# Patient Record
Sex: Male | Born: 1960 | Race: Black or African American | Hispanic: No | Marital: Single | Smoking: Current every day smoker
Health system: Southern US, Community
[De-identification: ages and names within clinical notes are randomized; demographics above are authoritative.]

## PROBLEM LIST (undated history)

## (undated) ENCOUNTER — Ambulatory Visit: Payer: MEDICAID | Attending: Pharmacist | Primary: Pharmacist

## (undated) ENCOUNTER — Telehealth

## (undated) ENCOUNTER — Encounter

## (undated) ENCOUNTER — Encounter
Attending: Student in an Organized Health Care Education/Training Program | Primary: Student in an Organized Health Care Education/Training Program

## (undated) ENCOUNTER — Telehealth: Attending: Clinical | Primary: Clinical

## (undated) ENCOUNTER — Ambulatory Visit: Payer: MEDICAID | Attending: Psychiatry | Primary: Psychiatry

## (undated) ENCOUNTER — Ambulatory Visit

## (undated) ENCOUNTER — Ambulatory Visit: Payer: MEDICAID | Attending: Infectious Disease | Primary: Infectious Disease

## (undated) ENCOUNTER — Ambulatory Visit: Payer: MEDICAID | Attending: Specialist | Primary: Specialist

## (undated) ENCOUNTER — Telehealth: Attending: Family Medicine | Primary: Family Medicine

## (undated) ENCOUNTER — Ambulatory Visit: Payer: MEDICAID | Attending: Family Medicine | Primary: Family Medicine

## (undated) ENCOUNTER — Other Ambulatory Visit

## (undated) ENCOUNTER — Encounter: Attending: Medical | Primary: Medical

## (undated) ENCOUNTER — Ambulatory Visit: Payer: MEDICAID | Attending: Family | Primary: Family

## (undated) ENCOUNTER — Ambulatory Visit: Payer: Medicare (Managed Care)

## (undated) ENCOUNTER — Ambulatory Visit: Payer: MEDICARE

## (undated) ENCOUNTER — Telehealth: Attending: Registered" | Primary: Registered"

## (undated) ENCOUNTER — Ambulatory Visit: Payer: MEDICAID | Attending: Medical | Primary: Medical

## (undated) ENCOUNTER — Encounter: Attending: Audiologist | Primary: Audiologist

## (undated) ENCOUNTER — Telehealth
Attending: Student in an Organized Health Care Education/Training Program | Primary: Student in an Organized Health Care Education/Training Program

## (undated) ENCOUNTER — Ambulatory Visit: Attending: Otolaryngology | Primary: Otolaryngology

## (undated) ENCOUNTER — Ambulatory Visit: Attending: Medical | Primary: Medical

## (undated) ENCOUNTER — Ambulatory Visit
Payer: MEDICARE | Attending: Student in an Organized Health Care Education/Training Program | Primary: Student in an Organized Health Care Education/Training Program

## (undated) ENCOUNTER — Encounter: Attending: Family Medicine | Primary: Family Medicine

## (undated) ENCOUNTER — Telehealth: Attending: Specialist | Primary: Specialist

## (undated) ENCOUNTER — Ambulatory Visit: Payer: MEDICAID | Attending: General Practice | Primary: General Practice

## (undated) ENCOUNTER — Ambulatory Visit: Payer: MEDICARE | Attending: Audiologist | Primary: Audiologist

## (undated) ENCOUNTER — Encounter: Attending: Otolaryngology | Primary: Otolaryngology

## (undated) ENCOUNTER — Encounter: Attending: Clinical | Primary: Clinical

## (undated) ENCOUNTER — Ambulatory Visit: Attending: Family | Primary: Family

## (undated) ENCOUNTER — Encounter: Attending: Specialist | Primary: Specialist

## (undated) ENCOUNTER — Ambulatory Visit: Attending: Pharmacist | Primary: Pharmacist

## (undated) ENCOUNTER — Ambulatory Visit: Payer: MEDICAID

## (undated) ENCOUNTER — Ambulatory Visit
Payer: MEDICAID | Attending: Student in an Organized Health Care Education/Training Program | Primary: Student in an Organized Health Care Education/Training Program

## (undated) ENCOUNTER — Ambulatory Visit: Payer: Medicare (Managed Care) | Attending: Anesthesiology | Primary: Anesthesiology

## (undated) ENCOUNTER — Ambulatory Visit: Attending: Audiologist | Primary: Audiologist

## (undated) ENCOUNTER — Ambulatory Visit: Payer: Medicare (Managed Care) | Attending: Family Medicine | Primary: Family Medicine

## (undated) ENCOUNTER — Ambulatory Visit
Payer: Medicare (Managed Care) | Attending: Student in an Organized Health Care Education/Training Program | Primary: Student in an Organized Health Care Education/Training Program

## (undated) ENCOUNTER — Ambulatory Visit: Attending: Registered" | Primary: Registered"

## (undated) ENCOUNTER — Telehealth: Attending: Medical | Primary: Medical

## (undated) ENCOUNTER — Ambulatory Visit: Payer: MEDICAID | Attending: Emergency Medicine | Primary: Emergency Medicine

## (undated) ENCOUNTER — Ambulatory Visit: Attending: Infectious Disease | Primary: Infectious Disease

## (undated) ENCOUNTER — Ambulatory Visit: Attending: Emergency Medicine | Primary: Emergency Medicine

## (undated) ENCOUNTER — Ambulatory Visit
Attending: Student in an Organized Health Care Education/Training Program | Primary: Student in an Organized Health Care Education/Training Program

## (undated) ENCOUNTER — Ambulatory Visit: Payer: MEDICARE | Attending: Registered" | Primary: Registered"

## (undated) ENCOUNTER — Encounter: Attending: Infectious Disease | Primary: Infectious Disease

## (undated) ENCOUNTER — Ambulatory Visit: Attending: Mental Health | Primary: Mental Health

## (undated) ENCOUNTER — Telehealth: Attending: Pharmacist | Primary: Pharmacist

## (undated) ENCOUNTER — Ambulatory Visit: Attending: Family Medicine | Primary: Family Medicine

## (undated) ENCOUNTER — Telehealth: Attending: Anesthesiology | Primary: Anesthesiology

## (undated) ENCOUNTER — Ambulatory Visit: Payer: MEDICAID | Attending: Clinical | Primary: Clinical

## (undated) ENCOUNTER — Encounter: Attending: Adult Health | Primary: Adult Health

## (undated) DIAGNOSIS — F329 Major depressive disorder, single episode, unspecified: Secondary | ICD-10-CM

## (undated) DIAGNOSIS — R569 Unspecified convulsions: Secondary | ICD-10-CM

## (undated) DIAGNOSIS — M1990 Unspecified osteoarthritis, unspecified site: Secondary | ICD-10-CM

## (undated) DIAGNOSIS — K31A0 Gastric intestinal metaplasia, unspecified: Secondary | ICD-10-CM

## (undated) DIAGNOSIS — K3189 Other diseases of stomach and duodenum: Secondary | ICD-10-CM

## (undated) DIAGNOSIS — D649 Anemia, unspecified: Secondary | ICD-10-CM

## (undated) DIAGNOSIS — D849 Immunodeficiency, unspecified: Secondary | ICD-10-CM

## (undated) DIAGNOSIS — K259 Gastric ulcer, unspecified as acute or chronic, without hemorrhage or perforation: Secondary | ICD-10-CM

## (undated) DIAGNOSIS — G4730 Sleep apnea, unspecified: Secondary | ICD-10-CM

## (undated) DIAGNOSIS — E119 Type 2 diabetes mellitus without complications: Secondary | ICD-10-CM

## (undated) DIAGNOSIS — E785 Hyperlipidemia, unspecified: Secondary | ICD-10-CM

## (undated) DIAGNOSIS — I10 Essential (primary) hypertension: Secondary | ICD-10-CM

## (undated) DIAGNOSIS — B20 Human immunodeficiency virus [HIV] disease: Secondary | ICD-10-CM

## (undated) DIAGNOSIS — Z21 Asymptomatic human immunodeficiency virus [HIV] infection status: Secondary | ICD-10-CM

## (undated) HISTORY — DX: Unspecified convulsions: R56.9

## (undated) HISTORY — PX: OTHER SURGICAL HISTORY: SHX169

## (undated) HISTORY — PX: ABDOMINAL SURGERY: SHX537

---
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
## 2005-06-21 ENCOUNTER — Ambulatory Visit: Payer: Self-pay | Admitting: Family Medicine

## 2008-12-24 ENCOUNTER — Emergency Department: Payer: Self-pay | Admitting: Emergency Medicine

## 2010-07-28 ENCOUNTER — Emergency Department: Payer: Self-pay | Admitting: Unknown Physician Specialty

## 2010-08-03 ENCOUNTER — Emergency Department: Payer: Self-pay | Admitting: Emergency Medicine

## 2010-09-26 DIAGNOSIS — M1710 Unilateral primary osteoarthritis, unspecified knee: Secondary | ICD-10-CM | POA: Insufficient documentation

## 2010-10-19 DIAGNOSIS — E669 Obesity, unspecified: Secondary | ICD-10-CM | POA: Insufficient documentation

## 2011-04-12 DIAGNOSIS — H11009 Unspecified pterygium of unspecified eye: Secondary | ICD-10-CM | POA: Insufficient documentation

## 2012-05-21 DIAGNOSIS — G4730 Sleep apnea, unspecified: Secondary | ICD-10-CM | POA: Insufficient documentation

## 2013-10-01 ENCOUNTER — Emergency Department: Payer: Self-pay | Admitting: Emergency Medicine

## 2013-10-22 ENCOUNTER — Emergency Department: Payer: Self-pay | Admitting: Emergency Medicine

## 2013-11-03 ENCOUNTER — Emergency Department: Payer: Self-pay | Admitting: Emergency Medicine

## 2013-11-03 LAB — CBC
HCT: 40.7 % (ref 40.0–52.0)
HGB: 13.6 g/dL (ref 13.0–18.0)
MCH: 29.3 pg (ref 26.0–34.0)
MCHC: 33.5 g/dL (ref 32.0–36.0)
MCV: 88 fL (ref 80–100)
PLATELETS: 238 10*3/uL (ref 150–440)
RBC: 4.64 10*6/uL (ref 4.40–5.90)
RDW: 15.1 % — ABNORMAL HIGH (ref 11.5–14.5)
WBC: 8.5 10*3/uL (ref 3.8–10.6)

## 2013-11-03 LAB — BASIC METABOLIC PANEL
ANION GAP: 6 — AB (ref 7–16)
BUN: 11 mg/dL (ref 7–18)
CHLORIDE: 108 mmol/L — AB (ref 98–107)
CREATININE: 0.86 mg/dL (ref 0.60–1.30)
Calcium, Total: 8.4 mg/dL — ABNORMAL LOW (ref 8.5–10.1)
Co2: 26 mmol/L (ref 21–32)
EGFR (African American): >60
Glucose: 88 mg/dL (ref 65–99)
OSMOLALITY: 278 (ref 275–301)
POTASSIUM: 3.9 mmol/L (ref 3.5–5.1)
Sodium: 140 mmol/L (ref 136–145)

## 2013-11-03 LAB — TROPONIN I

## 2014-07-08 DIAGNOSIS — F17200 Nicotine dependence, unspecified, uncomplicated: Secondary | ICD-10-CM | POA: Insufficient documentation

## 2014-09-07 DIAGNOSIS — E0590 Thyrotoxicosis, unspecified without thyrotoxic crisis or storm: Secondary | ICD-10-CM | POA: Insufficient documentation

## 2014-09-30 DIAGNOSIS — K6289 Other specified diseases of anus and rectum: Secondary | ICD-10-CM | POA: Insufficient documentation

## 2014-09-30 DIAGNOSIS — M779 Enthesopathy, unspecified: Secondary | ICD-10-CM | POA: Insufficient documentation

## 2014-10-07 DIAGNOSIS — J069 Acute upper respiratory infection, unspecified: Secondary | ICD-10-CM | POA: Insufficient documentation

## 2015-04-18 DIAGNOSIS — R0789 Other chest pain: Secondary | ICD-10-CM | POA: Insufficient documentation

## 2016-01-24 DIAGNOSIS — I10 Essential (primary) hypertension: Secondary | ICD-10-CM

## 2016-01-24 DIAGNOSIS — E1159 Type 2 diabetes mellitus with other circulatory complications: Secondary | ICD-10-CM | POA: Insufficient documentation

## 2016-01-24 DIAGNOSIS — I152 Hypertension secondary to endocrine disorders: Secondary | ICD-10-CM | POA: Insufficient documentation

## 2016-01-24 DIAGNOSIS — E669 Obesity, unspecified: Secondary | ICD-10-CM

## 2016-01-24 DIAGNOSIS — E782 Mixed hyperlipidemia: Secondary | ICD-10-CM | POA: Insufficient documentation

## 2016-01-24 DIAGNOSIS — E1169 Type 2 diabetes mellitus with other specified complication: Secondary | ICD-10-CM | POA: Insufficient documentation

## 2017-01-23 MED ORDER — NAPROXEN 500 MG TABLET,DELAYED RELEASE
ORAL_TABLET | 0 refills | 0 days | Status: CP
Start: 2017-01-23 — End: 2017-02-04

## 2017-02-04 MED ORDER — NAPROXEN 500 MG TABLET,DELAYED RELEASE
ORAL_TABLET | Freq: Two times a day (BID) | ORAL | 0 refills | 0.00000 days | Status: CP
Start: 2017-02-04 — End: 2017-04-16

## 2017-03-31 MED ORDER — LISINOPRIL 10 MG TABLET
ORAL_TABLET | 0 refills | 0 days | Status: CP
Start: 2017-03-31 — End: 2017-04-16

## 2017-03-31 MED ORDER — METFORMIN 500 MG TABLET
ORAL_TABLET | 0 refills | 0 days | Status: CP
Start: 2017-03-31 — End: 2017-04-16

## 2017-03-31 MED ORDER — ATORVASTATIN 40 MG TABLET
ORAL_TABLET | 0 refills | 0 days | Status: CP
Start: 2017-03-31 — End: 2017-04-16

## 2017-04-16 ENCOUNTER — Ambulatory Visit
Admission: RE | Admit: 2017-04-16 | Discharge: 2017-04-16 | Disposition: A | Discharge status: Home / Self Care | Payer: BC Managed Care – PPO | Attending: Family Medicine | Admitting: Family Medicine

## 2017-04-16 DIAGNOSIS — I10 Essential (primary) hypertension: Secondary | ICD-10-CM

## 2017-04-16 DIAGNOSIS — E782 Mixed hyperlipidemia: Secondary | ICD-10-CM

## 2017-04-16 DIAGNOSIS — Z23 Encounter for immunization: Secondary | ICD-10-CM

## 2017-04-16 DIAGNOSIS — E119 Type 2 diabetes mellitus without complications: Principal | ICD-10-CM

## 2017-04-16 DIAGNOSIS — E1159 Type 2 diabetes mellitus with other circulatory complications: Secondary | ICD-10-CM

## 2017-04-16 DIAGNOSIS — Z113 Encounter for screening for infections with a predominantly sexual mode of transmission: Secondary | ICD-10-CM

## 2017-04-16 DIAGNOSIS — E785 Hyperlipidemia, unspecified: Secondary | ICD-10-CM

## 2017-04-16 DIAGNOSIS — M1712 Unilateral primary osteoarthritis, left knee: Secondary | ICD-10-CM

## 2017-04-16 DIAGNOSIS — E1169 Type 2 diabetes mellitus with other specified complication: Secondary | ICD-10-CM

## 2017-04-16 MED ORDER — NAPROXEN 500 MG TABLET,DELAYED RELEASE
ORAL_TABLET | Freq: Two times a day (BID) | ORAL | 1 refills | 0 days | Status: CP
Start: 2017-04-16 — End: 2017-06-06

## 2017-04-16 MED ORDER — ATORVASTATIN 40 MG TABLET
ORAL_TABLET | Freq: Every day | ORAL | 1 refills | 0.00000 days | Status: CP
Start: 2017-04-16 — End: 2017-09-15

## 2017-04-16 MED ORDER — METFORMIN 500 MG TABLET
ORAL_TABLET | Freq: Every day | ORAL | 1 refills | 0.00000 days | Status: CP
Start: 2017-04-16 — End: 2017-09-15

## 2017-04-16 MED ORDER — LISINOPRIL 20 MG TABLET
ORAL_TABLET | Freq: Every day | ORAL | 3 refills | 0.00000 days | Status: CP
Start: 2017-04-16 — End: 2017-09-15

## 2017-04-16 MED ORDER — LISINOPRIL 10 MG TABLET
ORAL_TABLET | Freq: Every day | ORAL | 1 refills | 0 days | Status: CP
Start: 2017-04-16 — End: 2017-04-16

## 2017-04-30 ENCOUNTER — Ambulatory Visit
Admission: RE | Admit: 2017-04-30 | Discharge: 2017-04-30 | Disposition: A | Discharge status: Home / Self Care | Payer: BC Managed Care – PPO | Attending: Family Medicine | Admitting: Family Medicine

## 2017-04-30 DIAGNOSIS — Z21 Asymptomatic human immunodeficiency virus [HIV] infection status: Secondary | ICD-10-CM | POA: Insufficient documentation

## 2017-04-30 DIAGNOSIS — E1159 Type 2 diabetes mellitus with other circulatory complications: Secondary | ICD-10-CM

## 2017-04-30 DIAGNOSIS — I10 Essential (primary) hypertension: Secondary | ICD-10-CM

## 2017-04-30 DIAGNOSIS — E0590 Thyrotoxicosis, unspecified without thyrotoxic crisis or storm: Secondary | ICD-10-CM

## 2017-05-09 ENCOUNTER — Ambulatory Visit: Admit: 2017-05-09 | Discharge: 2017-05-09 | Payer: MEDICAID | Attending: Specialist | Primary: Specialist

## 2017-05-09 ENCOUNTER — Ambulatory Visit: Admit: 2017-05-09 | Discharge: 2017-05-09 | Payer: MEDICAID | Attending: Clinical | Primary: Clinical

## 2017-05-09 ENCOUNTER — Ambulatory Visit: Admit: 2017-05-09 | Discharge: 2017-05-09 | Payer: MEDICAID

## 2017-05-09 DIAGNOSIS — R079 Chest pain, unspecified: Secondary | ICD-10-CM

## 2017-05-09 DIAGNOSIS — Z21 Asymptomatic human immunodeficiency virus [HIV] infection status: Principal | ICD-10-CM

## 2017-05-18 MED ORDER — BICTEGRAVIR 50 MG-EMTRICITABINE 200 MG-TENOFOVIR ALAFENAM 25 MG TABLET
ORAL_TABLET | Freq: Every day | ORAL | 5 refills | 0 days | Status: CP
Start: 2017-05-18 — End: 2017-09-09

## 2017-05-19 MED ORDER — RANITIDINE 150 MG CAPSULE
ORAL_CAPSULE | Freq: Every evening | ORAL | 3 refills | 0 days | Status: CP
Start: 2017-05-19 — End: 2017-06-11

## 2017-05-22 ENCOUNTER — Ambulatory Visit: Admit: 2017-05-22 | Discharge: 2017-05-23 | Payer: MEDICAID

## 2017-05-22 DIAGNOSIS — R079 Chest pain, unspecified: Principal | ICD-10-CM

## 2017-05-23 ENCOUNTER — Inpatient Hospital Stay
Admission: EM | Admit: 2017-05-23 | Discharge: 2017-05-26 | DRG: 377 | Disposition: A | Discharge status: Home / Self Care | Payer: BLUE CROSS/BLUE SHIELD | Attending: Internal Medicine | Admitting: Internal Medicine

## 2017-05-23 ENCOUNTER — Other Ambulatory Visit: Payer: Self-pay

## 2017-05-23 DIAGNOSIS — Z7982 Long term (current) use of aspirin: Secondary | ICD-10-CM

## 2017-05-23 DIAGNOSIS — K922 Gastrointestinal hemorrhage, unspecified: Secondary | ICD-10-CM | POA: Diagnosis present

## 2017-05-23 DIAGNOSIS — K250 Acute gastric ulcer with hemorrhage: Secondary | ICD-10-CM | POA: Diagnosis present

## 2017-05-23 DIAGNOSIS — R579 Shock, unspecified: Secondary | ICD-10-CM

## 2017-05-23 DIAGNOSIS — K921 Melena: Secondary | ICD-10-CM | POA: Diagnosis present

## 2017-05-23 DIAGNOSIS — R578 Other shock: Secondary | ICD-10-CM | POA: Diagnosis present

## 2017-05-23 DIAGNOSIS — E782 Mixed hyperlipidemia: Secondary | ICD-10-CM | POA: Diagnosis present

## 2017-05-23 DIAGNOSIS — Z7984 Long term (current) use of oral hypoglycemic drugs: Secondary | ICD-10-CM

## 2017-05-23 DIAGNOSIS — Z21 Asymptomatic human immunodeficiency virus [HIV] infection status: Secondary | ICD-10-CM | POA: Diagnosis present

## 2017-05-23 DIAGNOSIS — F17210 Nicotine dependence, cigarettes, uncomplicated: Secondary | ICD-10-CM | POA: Diagnosis present

## 2017-05-23 DIAGNOSIS — D500 Iron deficiency anemia secondary to blood loss (chronic): Secondary | ICD-10-CM | POA: Diagnosis present

## 2017-05-23 DIAGNOSIS — K068 Other specified disorders of gingiva and edentulous alveolar ridge: Secondary | ICD-10-CM | POA: Diagnosis present

## 2017-05-23 DIAGNOSIS — Z23 Encounter for immunization: Secondary | ICD-10-CM | POA: Diagnosis not present

## 2017-05-23 DIAGNOSIS — E119 Type 2 diabetes mellitus without complications: Secondary | ICD-10-CM | POA: Diagnosis present

## 2017-05-23 DIAGNOSIS — I10 Essential (primary) hypertension: Secondary | ICD-10-CM | POA: Diagnosis present

## 2017-05-23 HISTORY — DX: Type 2 diabetes mellitus without complications: E11.9

## 2017-05-23 HISTORY — DX: Asymptomatic human immunodeficiency virus (hiv) infection status: Z21

## 2017-05-23 HISTORY — DX: Hyperlipidemia, unspecified: E78.5

## 2017-05-23 HISTORY — DX: Essential (primary) hypertension: I10

## 2017-05-23 HISTORY — DX: Human immunodeficiency virus (HIV) disease: B20

## 2017-05-23 HISTORY — DX: Immunodeficiency, unspecified: D84.9

## 2017-05-23 LAB — COMPREHENSIVE METABOLIC PANEL
ALT: 13 U/L — AB (ref 17–63)
ANION GAP: 7 (ref 5–15)
AST: 19 U/L (ref 15–41)
Albumin: 3.3 g/dL — ABNORMAL LOW (ref 3.5–5.0)
Alkaline Phosphatase: 37 U/L — ABNORMAL LOW (ref 38–126)
BILIRUBIN TOTAL: 1.2 mg/dL (ref 0.3–1.2)
BUN: 67 mg/dL — ABNORMAL HIGH (ref 6–20)
CALCIUM: 8 mg/dL — AB (ref 8.9–10.3)
CO2: 22 mmol/L (ref 22–32)
CREATININE: 0.99 mg/dL (ref 0.61–1.24)
Chloride: 111 mmol/L (ref 101–111)
Glucose, Bld: 146 mg/dL — ABNORMAL HIGH (ref 65–99)
Potassium: 4.2 mmol/L (ref 3.5–5.1)
Sodium: 140 mmol/L (ref 135–145)
TOTAL PROTEIN: 6.4 g/dL — AB (ref 6.5–8.1)

## 2017-05-23 LAB — CBC WITH DIFFERENTIAL/PLATELET
BASOS PCT: 0 %
Basophils Absolute: 0 10*3/uL (ref 0–0.1)
EOS ABS: 0 10*3/uL (ref 0–0.7)
Eosinophils Relative: 0 %
HCT: 30.2 % — ABNORMAL LOW (ref 40.0–52.0)
Hemoglobin: 10.1 g/dL — ABNORMAL LOW (ref 13.0–18.0)
LYMPHS ABS: 2.5 10*3/uL (ref 1.0–3.6)
Lymphocytes Relative: 36 %
MCH: 30.5 pg (ref 26.0–34.0)
MCHC: 33.4 g/dL (ref 32.0–36.0)
MCV: 91.2 fL (ref 80.0–100.0)
Monocytes Absolute: 0.6 10*3/uL (ref 0.2–1.0)
Monocytes Relative: 8 %
Neutro Abs: 3.9 10*3/uL (ref 1.4–6.5)
Neutrophils Relative %: 56 %
Platelets: 165 10*3/uL (ref 150–440)
RBC: 3.31 MIL/uL — AB (ref 4.40–5.90)
RDW: 14.4 % (ref 11.5–14.5)
WBC: 7 10*3/uL (ref 3.8–10.6)

## 2017-05-23 LAB — GLUCOSE, CAPILLARY: Glucose-Capillary: 116 mg/dL — ABNORMAL HIGH (ref 65–99)

## 2017-05-23 LAB — ABO/RH: ABO/RH(D): O POS

## 2017-05-23 LAB — PROTIME-INR
INR: 1.2
Prothrombin Time: 15.1 seconds (ref 11.4–15.2)

## 2017-05-23 LAB — HEMOGLOBIN: HEMOGLOBIN: 11.2 g/dL — AB (ref 13.0–18.0)

## 2017-05-23 LAB — LIPASE, BLOOD: Lipase: 18 U/L (ref 11–51)

## 2017-05-23 MED ORDER — PNEUMOCOCCAL VAC POLYVALENT 25 MCG/0.5ML IJ INJ
0.5000 mL | INJECTION | INTRAMUSCULAR | Status: AC
  Administered 2017-05-24: 0.5 mL via INTRAMUSCULAR
  Filled 2017-05-23 (×2): qty 0.5

## 2017-05-23 MED ORDER — SODIUM CHLORIDE 0.9 % IV SOLN
80.0000 mg | Freq: Once | INTRAVENOUS | Status: AC
  Administered 2017-05-23: 80 mg via INTRAVENOUS
  Filled 2017-05-23: qty 80

## 2017-05-23 MED ORDER — INSULIN ASPART 100 UNIT/ML ~~LOC~~ SOLN
0.0000 [IU] | Freq: Three times a day (TID) | SUBCUTANEOUS | Status: DC
  Administered 2017-05-24: 1 [IU] via SUBCUTANEOUS
  Filled 2017-05-23: qty 1

## 2017-05-23 MED ORDER — ONDANSETRON HCL 4 MG PO TABS: 4.0000 mg | ORAL_TABLET | Freq: Four times a day (QID) | ORAL | Status: DC | PRN

## 2017-05-23 MED ORDER — SODIUM CHLORIDE 0.9 % IV SOLN
8.0000 mg/h | INTRAVENOUS | Status: DC
  Administered 2017-05-23 – 2017-05-25 (×4): 8 mg/h via INTRAVENOUS
  Filled 2017-05-23 (×4): qty 80

## 2017-05-23 MED ORDER — ACETAMINOPHEN 650 MG RE SUPP: 650.0000 mg | Freq: Four times a day (QID) | RECTAL | Status: DC | PRN

## 2017-05-23 MED ORDER — ACETAMINOPHEN 325 MG PO TABS
650.0000 mg | ORAL_TABLET | Freq: Four times a day (QID) | ORAL | Status: DC | PRN
  Administered 2017-05-24: 650 mg via ORAL
  Filled 2017-05-23: qty 2

## 2017-05-23 MED ORDER — SODIUM CHLORIDE 0.9 % IV SOLN
10.0000 mL/h | Freq: Once | INTRAVENOUS | Status: AC
  Administered 2017-05-23: 10 mL/h via INTRAVENOUS

## 2017-05-23 MED ORDER — PANTOPRAZOLE SODIUM 40 MG IV SOLR: 40.0000 mg | Freq: Two times a day (BID) | INTRAVENOUS | Status: DC

## 2017-05-23 MED ORDER — SODIUM CHLORIDE 0.9 % IV SOLN: INTRAVENOUS | Status: DC

## 2017-05-23 MED ORDER — SODIUM CHLORIDE 0.9 % IV BOLUS (SEPSIS)
1000.0000 mL | Freq: Once | INTRAVENOUS | Status: AC
  Administered 2017-05-23: 1000 mL via INTRAVENOUS

## 2017-05-23 MED ORDER — ONDANSETRON HCL 4 MG/2ML IJ SOLN: 4.0000 mg | Freq: Four times a day (QID) | INTRAMUSCULAR | Status: DC | PRN

## 2017-05-23 MED ORDER — SODIUM CHLORIDE 0.9 % IV SOLN
INTRAVENOUS | Status: DC
  Administered 2017-05-23 – 2017-05-25 (×4): via INTRAVENOUS

## 2017-05-23 MED ORDER — ONDANSETRON HCL 4 MG/2ML IJ SOLN
4.0000 mg | Freq: Once | INTRAMUSCULAR | Status: AC
  Administered 2017-05-23: 4 mg via INTRAVENOUS
  Filled 2017-05-23: qty 2

## 2017-05-23 MED ORDER — PANTOPRAZOLE SODIUM 40 MG IV SOLR
40.0000 mg | Freq: Once | INTRAVENOUS | Status: AC
  Administered 2017-05-23: 40 mg via INTRAVENOUS
  Filled 2017-05-23: qty 40

## 2017-05-23 NOTE — ED Notes (Signed)
Second unit of emergency blood started at 185 mL/hr per Dr. Scotty CourtStafford verbal order.

## 2017-05-23 NOTE — ED Notes (Signed)
Second Unit of Emergency Blood Stopped

## 2017-05-23 NOTE — ED Triage Notes (Signed)
EMS reports patient called after feeling bad for two days, pt confirms bloody stool last two days.

## 2017-05-23 NOTE — H&P (Addendum)
[facility] [place] at  [facility]   PATIENT NAME: Patrick Galloway Tutton    MR#:  [id number]  DATE OF BIRTH:  [birth year]  DATE OF ADMISSION:  05/23/2017  PRIMARY CARE PHYSICIAN: No primary care provider on file.   REQUESTING/REFERRING PHYSICIAN: Alfonse FlavorsStafford Philip MD  CHIEF COMPLAINT:   Chief Complaint  Patient presents with  . Rectal Bleeding    Patient stated he has had bloody stool for 2 days, HIV +    HISTORY OF PRESENT ILLNESS: Patrick Galloway Deroy  is a 57 y.o. male with a known history of HIV, essential hypertension, hyperlipidemia, diabetes type 2 who is presenting with dark tarry stools for the past 2 days.  Patient states that he started having some abdominal cramping a few days ago and then the symptoms started.  Patient does take naproxen 2 times a day.  He denies taking any other NSAIDs.  He does not have any history of GI bleed in the past.  He denies any chest pain shortness of breath nausea or vomiting On arrival to the ED patient was hypotensive given 2 units of blood.   PAST MEDICAL HISTORY:   Past Medical History:  Diagnosis Date  . DM2 (diabetes mellitus, type 2) (HCC)   . Hyperlipemia   . Hypertension   . Immune deficiency disorder (HCC)    PT reports HIV    PAST SURGICAL HISTORY:  Past Surgical History:  Procedure Laterality Date  . none      SOCIAL HISTORY:  Social History   Tobacco Use  . Smoking status: Current Every Day Smoker    Packs/day: 0.50    Years: 25.00    Pack years: 12.50  . Smokeless tobacco: Never Used  Substance Use Topics  . Alcohol use: No    Frequency: Never    FAMILY HISTORY:  Family History  Problem Relation Age of Onset  . Heart failure Mother   . Heart failure Father     DRUG ALLERGIES: Not on File  REVIEW OF SYSTEMS:   CONSTITUTIONAL: No fever, fatigue or weakness.  EYES: No blurred or double vision.  EARS, NOSE, AND THROAT: No tinnitus or ear pain.  RESPIRATORY: No cough, shortness of breath, wheezing or  hemoptysis.  CARDIOVASCULAR: No chest pain, orthopnea, edema.  GASTROINTESTINAL: No nausea, vomiting, diarrhea or abdominal pain.  Positive GI bleeding GENITOURINARY: No dysuria, hematuria.  ENDOCRINE: No polyuria, nocturia,  HEMATOLOGY: No anemia, easy bruising or bleeding SKIN: No rash or lesion. MUSCULOSKELETAL: No joint pain or arthritis.   NEUROLOGIC: No tingling, numbness, weakness.  PSYCHIATRY: No anxiety or depression.   MEDICATIONS AT HOME:  Prior to Admission medications   Medication Sig Start Date End Date Taking? Authorizing Provider  Aspirin (ASPIR-81 PO) Take 81 mg by mouth daily. 11/03/12  Yes [provider]  atorvastatin (LIPITOR) 40 MG tablet Take 1 tablet by mouth daily. 05/08/17  Yes [provider]  BIKTARVY 50-200-25 MG TABS tablet Take 1 tablet by mouth daily. 05/20/17  Yes [provider]  lisinopril (PRINIVIL,ZESTRIL) 20 MG tablet Take 1 tablet by mouth daily. 05/08/17  Yes [provider]  metFORMIN (GLUCOPHAGE) 500 MG tablet Take 1 tablet by mouth daily. 05/08/17  Yes [provider]  NAPROXEN DR 500 MG EC tablet Take 1 tablet by mouth 2 (two) times daily. 05/08/17  Yes [provider]  ranitidine (ZANTAC) 150 MG tablet Take 1 tablet by mouth daily. 05/19/17  Yes [provider]      PHYSICAL EXAMINATION:  VITAL SIGNS: Blood pressure (!) 79/49, pulse 89, temperature 98.3 F (36.8 C), temperature source Oral, resp. rate (!) 22, height 6\' 4"  (1.93 m), weight 250 lb (113.4 kg), SpO2 100 %.  GENERAL:  57 y.o.-year-old patient lying in the bed with no acute distress.  EYES: Pupils equal, round, reactive to light and accommodation. No scleral icterus. Extraocular muscles intact.  HEENT: Head atraumatic, normocephalic. Oropharynx and nasopharynx clear.  NECK:  Supple, no jugular venous distention. No thyroid enlargement, no tenderness.  LUNGS: Normal breath sounds bilaterally, no wheezing, rales,rhonchi or  crepitation. No use of accessory muscles of respiration.  CARDIOVASCULAR: S1, S2 normal. No murmurs, rubs, or gallops.  ABDOMEN: Soft, nontender, nondistended. Bowel sounds present. No organomegaly or mass.  EXTREMITIES: No pedal edema, cyanosis, or clubbing.  NEUROLOGIC: Cranial nerves II through XII are intact. Muscle strength 5/5 in all extremities. Sensation intact. Gait not checked.  PSYCHIATRIC: The patient is alert and oriented x 3.  SKIN: No obvious rash, lesion, or ulcer.   LABORATORY PANEL:   CBC Recent Labs  Lab 05/23/17 1523  WBC 7.0  HGB 10.1*  HCT 30.2*  PLT 165  MCV 91.2  MCH 30.5  MCHC 33.4  RDW 14.4  LYMPHSABS 2.5  MONOABS 0.6  EOSABS 0.0  BASOSABS 0.0   ------------------------------------------------------------------------------------------------------------------  Chemistries  Recent Labs  Lab 05/23/17 1523  NA 140  K 4.2  CL 111  CO2 22  GLUCOSE 146*  BUN 67*  CREATININE 0.99  CALCIUM 8.0*  AST 19  ALT 13*  ALKPHOS 37*  BILITOT 1.2   ------------------------------------------------------------------------------------------------------------------ estimated creatinine clearance is 113.4 mL/min (by C-G formula based on SCr of 0.99 mg/dL). ------------------------------------------------------------------------------------------------------------------ No results for input(s): TSH, T4TOTAL, T3FREE, THYROIDAB in the last 72 hours.  Invalid input(s): FREET3   Coagulation profile Recent Labs  Lab 05/23/17 1523  INR 1.20   ------------------------------------------------------------------------------------------------------------------- No results for input(s): DDIMER in the last 72 hours. -------------------------------------------------------------------------------------------------------------------  Cardiac Enzymes No results for input(s): CKMB, TROPONINI, MYOGLOBIN in the last 168 hours.  Invalid input(s):  CK ------------------------------------------------------------------------------------------------------------------ Invalid input(s): POCBNP  ---------------------------------------------------------------------------------------------------------------  Urinalysis No results found for: COLORURINE, APPEARANCEUR, LABSPEC, PHURINE, GLUCOSEU, HGBUR, BILIRUBINUR, KETONESUR, PROTEINUR, UROBILINOGEN, NITRITE, LEUKOCYTESUR   RADIOLOGY: No results found.  EKG: No orders found for this or any previous visit.  IMPRESSION AND PLAN: Patient is 57 year old with history of HIV presenting with dark tarry stools  1.  Upper GI bleed Patient is receiving 2 units of packed RBCs due to hypotension now blood pressure normal We will check hemoglobin every 6 hours transfuse as needed I will give patient Protonix IV bolus followed with continuous IV drip I have updated the GI physician Dr. Servando Snare he will see patient in am  2.  Hypotension due to #1  3.  HIV I will hold his HIV meds for now  4.  Diabetes type 2 we will place on sliding scale insulin every 6 hours hold oral metformin  5.  History of hypertension hold blood pressure meds due to #2  6.  Hyperlipidemia we will hold his oral meds for now  7.  Miscellaneous SCDs for DVT prophylaxis  All the records are reviewed and case discussed with ED provider. Management plans discussed with the patient, family and they are in agreement.  CODE STATUS: Code Status History    This patient does not have a recorded code status. Please follow your organizational policy for patients in this situation.       TOTAL TIME TAKING CARE OF  THIS PATIENT55 minutes.    Auburn Bilberry M.D on 05/23/2017 at 5:39 PM  Between 7am to 6pm - Pager - [phone]  After 6pm go to [email or web address] - password [email or web address] [facility]  [place] [facility]  Office  [phone]  CC: Primary care physician; No primary care provider on file.

## 2017-05-23 NOTE — ED Provider Notes (Addendum)
[facility] Emergency Department Provider Note  ____________________________________________  Time seen: Approximately 3:26 PM  I have reviewed the triage vital signs and the nursing notes.   HISTORY  Chief Complaint generalized weakness   HPI Patrick Galloway is a 57 y.o. male who complains of generalized weakness and feeling lightheaded like he is about to pass out that onset today. Constant, moderate to severe. He does note that he's been having runny black stool with frequent loose bowel movements about every 2 hours for the past 4 days.  patient reports a history of HIV, unknown viral load, unknown CD4, recently started a new antiretroviral 2 days ago. review of electronic medical record shows he was diagnosed 04/16/2017 with HIV. ID referral in progress.   Past Medical History:  Diagnosis Date  . Immune deficiency disorder (HCC)    PT reports HIV   HIV diabetes Hyperlipidemia  There are no active problems to display for this patient.    no past surgical history   Prior to Admission medications   Medication Sig Start Date End Date Taking? Authorizing Provider  Aspirin (ASPIR-81 PO) Take 81 mg by mouth daily. 11/03/12  Yes [provider]  atorvastatin (LIPITOR) 40 MG tablet Take 1 tablet by mouth daily. 05/08/17  Yes [provider]  BIKTARVY 50-200-25 MG TABS tablet Take 1 tablet by mouth daily. 05/20/17  Yes [provider]  lisinopril (PRINIVIL,ZESTRIL) 20 MG tablet Take 1 tablet by mouth daily. 05/08/17  Yes [provider]  metFORMIN (GLUCOPHAGE) 500 MG tablet Take 1 tablet by mouth daily. 05/08/17  Yes [provider]  NAPROXEN DR 500 MG EC tablet Take 1 tablet by mouth 2 (two) times daily. 05/08/17  Yes [provider]  ranitidine (ZANTAC) 150 MG tablet Take 1 tablet by mouth daily. 05/19/17  Yes [provider]  Current Medications   Prescription Sig. Disp. Refills Start Date End  Date Status  aspirin (ECOTRIN) 81 MG tablet  Take 1 tablet (81 mg total) by mouth daily. 150 tablet  2 11/03/2012  Active  CONTOUR NEXT STRIPS Strp  CHECK BLOOD SUGAR IN THE MORNING & THE EVENING (BEFORE EATING) 100 each  2 06/29/2013  Active  fluticasone (FLONASE) 50 mcg/actuation nasal spray  Indications: Allergic rhinitis, unspecified allergic rhinitis type 2 sprays by Each Nare route daily. 16 g  1 06/17/2014  Active  multivitamin (THERAGRAN) per tablet  Take 1 tablet by mouth daily.     Active  naproxen (NAPROSYN) 500 MG tablet  Take 500 mg by mouth 2 (two) times a day with meals.     Active  atorvastatin (LIPITOR) 40 MG tablet  Indications: Mixed hyperlipidemia Take 1 tablet (40 mg total) by mouth daily. 90 tablet  1 04/16/2017  Active  metFORMIN (GLUCOPHAGE) 500 MG tablet  Indications: Type 2 diabetes mellitus without complication, without long-term current use of insulin (CMS-HCC) Take 1 tablet (500 mg total) by mouth daily with breakfast. 90 tablet  1 04/16/2017  Active  naproxen (EC NAPROSYN) 500 MG EC tablet  Take 1 tablet (500 mg total) by mouth 2 (two) times a day with meals. 180 tablet  1 04/16/2017  Active  lisinopril (PRINIVIL,ZESTRIL) 20 MG tablet  Indications: Essential hypertension Take 1 tablet (20 mg total) by mouth daily. 90 tablet  3 04/16/2017  Active  bictegrav-emtricit-tenofov ala (BIKTARVY) 50-200-25 mg tablet  Take 1 tablet by mouth daily. 30 tablet  5 05/18/2017  Active  ranitidine (ZANTAC) 150 MG capsule  Take  1 capsule (150 mg total) by mouth every evening.           Allergies Patient has no allergy information on record.   History reviewed. No pertinent family history.  Social History Social History   Tobacco Use  . Smoking status: Current Every Day Smoker    Packs/day: 0.50    Years: 25.00    Pack years: 12.50  . Smokeless tobacco: Never Used  Substance Use Topics  . Alcohol use: No    Frequency: Never  . Drug  use: No  no alcohol or drug use  Review of Systems  Constitutional:   No fever or chills.   Cardiovascular:   No chest pain or syncope. Respiratory:   No dyspnea or cough. Gastrointestinal:   Negative for abdominal pain, positive runny black stool Musculoskeletal:   Negative for focal pain or swelling All other systems reviewed and are negative except as documented above in ROS and HPI.  ____________________________________________   PHYSICAL EXAM:  VITAL SIGNS: ED Triage Vitals  Enc Vitals Group     BP      Pulse      Resp      Temp      Temp src      SpO2      Weight      Height      Head Circumference      Peak Flow      Pain Score      Pain Loc      Pain Edu?      Excl. in GC?     Vital signs reviewed, nursing assessments reviewed.   Constitutional:   Alert and oriented. Well appearing and in no distress. Eyes:   No scleral icterus.  EOMI. No nystagmus. No conjunctival pallor. PERRL. ENT   Head:   Normocephalic and atraumatic.   Nose:   No congestion/rhinnorhea.    Mouth/Throat:   dry mucous membranes, no pharyngeal erythema. No peritonsillar mass. no thrush   Neck:   No meningismus. Full ROM. Hematological/Lymphatic/Immunilogical:   No cervical lymphadenopathy. Cardiovascular:   tachycardia heart rate 110. Symmetric bilateral radial and DP pulses.  No murmurs.  Respiratory:   Normal respiratory effort without tachypnea/retractions. Breath sounds are clear and equal bilaterally. No wheezes/rales/rhonchi. Gastrointestinal:   Soft with mild lower abdominal tenderness. Non distended. There is no CVA tenderness.  No rebound, rigidity, or guarding.rectal exam reveals copious melanotic stool. Hemoccult positive Genitourinary:   unremarkable Musculoskeletal:   Normal range of motion in all extremities. No joint effusions.  No lower extremity tenderness.  No edema. Neurologic:   Normal speech and language.  Motor grossly intact. No acute focal  neurologic deficits are appreciated.  Skin:    Skin is warm, dry and intact. No rash noted.  No petechiae, purpura, or bullae.  ____________________________________________    LABS (pertinent positives/negatives) (all labs ordered are listed, but only abnormal results are displayed) Labs Reviewed  COMPREHENSIVE METABOLIC PANEL - Abnormal; Notable for the following components:      Result Value   Glucose, Bld 146 (*)    BUN 67 (*)    Calcium 8.0 (*)    Total Protein 6.4 (*)    Albumin 3.3 (*)    ALT 13 (*)    Alkaline Phosphatase 37 (*)    All other components within normal limits  CBC WITH DIFFERENTIAL/PLATELET - Abnormal; Notable for the following components:   RBC 3.31 (*)    Hemoglobin 10.1 (*)  HCT 30.2 (*)    All other components within normal limits  GLUCOSE, CAPILLARY - Abnormal; Notable for the following components:   Glucose-Capillary 116 (*)    All other components within normal limits  LIPASE, BLOOD  PROTIME-INR  PREPARE RBC (CROSSMATCH)  TYPE AND SCREEN  ABO/RH   ____________________________________________   EKG    ____________________________________________    RADIOLOGY  No results found.  ____________________________________________   PROCEDURES .Critical Care Performed by: Sharman Cheek, MD Authorized by: Sharman Cheek, MD   Critical care provider statement:    Critical care time (minutes):  40   Critical care time was exclusive of:  Separately billable procedures and treating other patients   Critical care was necessary to treat or prevent imminent or life-threatening deterioration of the following conditions:  Circulatory failure and shock   Critical care was time spent personally by me on the following activities:  Development of treatment plan with patient or surrogate, discussions with consultants, evaluation of patient's response to treatment, examination of patient, obtaining history from patient or surrogate, ordering and  performing treatments and interventions, ordering and review of laboratory studies, pulse oximetry, re-evaluation of patient's condition and review of old charts     ____________________________________________    CLINICAL IMPRESSION / ASSESSMENT AND PLAN / ED COURSE  Pertinent labs & imaging results that were available during my care of the patient were reviewed by me and considered in my medical decision making (see chart for details).     Clinical Course as of May 23 1705  Fri May 23, 2017  1527 Pt p/w gi bleed. Ppi. Emergent blood transfusion. Plan to admit  [PS]  1652 BP now 105/60. Improving with ivf/transfusion.   [PS]    Clinical Course User Index [PS] Sharman Cheek, MD    ----------------------------------------- 5:11 PM on 05/23/2017 -----------------------------------------  Discussed with hospitalist   ____________________________________________   FINAL CLINICAL IMPRESSION(S) / ED DIAGNOSES    Final diagnoses:  Acute GI bleeding  Shock (HCC)       Portions of this note were generated with dragon dictation software. Dictation errors may occur despite best attempts at proofreading.    Sharman Cheek, MD 05/23/17 1711    Sharman Cheek, MD 06/02/17 2049

## 2017-05-24 ENCOUNTER — Inpatient Hospital Stay: Payer: BLUE CROSS/BLUE SHIELD | Admitting: Anesthesiology

## 2017-05-24 ENCOUNTER — Encounter
Admission: EM | Disposition: A | Discharge status: Home / Self Care | Payer: Self-pay | Source: Home / Self Care | Attending: Family Medicine

## 2017-05-24 ENCOUNTER — Encounter: Payer: Self-pay | Admitting: Anesthesiology

## 2017-05-24 DIAGNOSIS — K922 Gastrointestinal hemorrhage, unspecified: Secondary | ICD-10-CM

## 2017-05-24 DIAGNOSIS — K250 Acute gastric ulcer with hemorrhage: Secondary | ICD-10-CM

## 2017-05-24 DIAGNOSIS — K921 Melena: Secondary | ICD-10-CM

## 2017-05-24 HISTORY — PX: ESOPHAGOGASTRODUODENOSCOPY (EGD) WITH PROPOFOL: SHX5813

## 2017-05-24 LAB — CBC
HEMATOCRIT: 27.6 % — AB (ref 40.0–52.0)
Hemoglobin: 9.4 g/dL — ABNORMAL LOW (ref 13.0–18.0)
MCH: 30.1 pg (ref 26.0–34.0)
MCHC: 34 g/dL (ref 32.0–36.0)
MCV: 88.6 fL (ref 80.0–100.0)
Platelets: 125 10*3/uL — ABNORMAL LOW (ref 150–440)
RBC: 3.11 MIL/uL — AB (ref 4.40–5.90)
RDW: 14.7 % — ABNORMAL HIGH (ref 11.5–14.5)
WBC: 8.4 10*3/uL (ref 3.8–10.6)

## 2017-05-24 LAB — BASIC METABOLIC PANEL
ANION GAP: 6 (ref 5–15)
BUN: 36 mg/dL — ABNORMAL HIGH (ref 6–20)
CO2: 21 mmol/L — ABNORMAL LOW (ref 22–32)
Calcium: 7.8 mg/dL — ABNORMAL LOW (ref 8.9–10.3)
Chloride: 115 mmol/L — ABNORMAL HIGH (ref 101–111)
Creatinine, Ser: 0.9 mg/dL (ref 0.61–1.24)
GFR calc Af Amer: >60 mL/min (ref >60)
GLUCOSE: 92 mg/dL (ref 65–99)
POTASSIUM: 3.7 mmol/L (ref 3.5–5.1)
Sodium: 142 mmol/L (ref 135–145)

## 2017-05-24 LAB — GLUCOSE, CAPILLARY
GLUCOSE-CAPILLARY: 90 mg/dL (ref 65–99)
GLUCOSE-CAPILLARY: 127 mg/dL — AB (ref 65–99)
Glucose-Capillary: 118 mg/dL — ABNORMAL HIGH (ref 65–99)

## 2017-05-24 LAB — HEMOGLOBIN
HEMOGLOBIN: 8.5 g/dL — AB (ref 13.0–18.0)
Hemoglobin: 8.6 g/dL — ABNORMAL LOW (ref 13.0–18.0)

## 2017-05-24 SURGERY — ESOPHAGOGASTRODUODENOSCOPY (EGD) WITH PROPOFOL
Anesthesia: General

## 2017-05-24 MED ORDER — ONDANSETRON HCL 4 MG/2ML IJ SOLN
4.0000 mg | Freq: Once | INTRAMUSCULAR | Status: AC
  Administered 2017-05-24: 4 mg via INTRAVENOUS

## 2017-05-24 MED ORDER — SODIUM CHLORIDE 0.9 % IJ SOLN
INTRAMUSCULAR | Status: DC | PRN
  Administered 2017-05-24: 4.5 mL

## 2017-05-24 MED ORDER — SUCRALFATE 1 GM/10ML PO SUSP
1.0000 g | Freq: Three times a day (TID) | ORAL | Status: DC
  Administered 2017-05-24 – 2017-05-26 (×8): 1 g via ORAL
  Filled 2017-05-24 (×12): qty 10

## 2017-05-24 MED ORDER — ONDANSETRON HCL 4 MG/2ML IJ SOLN
INTRAMUSCULAR | Status: AC
  Filled 2017-05-24: qty 2

## 2017-05-24 MED ORDER — OXYCODONE-ACETAMINOPHEN 5-325 MG PO TABS
1.0000 | ORAL_TABLET | Freq: Four times a day (QID) | ORAL | Status: DC | PRN
  Administered 2017-05-24 – 2017-05-26 (×4): 1 via ORAL
  Filled 2017-05-24 (×4): qty 1

## 2017-05-24 MED ORDER — FERROUS SULFATE 325 (65 FE) MG PO TABS
325.0000 mg | ORAL_TABLET | Freq: Two times a day (BID) | ORAL | Status: DC
  Administered 2017-05-24 – 2017-05-26 (×4): 325 mg via ORAL
  Filled 2017-05-24 (×5): qty 1

## 2017-05-24 MED ORDER — PROPOFOL 10 MG/ML IV BOLUS
INTRAVENOUS | Status: DC | PRN
  Administered 2017-05-24: 80 mg via INTRAVENOUS

## 2017-05-24 MED ORDER — MIDAZOLAM HCL 2 MG/2ML IJ SOLN
INTRAMUSCULAR | Status: AC
  Filled 2017-05-24: qty 2

## 2017-05-24 MED ORDER — FENTANYL CITRATE (PF) 100 MCG/2ML IJ SOLN
INTRAMUSCULAR | Status: AC
  Filled 2017-05-24: qty 2

## 2017-05-24 MED ORDER — EPINEPHRINE PF 1 MG/10ML IJ SOSY
PREFILLED_SYRINGE | INTRAMUSCULAR | Status: AC
  Filled 2017-05-24: qty 10

## 2017-05-24 MED ORDER — PROPOFOL 500 MG/50ML IV EMUL
INTRAVENOUS | Status: AC
  Filled 2017-05-24: qty 50

## 2017-05-24 MED ORDER — VITAMIN C 500 MG PO TABS
250.0000 mg | ORAL_TABLET | Freq: Two times a day (BID) | ORAL | Status: DC
  Administered 2017-05-24 – 2017-05-26 (×4): 250 mg via ORAL
  Filled 2017-05-24: qty 0.5
  Filled 2017-05-24: qty 1
  Filled 2017-05-24 (×2): qty 0.5

## 2017-05-24 MED ORDER — FENTANYL CITRATE (PF) 100 MCG/2ML IJ SOLN
25.0000 ug | INTRAMUSCULAR | Status: DC | PRN
  Administered 2017-05-24 (×3): 25 ug via INTRAVENOUS

## 2017-05-24 MED ORDER — LIDOCAINE HCL (PF) 2 % IJ SOLN
INTRAMUSCULAR | Status: AC
  Filled 2017-05-24: qty 10

## 2017-05-24 MED ORDER — PROPOFOL 500 MG/50ML IV EMUL
INTRAVENOUS | Status: DC | PRN
  Administered 2017-05-24: 150 ug/kg/min via INTRAVENOUS

## 2017-05-24 MED ORDER — LIDOCAINE HCL (CARDIAC) 20 MG/ML IV SOLN
INTRAVENOUS | Status: DC | PRN
  Administered 2017-05-24: 50 mg via INTRAVENOUS

## 2017-05-24 NOTE — Progress Notes (Signed)
Pt hemoglobin dropped from 11.2 last night after the 2 units were infused to 9.4 this am. Dr. Tobi BastosPyreddy is aware. No active bleed was observed. Will continue to monitor.

## 2017-05-24 NOTE — Anesthesia Procedure Notes (Signed)
Performed by: Burnett Lieber, CRNA Pre-anesthesia Checklist: Patient identified, Emergency Drugs available, Suction available, Patient being monitored and Timeout performed Patient Re-evaluated:Patient Re-evaluated prior to induction Oxygen Delivery Method: Nasal cannula Induction Type: IV induction       

## 2017-05-24 NOTE — Progress Notes (Signed)
[facility] - [place] at [facility]   PATIENT NAME: Patrick Galloway    MR#:  [id number]  DATE OF BIRTH:  June 29, 1960  SUBJECTIVE:  CHIEF COMPLAINT:   Chief Complaint  Patient presents with  . Rectal Bleeding    Patient stated he has had bloody stool for 2 days, HIV +  Patient still complains of lower abdominal pain which is mild, family at the bedside, status post EGD noted for gastric ulcerations with vessel-status post injection with clipping  REVIEW OF SYSTEMS:  CONSTITUTIONAL: No fever, fatigue or weakness.  EYES: No blurred or double vision.  EARS, NOSE, AND THROAT: No tinnitus or ear pain.  RESPIRATORY: No cough, shortness of breath, wheezing or hemoptysis.  CARDIOVASCULAR: No chest pain, orthopnea, edema.  GASTROINTESTINAL: No nausea, vomiting, diarrhea or abdominal pain.  GENITOURINARY: No dysuria, hematuria.  ENDOCRINE: No polyuria, nocturia,  HEMATOLOGY: No anemia, easy bruising or bleeding SKIN: No rash or lesion. MUSCULOSKELETAL: No joint pain or arthritis.   NEUROLOGIC: No tingling, numbness, weakness.  PSYCHIATRY: No anxiety or depression.   ROS  DRUG ALLERGIES:  No Known Allergies  VITALS:  Blood pressure (!) 144/75, pulse 86, temperature 98.4 F (36.9 C), resp. rate 15, height 6\' 4"  (1.93 m), weight 108.9 kg (240 lb 3 oz), SpO2 94 %.  PHYSICAL EXAMINATION:  GENERAL:  57 y.o.-year-old patient lying in the bed with no acute distress.  EYES: Pupils equal, round, reactive to light and accommodation. No scleral icterus. Extraocular muscles intact.  HEENT: Head atraumatic, normocephalic. Oropharynx and nasopharynx clear.  NECK:  Supple, no jugular venous distention. No thyroid enlargement, no tenderness.  LUNGS: Normal breath sounds bilaterally, no wheezing, rales,rhonchi or crepitation. No use of accessory muscles of respiration.  CARDIOVASCULAR: S1, S2 normal. No murmurs, rubs, or gallops.  ABDOMEN: Soft, nontender, nondistended. Bowel sounds  present. No organomegaly or mass.  EXTREMITIES: No pedal edema, cyanosis, or clubbing.  NEUROLOGIC: Cranial nerves II through XII are intact. Muscle strength 5/5 in all extremities. Sensation intact. Gait not checked.  PSYCHIATRIC: The patient is alert and oriented x 3.  SKIN: No obvious rash, lesion, or ulcer.   Physical Exam LABORATORY PANEL:   CBC Recent Labs  Lab 05/24/17 0527  WBC 8.4  HGB 9.4*  HCT 27.6*  PLT 125*   ------------------------------------------------------------------------------------------------------------------  Chemistries  Recent Labs  Lab 05/23/17 1523 05/24/17 0527  NA 140 142  K 4.2 3.7  CL 111 115*  CO2 22 21*  GLUCOSE 146* 92  BUN 67* 36*  CREATININE 0.99 0.90  CALCIUM 8.0* 7.8*  AST 19  --   ALT 13*  --   ALKPHOS 37*  --   BILITOT 1.2  --    ------------------------------------------------------------------------------------------------------------------  Cardiac Enzymes No results for input(s): TROPONINI in the last 168 hours. ------------------------------------------------------------------------------------------------------------------  RADIOLOGY:  No results found.  ASSESSMENT AND PLAN:  Patient is 57 year old with history of HIV presenting with dark tarry stools  1 acute melena/upper GI bleeding Stable Status post EGD May 24, 2017 which was noted for a few gastric ulcerations/vessel noted-status post injection/clip placements Check H&H every 8 hours, CBC daily, transfuse as needed, Protonix drip, Carafate with meals, for clear liquid diet, close medical monitoring  2 acute hypotension Secondary to above Resolved with IV fluids for rehydration  3 chronic HIV infection Stable Restart antiretroviral agents  4 chronic diabetes mellitus type 2 Stable Continue sliding scale insulin with Accu-Cheks per routine Continue to hold metformin   5 chronic benign essential hypertension Continue  to hold  antihypertensives given relative hypotension Hydralazine as needed systolic blood pressure greater than 160  6 chronic hyperlipidemia, unspecified Stable on current regiment  All the records are reviewed and case discussed with Care Management/Social Workerr. Management plans discussed with the patient, family and they are in agreement.  CODE STATUS: full  TOTAL TIME TAKING CARE OF THIS PATIENT: 35 minutes.     POSSIBLE D/C IN 1-2 DAYS, DEPENDING ON CLINICAL CONDITION.   [name] M.D on 05/24/2017   Between 7am to 6pm - Pager - [phone]  After 6pm go to [email or web address] - password [email or web address]  [facility] [place] [facility]  Office  [phone]  CC: Primary care physician; [place]  Note: This dictation was prepared with Dragon dictation along with smaller phrase technology. Any transcriptional errors that result from this process are unintentional.

## 2017-05-24 NOTE — Op Note (Addendum)
[facility] Gastroenterology Patient Name: Patrick ChromanCalvert Befort Procedure Date: 05/24/2017 10:21 AM MRN: [id number] Account #: [id number] Date of Birth: 06-06-1960 Admit Type: Inpatient Age: 57 [facility] 4 Gender: Male Note Status: Supervisor Override Procedure:            Upper GI endoscopy Indications:          Melena Providers:            Midge Miniumarren Calahan Pak MD, MD Referring MD:         No Local Md, MD (Referring MD) Medicines:            Propofol per Anesthesia Complications:        No immediate complications. Procedure:            Pre-Anesthesia Assessment:                       - Prior to the procedure, a History and Physical was                        performed, and patient medications and allergies were                        reviewed. The patient's tolerance of previous                        anesthesia was also reviewed. The risks and benefits of                        the procedure and the sedation options and risks were                        discussed with the patient. All questions were                        answered, and informed consent was obtained. Prior                        Anticoagulants: The patient has taken no previous                        anticoagulant or antiplatelet agents. ASA Grade                        Assessment: II - A patient with mild systemic disease.                        After reviewing the risks and benefits, the patient was                        deemed in satisfactory condition to undergo the                        procedure.                       After obtaining informed consent, the endoscope was                        passed under direct vision. Throughout the procedure,  the patient's blood pressure, pulse, and oxygen                        saturations were monitored continuously. The                        Colonoscope was introduced through the mouth, and                        advanced to the  second part of duodenum. The upper GI                        endoscopy was accomplished without difficulty. The                        patient tolerated the procedure well. Findings:      The examined esophagus was normal.      Three non-bleeding cratered gastric ulcers with a visible vessel were       found in the gastric antrum. Area was successfully injected with 4 mL of       a 1:10,000 solution of epinephrine for hemostasis. For hemostasis, two       hemostatic clips were successfully placed (MR conditional). There was no       bleeding at the end of the procedure.      The examined duodenum was normal. Impression:           - Normal esophagus.                       - Non-bleeding gastric ulcers with a visible vessel.                        Injected. Clips (MR conditional) were placed.                       - Normal examined duodenum.                       - No specimens collected. Recommendation:       - Return patient to hospital ward for ongoing care.                       - Clear liquid diet.                       - Continue present medications.                       - Avoid NSAIDS. Procedure Code(s):    --- Professional ---                       [phone], Esophagogastroduodenoscopy, flexible, transoral;                        with control of bleeding, any method Diagnosis Code(s):    --- Professional ---                       K92.1, Melena (includes Hematochezia)                       K25.4, Chronic or unspecified gastric ulcer with  hemorrhage CPT copyright 2018 American Medical Association. All rights reserved. The codes documented in this report are preliminary and upon coder review may  be revised to meet current compliance requirements. [name] MD, MD 05/24/2017 10:48:39 AM This report has been signed electronically. Number of Addenda: 0 Note Initiated On: 05/24/2017 10:21 AM      [facility]

## 2017-05-24 NOTE — Progress Notes (Signed)
Notified Dr. Katheren ShamsSalary of hemoglobin 8.6, received verbal order for ferrous sulfate 325mg  BID and vitamin C 250mg  BID. Received verbal order from Dr. Servando SnareWohl for a clear liquid diet now.

## 2017-05-24 NOTE — Consult Note (Signed)
[name], MD [facility]  [address] [place], [place] [address] Phone: [phone] Fax : [phone]  Consultation  Referring Provider:     No ref. provider found Primary Care Physician:  [place], [place] [facility] Primary Gastroenterologist:  Gentry Fitz         Reason for Consultation:     Melena  Date of Admission:  05/23/2017 Date of Consultation:  05/24/2017         HPI:   Patrick Galloway is a 57 y.o. male who has a history of having a colonoscopy back in 2016. The patient was admitted with 2 days of black starry stools. The patient denies taking NSAIDs but it appears that there is report that he takes naproxen 2 times a day. The patient has not had any GI bleeding in the past but states that he started to have GI bleeding the day before he started on a new HIV medication. There is no report of any weight loss until he was admitted to the hospital whereupon he states he is now losing weight. There is no report of any hematemesis. The patient was found to have a hemoglobin of 10.1 on admission which has gone down to 9.4. It was reported that the patient had 2 units of blood on admission. The patient's abdominal pain is crampy abdominal pain in the lower abdomen. He denies taking any blood thinners.   Past Medical History:  Diagnosis Date  . DM2 (diabetes mellitus, type 2) (HCC)   . HIV infection (HCC)   . Hyperlipemia   . Hypertension   . Immune deficiency disorder (HCC)    PT reports HIV    Past Surgical History:  Procedure Laterality Date  . none      Prior to Admission medications   Medication Sig Start Date End Date Taking? Authorizing Provider  Aspirin (ASPIR-81 PO) Take 81 mg by mouth daily. 11/03/12  Yes [provider]  atorvastatin (LIPITOR) 40 MG tablet Take 1 tablet by mouth daily. 05/08/17  Yes [provider]  BIKTARVY 50-200-25 MG TABS tablet Take 1 tablet by mouth daily. 05/20/17  Yes [provider]  lisinopril  (PRINIVIL,ZESTRIL) 20 MG tablet Take 1 tablet by mouth daily. 05/08/17  Yes [provider]  metFORMIN (GLUCOPHAGE) 500 MG tablet Take 1 tablet by mouth daily. 05/08/17  Yes [provider]  NAPROXEN DR 500 MG EC tablet Take 1 tablet by mouth 2 (two) times daily. 05/08/17  Yes [provider]  ranitidine (ZANTAC) 150 MG tablet Take 1 tablet by mouth daily. 05/19/17  Yes [provider]    Family History  Problem Relation Age of Onset  . Heart failure Mother   . Heart failure Father      Social History   Tobacco Use  . Smoking status: Current Every Day Smoker    Packs/day: 0.50    Years: 25.00    Pack years: 12.50  . Smokeless tobacco: Never Used  Substance Use Topics  . Alcohol use: No    Frequency: Never  . Drug use: No    Allergies as of 05/23/2017  . (Not on File)    Review of Systems:    All systems reviewed and negative except where noted in HPI.   Physical Exam:  Vital signs in last 24 hours: Temp:  [97.7 F (36.5 C)-99 F (37.2 C)] 98.7 F (37.1 C) (01/19 0816) Pulse Rate:  [72-96] 77 (01/19 0816) Resp:  [13-29] 16 (01/19 0816) BP: (  68-117)/(49-76) 112/65 (01/19 0816) SpO2:  [98 %-100 %] 100 % (01/19 0816) Weight:  [240 lb 3 oz (108.9 kg)-250 lb (113.4 kg)] 240 lb 3 oz (108.9 kg) (01/18 2016) Last BM Date: 05/23/17 General:   Pleasant, cooperative in NAD Head:  Normocephalic and atraumatic. Eyes:   No icterus.   Conjunctiva pink. PERRLA. Ears:  Normal auditory acuity. Neck:  Supple; no masses or thyroidomegaly Lungs: Respirations even and unlabored. Lungs clear to auscultation bilaterally.   No wheezes, crackles, or rhonchi.  Heart:  Regular rate and rhythm;  Without murmur, clicks, rubs or gallops Abdomen:  Soft, nondistended,lower abdominal tenderness. Normal bowel sounds. No appreciable masses or hepatomegaly.  No rebound or guarding.  Rectal:  Not performed. Msk:  Symmetrical without gross deformities.    Extremities:   Without edema, cyanosis or clubbing. Neurologic:  Alert and oriented x3;  grossly normal neurologically. Skin:  Intact without significant lesions or rashes. Cervical Nodes:  No significant cervical adenopathy. Psych:  Alert and cooperative. Normal affect.  LAB RESULTS: Recent Labs    05/23/17 1523 05/23/17 2019 05/24/17 0527  WBC 7.0  --  8.4  HGB 10.1* 11.2* 9.4*  HCT 30.2*  --  27.6*  PLT 165  --  125*   BMET Recent Labs    05/23/17 1523 05/24/17 0527  NA 140 142  K 4.2 3.7  CL 111 115*  CO2 22 21*  GLUCOSE 146* 92  BUN 67* 36*  CREATININE 0.99 0.90  CALCIUM 8.0* 7.8*   LFT Recent Labs    05/23/17 1523  PROT 6.4*  ALBUMIN 3.3*  AST 19  ALT 13*  ALKPHOS 37*  BILITOT 1.2   PT/INR Recent Labs    05/23/17 1523  LABPROT 15.1  INR 1.20    STUDIES: No results found.    Impression / Plan:   Patrick Galloway is a 57 y.o. y/o male with melena and a drop in his hemoglobin despite a transfusion of 2 units of packed red blood cells. The patient was taking naproxen twice a day. The patient has been kept nothing by mouth and reports that he has had no further black stools today. The patient will be set up for an EGD for today.I have discussed risks & benefits which include, but are not limited to, bleeding, infection, perforation & drug reaction.  The patient agrees with this plan & written consent will be obtained.      Thank you for involving me in the care of this patient.      LOS: 1 day   Midge Miniumarren Johonna Binette, MD  05/24/2017, 10:20 AM   Note: This dictation was prepared with Dragon dictation along with smaller phrase technology. Any transcriptional errors that result from this process are unintentional.

## 2017-05-24 NOTE — Transfer of Care (Signed)
Immediate Anesthesia Transfer of Care Note  Patient: Patrick RobinsonCalvert W Baranek  Procedure(s) Performed: ESOPHAGOGASTRODUODENOSCOPY (EGD) WITH PROPOFOL (N/A )  Patient Location: PACU  Anesthesia Type:General  Level of Consciousness: awake and responds to stimulation  Airway & Oxygen Therapy: Patient Spontanous Breathing and Patient connected to nasal cannula oxygen  Post-op Assessment: Report given to RN and Post -op Vital signs reviewed and stable  Post vital signs: Reviewed and stable  Last Vitals:  Vitals:   05/24/17 1056 05/24/17 1058  BP: 118/68 118/68  Pulse: 87 84  Resp: 16 20  Temp: 36.9 C   SpO2: 100% 100%    Last Pain:  Vitals:   05/24/17 1056  TempSrc:   PainSc: Asleep         Complications: No apparent anesthesia complications

## 2017-05-24 NOTE — Anesthesia Preprocedure Evaluation (Signed)
Anesthesia Evaluation  Patient identified by MRN, date of birth, ID band Patient awake    Reviewed: Allergy & Precautions, H&P , NPO status , Patient's Chart, lab work & pertinent test results  History of Anesthesia Complications Negative for: history of anesthetic complications  Airway Mallampati: III  TM Distance: >3 FB Neck ROM: full    Dental  (+) Chipped, Poor Dentition, Missing   Pulmonary neg shortness of breath, COPD, Current Smoker,           Cardiovascular Exercise Tolerance: Good hypertension, (-) angina(-) Past MI and (-) PND      Neuro/Psych negative neurological ROS  negative psych ROS   GI/Hepatic negative GI ROS, Neg liver ROS, neg GERD  ,  Endo/Other  diabetes, Type 2  Renal/GU negative Renal ROS  negative genitourinary   Musculoskeletal   Abdominal   Peds  Hematology negative hematology ROS (+)   Anesthesia Other Findings Past Medical History: No date: DM2 (diabetes mellitus, type 2) (HCC) No date: HIV infection (HCC) No date: Hyperlipemia No date: Hypertension No date: Immune deficiency disorder (HCC)     Comment:  PT reports HIV  Past Surgical History: No date: none  BMI    Body Mass Index:  29.24 kg/m      Reproductive/Obstetrics negative OB ROS                             Anesthesia Physical Anesthesia Plan  ASA: III  Anesthesia Plan: General   Post-op Pain Management:    Induction: Intravenous  PONV Risk Score and Plan: Propofol infusion  Airway Management Planned: Natural Airway and Nasal Cannula  Additional Equipment:   Intra-op Plan:   Post-operative Plan:   Informed Consent: I have reviewed the patients History and Physical, chart, labs and discussed the procedure including the risks, benefits and alternatives for the proposed anesthesia with the patient or authorized representative who has indicated his/her understanding and  acceptance.   Dental Advisory Given  Plan Discussed with: Anesthesiologist, CRNA and Surgeon  Anesthesia Plan Comments: (Patient consented for risks of anesthesia including but not limited to:  - adverse reactions to medications - risk of intubation if required - damage to teeth, lips or other oral mucosa - sore throat or hoarseness - Damage to heart, brain, lungs or loss of life  Patient voiced understanding.)        Anesthesia Quick Evaluation

## 2017-05-24 NOTE — Plan of Care (Signed)
  Education: Ability to identify signs and symptoms of gastrointestinal bleeding will improve 05/24/2017 1926 - Progressing by Donnel SaxonKennedy, Danalee Flath L, RN   Bowel/Gastric: Will show no signs and symptoms of gastrointestinal bleeding 05/24/2017 1926 - Progressing by Donnel SaxonKennedy, Kallin Henk L, RN   Fluid Volume: Will show no signs and symptoms of excessive bleeding 05/24/2017 1926 - Progressing by Donnel SaxonKennedy, Davona Kinoshita L, RN   Clinical Measurements: Complications related to the disease process, condition or treatment will be avoided or minimized 05/24/2017 1926 - Progressing by Donnel SaxonKennedy, Klea Nall L, RN   Education: Knowledge of General Education information will improve 05/24/2017 1926 - Progressing by Donnel SaxonKennedy, Harlee Eckroth L, RN   Clinical Measurements: Ability to maintain clinical measurements within normal limits will improve 05/24/2017 1926 - Progressing by Donnel SaxonKennedy, Maryl Blalock L, RN   Activity: Risk for activity intolerance will decrease 05/24/2017 1926 - Progressing by Donnel SaxonKennedy, Aaronjames Kelsay L, RN   Pain Managment: General experience of comfort will improve 05/24/2017 1926 - Progressing by Donnel SaxonKennedy, Matina Rodier L, RN   Safety: Ability to remain free from injury will improve 05/24/2017 1926 - Progressing by Donnel SaxonKennedy, Dyan Labarbera L, RN   Skin Integrity: Risk for impaired skin integrity will decrease 05/24/2017 1926 - Progressing by Donnel SaxonKennedy, Montserrat Shek L, RN

## 2017-05-24 NOTE — Anesthesia Postprocedure Evaluation (Signed)
Anesthesia Post Note  Patient: Patrick Galloway  Procedure(s) Performed: ESOPHAGOGASTRODUODENOSCOPY (EGD) WITH PROPOFOL (N/A )  Patient location during evaluation: PACU Anesthesia Type: General Level of consciousness: awake and alert Pain management: pain level controlled Vital Signs Assessment: post-procedure vital signs reviewed and stable Respiratory status: spontaneous breathing, nonlabored ventilation, respiratory function stable and patient connected to nasal cannula oxygen Cardiovascular status: blood pressure returned to baseline and stable Postop Assessment: no apparent nausea or vomiting Anesthetic complications: no     Last Vitals:  Vitals:   05/24/17 1146 05/24/17 1219  BP: 126/80 (!) 101/56  Pulse: 82 81  Resp: 19 16  Temp: 36.7 C 37.1 C  SpO2: 95% 98%    Last Pain:  Vitals:   05/24/17 1219  TempSrc: Oral  PainSc:                  Patrick Galloway

## 2017-05-24 NOTE — Anesthesia Post-op Follow-up Note (Signed)
Anesthesia QCDR form completed.        

## 2017-05-25 ENCOUNTER — Encounter: Payer: Self-pay | Admitting: Gastroenterology

## 2017-05-25 LAB — CBC
HCT: 24.2 % — ABNORMAL LOW (ref 40.0–52.0)
Hemoglobin: 8.2 g/dL — ABNORMAL LOW (ref 13.0–18.0)
MCH: 30.4 pg (ref 26.0–34.0)
MCHC: 33.9 g/dL (ref 32.0–36.0)
MCV: 89.7 fL (ref 80.0–100.0)
PLATELETS: 124 10*3/uL — AB (ref 150–440)
RBC: 2.69 MIL/uL — AB (ref 4.40–5.90)
RDW: 14.7 % — ABNORMAL HIGH (ref 11.5–14.5)
WBC: 6.5 10*3/uL (ref 3.8–10.6)

## 2017-05-25 LAB — HEMOGLOBIN AND HEMATOCRIT, BLOOD
HCT: 24.6 % — ABNORMAL LOW (ref 40.0–52.0)
HCT: 27 % — ABNORMAL LOW (ref 40.0–52.0)
HEMATOCRIT: 26.1 % — AB (ref 40.0–52.0)
Hemoglobin: 8.3 g/dL — ABNORMAL LOW (ref 13.0–18.0)
Hemoglobin: 9 g/dL — ABNORMAL LOW (ref 13.0–18.0)
Hemoglobin: 9 g/dL — ABNORMAL LOW (ref 13.0–18.0)

## 2017-05-25 LAB — GLUCOSE, CAPILLARY
GLUCOSE-CAPILLARY: 84 mg/dL (ref 65–99)
GLUCOSE-CAPILLARY: 87 mg/dL (ref 65–99)
Glucose-Capillary: 98 mg/dL (ref 65–99)
Glucose-Capillary: 113 mg/dL — ABNORMAL HIGH (ref 65–99)

## 2017-05-25 LAB — PREPARE RBC (CROSSMATCH)

## 2017-05-25 MED ORDER — ATORVASTATIN CALCIUM 20 MG PO TABS
40.0000 mg | ORAL_TABLET | Freq: Every day | ORAL | Status: DC
  Administered 2017-05-25: 40 mg via ORAL
  Filled 2017-05-25: qty 2

## 2017-05-25 MED ORDER — DIPHENHYDRAMINE HCL 25 MG PO CAPS
25.0000 mg | ORAL_CAPSULE | Freq: Once | ORAL | Status: AC
  Administered 2017-05-25: 25 mg via ORAL
  Filled 2017-05-25: qty 1

## 2017-05-25 MED ORDER — BICTEGRAVIR-EMTRICITAB-TENOFOV 50-200-25 MG PO TABS
1.0000 | ORAL_TABLET | Freq: Every day | ORAL | Status: DC
  Administered 2017-05-25 – 2017-05-26 (×2): 1 via ORAL
  Filled 2017-05-25 (×2): qty 1

## 2017-05-25 MED ORDER — SODIUM CHLORIDE 0.9 % IV SOLN
Freq: Once | INTRAVENOUS | Status: AC
  Administered 2017-05-25: 13:00:00 via INTRAVENOUS

## 2017-05-25 MED ORDER — ACETAMINOPHEN 325 MG PO TABS
650.0000 mg | ORAL_TABLET | Freq: Once | ORAL | Status: AC
  Administered 2017-05-25: 650 mg via ORAL
  Filled 2017-05-25: qty 2

## 2017-05-25 MED ORDER — PANTOPRAZOLE SODIUM 40 MG PO TBEC
40.0000 mg | DELAYED_RELEASE_TABLET | Freq: Two times a day (BID) | ORAL | Status: DC
  Administered 2017-05-25 – 2017-05-26 (×3): 40 mg via ORAL
  Filled 2017-05-25 (×3): qty 1

## 2017-05-25 NOTE — Progress Notes (Signed)
[facility] [place] [facility]   PATIENT NAME: Patrick Galloway    MR#:  [id number]  DATE OF BIRTH:  10/25/1960  SUBJECTIVE:  CHIEF COMPLAINT:   Chief Complaint  Patient presents with  . Rectal Bleeding    Patient stated he has had bloody stool for 2 days, HIV +  Patient remains lightheaded/dizzy, near syncopal with ambulation/standing, no further lower GI bleeding noted  REVIEW OF SYSTEMS:  CONSTITUTIONAL: No fever, fatigue or weakness.  EYES: No blurred or double vision.  EARS, NOSE, AND THROAT: No tinnitus or ear pain.  RESPIRATORY: No cough, shortness of breath, wheezing or hemoptysis.  CARDIOVASCULAR: No chest pain, orthopnea, edema.  GASTROINTESTINAL: No nausea, vomiting, diarrhea or abdominal pain.  GENITOURINARY: No dysuria, hematuria.  ENDOCRINE: No polyuria, nocturia,  HEMATOLOGY: No anemia, easy bruising or bleeding SKIN: No rash or lesion. MUSCULOSKELETAL: No joint pain or arthritis.   NEUROLOGIC: No tingling, numbness, weakness.  PSYCHIATRY: No anxiety or depression.   ROS  DRUG ALLERGIES:  No Known Allergies  VITALS:  Blood pressure 112/76, pulse 64, temperature 98.6 F (37 C), temperature source Oral, resp. rate 16, height 6\' 4"  (1.93 m), weight 108.9 kg (240 lb 3 oz), SpO2 96 %.  PHYSICAL EXAMINATION:  GENERAL:  57 y.o.-year-old patient lying in the bed with no acute distress.  EYES: Pupils equal, round, reactive to light and accommodation. No scleral icterus. Extraocular muscles intact.  HEENT: Head atraumatic, normocephalic. Oropharynx and nasopharynx clear.  NECK:  Supple, no jugular venous distention. No thyroid enlargement, no tenderness.  LUNGS: Normal breath sounds bilaterally, no wheezing, rales,rhonchi or crepitation. No use of accessory muscles of respiration.  CARDIOVASCULAR: S1, S2 normal. No murmurs, rubs, or gallops.  ABDOMEN: Soft, nontender, nondistended. Bowel sounds present. No organomegaly or mass.  EXTREMITIES: No  pedal edema, cyanosis, or clubbing.  NEUROLOGIC: Cranial nerves II through XII are intact. Muscle strength 5/5 in all extremities. Sensation intact. Gait not checked.  PSYCHIATRIC: The patient is alert and oriented x 3.  SKIN: No obvious rash, lesion, or ulcer.   Physical Exam LABORATORY PANEL:   CBC Recent Labs  Lab 05/25/17 0523 05/25/17 0723  WBC 6.5  --   HGB 8.2* 8.3*  HCT 24.2* 24.6*  PLT 124*  --    ------------------------------------------------------------------------------------------------------------------  Chemistries  Recent Labs  Lab 05/23/17 1523 05/24/17 0527  NA 140 142  K 4.2 3.7  CL 111 115*  CO2 22 21*  GLUCOSE 146* 92  BUN 67* 36*  CREATININE 0.99 0.90  CALCIUM 8.0* 7.8*  AST 19  --   ALT 13*  --   ALKPHOS 37*  --   BILITOT 1.2  --    ------------------------------------------------------------------------------------------------------------------  Cardiac Enzymes No results for input(s): TROPONINI in the last 168 hours. ------------------------------------------------------------------------------------------------------------------  RADIOLOGY:  No results found.  ASSESSMENT AND PLAN:  Patient is 57 year old with history of HIV presenting with acute melena  1 acute melena/upper GI bleeding Stable Status post EGD May 24, 2017 which was noted for a few gastric ulcerations/vessel noted-status post injection/clip placements Transfuse another unit packed red blood cells given near syncope/lightheadedness/dizziness with ambulation, continue to check H&H every 8 hours, CBC daily, transfuse as needed, change to Protonix p.o. twice daily, Carafate with meals, on clear liquid diet-changes per gastroenterology, and continue close medical monitoring   2 acute hypotension Improved Most likely secondary to above Stable, though has low normal blood pressure recordings, for 1 more unit packed red blood cells, continue to hold  antihypertensives/lisinopril  3 chronic HIV infection Stable Continue antiretroviral agents  4 chronic diabetes mellitus type 2 Stable Continue SSI Accu-Cheks per routine Continue to hold metformin  while in house  5 chronic benign essential hypertension Continue to hold antihypertensives given relative hypotension Hydralazine as needed systolic blood pressure greater than 160  6 chronic hyperlipidemia, unspecified Stable on current regiment  All the records are reviewed and case discussed with Care Management/Social Workerr. Management plans discussed with the patient, family and they are in agreement.  CODE STATUS: full  TOTAL TIME TAKING CARE OF THIS PATIENT: 35 minutes.     POSSIBLE D/C IN 1DAYS, DEPENDING ON CLINICAL CONDITION.   Evelena Asa [place] Throckmorton M.D on 05/25/2017   Between 7am to 6pm - Pager - [phone]  After 6pm go to [email or web address] - password [email or web address]  [facility]  Office  [phone]  CC: Primary care physician; [place] [place] [facility]  Note: This dictation was prepared with Dragon dictation along with smaller phrase technology. Any transcriptional errors that result from this process are unintentional.

## 2017-05-25 NOTE — Progress Notes (Signed)
Dr. Katheren ShamsSalary requested to ambulate patient, patient became dizzy upon standing. Stating dizziness did not subside. Sat patient back into bed. Spoke with Dr. Katheren ShamsSalary of symptoms, verbal order received to give 1 unit of blood.

## 2017-05-25 NOTE — Progress Notes (Signed)
  Patrick Miniumarren Shayanne Gomm, MD [facility]   [address] [place], [place] [address] Phone: [phone] Fax : [phone]   Subjective: The patient states that he has been doing well overnight. The patient had an EGD yesterday with a visible vessel and an ulcer seen. The patient has had no further sign of bleeding and his hemoglobin has been stable. The patient had 2 clips placed on the visible vessel and his gastric ulcer. There is no report of any vomiting blood or black stools.   Objective: Vital signs in last 24 hours: Vitals:   05/24/17 1219 05/24/17 2009 05/25/17 0444 05/25/17 0806  BP: (!) 101/56 115/74 112/71 112/76  Pulse: 81 84 69 64  Resp: 16 20 17 16   Temp: 98.7 F (37.1 C) 98.4 F (36.9 C) 98.4 F (36.9 C) 98.6 F (37 C)  TempSrc: Oral Oral Oral Oral  SpO2: 98% 100% 99% 96%  Weight:      Height:       Weight change:   Intake/Output Summary (Last 24 hours) at 05/25/2017 1150 Last data filed at 05/25/2017 0900 Gross per 24 hour  Intake 1920 ml  Output 200 ml  Net 1720 ml     Exam: Heart:: Regular rate and rhythm, S1S2 present or without murmur or extra heart sounds Lungs: normal and clear to auscultation and percussion Abdomen: soft, nontender, normal bowel sounds   Lab Results: @LABTEST2 @ Micro Results: No results found for this or any previous visit (from the past 240 hour(s)). Studies/Results: No results found. Medications: I have reviewed the patient's current medications. Scheduled Meds: . acetaminophen  650 mg Oral Once  . diphenhydrAMINE  25 mg Oral Once  . ferrous sulfate  325 mg Oral BID WC  . insulin aspart  0-9 Units Subcutaneous TID WC  . pantoprazole  40 mg Oral BID  . sucralfate  1 g Oral TID WC & HS  . vitamin C  250 mg Oral BID   Continuous Infusions: . sodium chloride     PRN Meds:.acetaminophen **OR** acetaminophen, ondansetron **OR** ondansetron (ZOFRAN) IV, oxyCODONE-acetaminophen   Assessment: Active Problems:   Upper GI  bleed   Acute gastric ulcer with hemorrhage   Melena    Plan: This patient had an upper GI bleed that was found to be caused by a gastric ulcer with visible vessel. The vessel and surrounding area was injected with epinephrine and 2 clips were placed across the vessel. The patient has had no further sign of GI bleeding. The patient will have his diet advanced.  I will sign off.  Please call if any further GI concerns or questions.  We would like to thank you for the opportunity to participate in the care of Patrick Galloway.     LOS: 2 days   Patrick MiniumDarren Patrick Galloway 05/25/2017, 11:50 AM

## 2017-05-26 LAB — CBC
HCT: 27.1 % — ABNORMAL LOW (ref 40.0–52.0)
HEMOGLOBIN: 9.2 g/dL — AB (ref 13.0–18.0)
MCH: 30 pg (ref 26.0–34.0)
MCHC: 34 g/dL (ref 32.0–36.0)
MCV: 88.3 fL (ref 80.0–100.0)
PLATELETS: 150 10*3/uL (ref 150–440)
RBC: 3.07 MIL/uL — AB (ref 4.40–5.90)
RDW: 15.1 % — ABNORMAL HIGH (ref 11.5–14.5)
WBC: 7.1 10*3/uL (ref 3.8–10.6)

## 2017-05-26 LAB — BPAM RBC
BLOOD PRODUCT EXPIRATION DATE: 201902022359
Blood Product Expiration Date: 201902022359
Blood Product Expiration Date: 201902082359
ISSUE DATE / TIME: 201901181534
ISSUE DATE / TIME: 201901181534
ISSUE DATE / TIME: 201901201326
UNIT TYPE AND RH: 5100
UNIT TYPE AND RH: 5100
Unit Type and Rh: 5100

## 2017-05-26 LAB — TYPE AND SCREEN
ABO/RH(D): O POS
ANTIBODY SCREEN: NEGATIVE
UNIT DIVISION: 0
Unit division: 0
Unit division: 0

## 2017-05-26 LAB — GLUCOSE, CAPILLARY
GLUCOSE-CAPILLARY: 92 mg/dL (ref 65–99)
Glucose-Capillary: 102 mg/dL — ABNORMAL HIGH (ref 65–99)
Glucose-Capillary: 93 mg/dL (ref 65–99)

## 2017-05-26 MED ORDER — OXYCODONE-ACETAMINOPHEN 5-325 MG PO TABS: 1.0000 | ORAL_TABLET | Freq: Four times a day (QID) | ORAL | 0 refills | Status: DC | PRN

## 2017-05-26 MED ORDER — AMOXICILLIN-POT CLAVULANATE 875-125 MG PO TABS: 1.0000 | ORAL_TABLET | Freq: Two times a day (BID) | ORAL | 0 refills | Status: AC

## 2017-05-26 MED ORDER — SUCRALFATE 1 GM/10ML PO SUSP: 1.0000 g | Freq: Three times a day (TID) | ORAL | 0 refills | Status: AC

## 2017-05-26 MED ORDER — FERROUS SULFATE 325 (65 FE) MG PO TABS: 325.0000 mg | ORAL_TABLET | Freq: Two times a day (BID) | ORAL | 0 refills | Status: DC

## 2017-05-26 MED ORDER — PANTOPRAZOLE SODIUM 40 MG PO TBEC: 40.0000 mg | DELAYED_RELEASE_TABLET | Freq: Two times a day (BID) | ORAL | 0 refills | Status: AC

## 2017-05-26 NOTE — Progress Notes (Addendum)
[facility] [facility] [place] [facility]   PATIENT NAME: Patrick Galloway    MR#:  [id number]  DATE OF BIRTH:  Aug 18, 1960  SUBJECTIVE: Stable for discharge, complaints of swelling in  upper gums.  Patient has diabetes and he lost all his upper front teeth and patient has no evidence of infection of the gums and swelling because of his edentulous status and also need appointment with dentist.  Told the patient that I can give him Augmentin for 7 days and see the dentist ,he is advised to see dentist as soon as possible.  CHIEF COMPLAINT:   Chief Complaint  Patient presents with  . Rectal Bleeding    Patient stated he has had bloody stool for 2 days, HIV +  Patient remains lightheaded/dizzy, near syncopal with ambulation/standing, no further lower GI bleeding noted  REVIEW OF SYSTEMS:  CONSTITUTIONAL: No fever, fatigue or weakness.  EYES: No blurred or double vision.  EARS, NOSE, AND THROAT: No tinnitus or ear pain.  RESPIRATORY: No cough, shortness of breath, wheezing or hemoptysis.  CARDIOVASCULAR: No chest pain, orthopnea, edema.  GASTROINTESTINAL: No nausea, vomiting, diarrhea or abdominal pain.  GENITOURINARY: No dysuria, hematuria.  ENDOCRINE: No polyuria, nocturia,  HEMATOLOGY: No anemia, easy bruising or bleeding SKIN: No rash or lesion. MUSCULOSKELETAL: No joint pain or arthritis.   NEUROLOGIC: No tingling, numbness, weakness.  PSYCHIATRY: No anxiety or depression.   ROS  DRUG ALLERGIES:  No Known Allergies  VITALS:  Blood pressure 135/69, pulse 66, temperature 98.9 F (37.2 C), temperature source Oral, resp. rate 18, height 6\' 4"  (1.93 m), weight 108.9 kg (240 lb 3 oz), SpO2 100 %.  PHYSICAL EXAMINATION:  GENERAL:  57 y.o.-year-old patient lying in the bed with no acute distress.  EYES: Pupils equal, round, reactive to light and accommodation. No scleral icterus. Extraocular muscles intact.  HEENT: Head atraumatic, normocephalic. Oropharynx and nasopharynx  clear.  No erythema.  Noticed swelling of the gums but no evidence of infection he told me that he lost all his upper teeth, they fell off. NECK:  Supple, no jugular venous distention. No thyroid enlargement, no tenderness.  LUNGS: Normal breath sounds bilaterally, no wheezing, rales,rhonchi or crepitation. No use of accessory muscles of respiration.  CARDIOVASCULAR: S1, S2 normal. No murmurs, rubs, or gallops.  ABDOMEN: Soft, nontender, nondistended. Bowel sounds present. No organomegaly or mass.  EXTREMITIES: No pedal edema, cyanosis, or clubbing.  NEUROLOGIC: Cranial nerves II through XII are intact. Muscle strength 5/5 in all extremities. Sensation intact. Gait not checked.  PSYCHIATRIC: The patient is alert and oriented x 3.  SKIN: No obvious rash, lesion, or ulcer.   Physical Exam LABORATORY PANEL:   CBC Recent Labs  Lab 05/26/17 0443  WBC 7.1  HGB 9.2*  HCT 27.1*  PLT 150   ------------------------------------------------------------------------------------------------------------------  Chemistries  Recent Labs  Lab 05/23/17 1523 05/24/17 0527  NA 140 142  K 4.2 3.7  CL 111 115*  CO2 22 21*  GLUCOSE 146* 92  BUN 67* 36*  CREATININE 0.99 0.90  CALCIUM 8.0* 7.8*  AST 19  --   ALT 13*  --   ALKPHOS 37*  --   BILITOT 1.2  --    ------------------------------------------------------------------------------------------------------------------  Cardiac Enzymes No results for input(s): TROPONINI in the last 168 hours. ------------------------------------------------------------------------------------------------------------------  RADIOLOGY:  No results found.  ASSESSMENT AND PLAN:  Patient is 57 year old with history of HIV presenting with acute melena  1 acute melena/upper GI bleeding Stable Status post EGD May 24, 2017 which was noted for a few gastric ulcerations/vessel noted-status post injection/clip placements Hemoglobin stable at 9, received 1  unit of packed RBC yesterday.  Continue PPI, Carafate discharge, advised the patient not to take Advil, naproxen, other NSAID products patient can continue 81 mg of aspirin daily per GI recommendation.  But advised the patient not to take any naproxen or Advil.  For arthritis pain.  Did give a limited supply of Percocet.  And follow-up with PCP for continued need for Percocet prescription if needed.  Work note is written as per patient request he wanted to go back to work next Monday.  3 chronic HIV infection Stable Continue antiretroviral agents  4 chronic diabetes mellitus type 2 Stable restarted metformin.  5 chronic benign essential hypertension: Start BP medicines at discharge   6 chronic hyperlipidemia, unspecified Stable on current regiment #7, swelling in upper gums secondary to recent loss of, no evidence of infection.  given history of diabetes I gave prescription for prophylactic antibiotic.    All the records are reviewed and case discussed with Care Management/Social Workerr. Management plans discussed with the patient, family and they are in agreement.  CODE STATUS: full  TOTAL TIME TAKING CARE OF THIS PATIENT: 35 minutes.     DC home today   Katha Hamming M.D on 05/26/2017   Between 7am to 6pm - Pager - [phone]  After 6pm go to [email or web address] - password [email or web address]  [facility]  Office  [phone]  CC: Primary care physician; [place]  Note: This dictation was prepared with Dragon dictation along with smaller phrase technology. Any transcriptional errors that result from this process are unintentional.

## 2017-05-26 NOTE — Progress Notes (Signed)
Discharge instructions along with home medications and follow up gone over with patient and daughter. Both verbalize that they understood instructions. 1 prescriptions given to patient. IV's and tele removed. Pt being discharged home on room air, no distress noted. Otilio JeffersonMadelyn S Fenton, RN

## 2017-05-27 LAB — PREPARE RBC (CROSSMATCH)

## 2017-05-29 ENCOUNTER — Telehealth: Payer: Self-pay | Admitting: Gastroenterology

## 2017-05-29 NOTE — Telephone Encounter (Signed)
Let the patient know that he had an ulcer and it was treated. From my point of view he does not need to be out of work for the ulcer.

## 2017-05-29 NOTE — Telephone Encounter (Signed)
Pt is requesting you to write him out of work until his office appt with you on 06/17/17. I advised him I would need to speak with you to see if you would even consider this since you only done his EGD at the hospital due to an ER visit. Please review report and let me know.

## 2017-05-29 NOTE — Discharge Summary (Signed)
Patrick Galloway, is a 57 y.o. male  DOB Jun 19, 1960  MRN [id number].  Admission date:  05/23/2017  Admitting Physician  Auburn Bilberry, MD  Discharge Date:  05/26/2017   Primary MD  [facility]  Recommendations for primary care physician for things to follow:   Follow with PCP in 1 week   Admission Diagnosis  Shock (HCC) [R57.9] Acute GI bleeding [K92.2]   Discharge Diagnosis  Shock (HCC) [R57.9] Acute GI bleeding [K92.2]    Active Problems:   Upper GI bleed   Acute gastric ulcer with hemorrhage   Melena      Past Medical History:  Diagnosis Date  . DM2 (diabetes mellitus, type 2) (HCC)   . HIV infection (HCC)   . Hyperlipemia   . Hypertension   . Immune deficiency disorder (HCC)    PT reports HIV    Past Surgical History:  Procedure Laterality Date  . ESOPHAGOGASTRODUODENOSCOPY (EGD) WITH PROPOFOL N/A 05/24/2017   Procedure: ESOPHAGOGASTRODUODENOSCOPY (EGD) WITH PROPOFOL;  Surgeon: [name], MD;  Location: [facility];  Service: Endoscopy;  Laterality: N/A;  . none         History of present illness and  Hospital Course:     Kindly see H&P for history of present illness and admission details, please review complete Labs, Consult reports and Test reports for all details in brief  HPI  from the history and physical done on the day of admission 57 year old male patient admitted for generalized weakness, lightheadedness, near syncope and found to have a new black stools, admitted for GI bleed, patient also was hypotensive blood pressure 79/49 on admission.   Hospital Course  #1. upper GI bleed: Patient received 2 units of packed RBC due to hypotension, admitted to hospitalist service, started on Protonix drip, seen by gastroenterology, patient initial hemoglobin 10.1 and dropped  to 9.4 after 2 units of blood transfusion.  Patient does use naproxen 2 times a day because of joint pains.  Patient was taken for endoscopy, EGD showed 3 nonbleeding gastric ulcers with visible vessels in antrum and they areinjected with epinephrine and hemostatic clips placed.  Patient had normal esophagus.  After the EGD patient did not have further bleeding, tolerated a clear liquid and then advance to  soft diet.  Patient hemoglobin stayed stable around 9.  Discharged home in stable condition with PPI, advised the patient to stay away from NSAID use.  According to GI it is okay for him to take aspirin.  Resume aspirin at discharge,.  2.  Hemorrhagic shock on admission with hypotension, GI bleed: Improved with fluids, blood transfusion. 3.  HIV infection: Continued on antiretrovirals. 4.  Diabetes mellitus type 2: Continue metformin. #5 gum pain without evidence of infection, patient need to see dentist, given prescription for Augmentin while he gets appointment to see dentist he complained of swelling in the gums on the day of discharge but there is no evidence of infection no pus and no swelling.  Discharge Condition: Stable  Follow UP  Follow-up Information    [facility], [facility]. Schedule an appointment as soon as possible for a visit in 1 week(s).   Specialty:  Family Medicine Contact information: [address] [place] [address] [phone]        [name], MD. Go on 06/17/2017.   Why:  @2 :00 PM Contact information: [address]  [address] ([facility])  [place]. [phone]  Discharge Instructions  and  Discharge Medications     Allergies as of 05/26/2017   No Known Allergies     Medication List    STOP taking these medications   NAPROXEN DR 500 MG EC tablet Generic drug:  naproxen   ranitidine 150 MG tablet Commonly known as:  ZANTAC     TAKE these medications   amoxicillin-clavulanate  875-125 MG tablet Commonly known as:  AUGMENTIN Take 1 tablet by mouth 2 (two) times daily for 14 days.   ASPIR-81 PO Take 81 mg by mouth daily.   atorvastatin 40 MG tablet Commonly known as:  LIPITOR Take 1 tablet by mouth daily.   BIKTARVY 50-200-25 MG Tabs tablet Generic drug:  bictegravir-emtricitabine-tenofovir AF Take 1 tablet by mouth daily.   ferrous sulfate 325 (65 FE) MG tablet Take 1 tablet (325 mg total) by mouth 2 (two) times daily with a meal.   lisinopril 20 MG tablet Commonly known as:  PRINIVIL,ZESTRIL Take 1 tablet by mouth daily.   metFORMIN 500 MG tablet Commonly known as:  GLUCOPHAGE Take 1 tablet by mouth daily.   oxyCODONE-acetaminophen 5-325 MG tablet Commonly known as:  PERCOCET/ROXICET Take 1 tablet by mouth every 6 (six) hours as needed for moderate pain.   pantoprazole 40 MG tablet Commonly known as:  PROTONIX Take 1 tablet (40 mg total) by mouth 2 (two) times daily.   sucralfate 1 GM/10ML suspension Commonly known as:  CARAFATE Take 10 mLs (1 g total) by mouth 4 (four) times daily -  with meals and at bedtime.         Diet and Activity recommendation: See Discharge Instructions above   Consults obtained -gastroenterology   Major procedures and Radiology Reports - PLEASE review detailed and final reports for all details, in brief -     No results found.  Micro Results     No results found for this or any previous visit (from the past 240 hour(s)).     Today   Subjective:   Lauris Chromanalvert Ask today has no headache,no chest abdominal pain,no new weakness tingling or numbness, feels much better wants to go home today.   Objective:   Blood pressure 135/69, pulse 66, temperature 98.5 F (36.9 C), temperature source Oral, resp. rate 20, height 6\' 4"  (1.93 m), weight 108.9 kg (240 lb 3 oz), SpO2 100 %.  No intake or output data in the 24 hours ending 05/29/17 2002  Exam Awake Alert, Oriented x 3, No new F.N deficits, Normal  affect [place].AT,PERRAL Supple Neck,No JVD, No cervical lymphadenopathy appriciated.  Symmetrical Chest wall movement, Good air movement bilaterally, CTAB RRR,No Gallops,Rubs or new Murmurs, No Parasternal Heave +ve B.Sounds, Abd Soft, Non tender, No organomegaly appriciated, No rebound -guarding or rigidity. No Cyanosis, Clubbing or edema, No new Rash or bruise  Data Review   CBC w Diff:  Lab Results  Component Value Date   WBC 7.1 05/26/2017   HGB 9.2 (L) 05/26/2017   HGB 13.6 11/03/2013   HCT 27.1 (L) 05/26/2017   HCT 40.7 11/03/2013   PLT 150 05/26/2017   PLT 238 11/03/2013   LYMPHOPCT 36 05/23/2017   MONOPCT 8 05/23/2017   EOSPCT 0 05/23/2017   BASOPCT 0 05/23/2017    CMP:  Lab Results  Component Value Date   NA 142 05/24/2017   NA 140 11/03/2013   K 3.7 05/24/2017   K 3.9 11/03/2013   CL 115 (H) 05/24/2017   CL 108 (H) 11/03/2013   CO2  21 (L) 05/24/2017   CO2 26 11/03/2013   BUN 36 (H) 05/24/2017   BUN 11 11/03/2013   CREATININE 0.90 05/24/2017   CREATININE 0.86 11/03/2013   PROT 6.4 (L) 05/23/2017   ALBUMIN 3.3 (L) 05/23/2017   BILITOT 1.2 05/23/2017   ALKPHOS 37 (L) 05/23/2017   AST 19 05/23/2017   ALT 13 (L) 05/23/2017  .   Total Time in preparing paper work, data evaluation and todays exam - 35 minutes  Katha Hamming M.D on 05/26/2017 at 8:02 PM    Note: This dictation was prepared with Dragon dictation along with smaller phrase technology. Any transcriptional errors that result from this process are unintentional.

## 2017-05-29 NOTE — Telephone Encounter (Signed)
Patrick Galloway has a up coming appt and is still having stomach problems. Can Dr. Servando SnareWohl give him a note to stay out of work until after his appt?

## 2017-05-30 ENCOUNTER — Telehealth: Payer: Self-pay | Admitting: *Deleted

## 2017-05-30 NOTE — Telephone Encounter (Signed)
Left vm for pt to return my call.  

## 2017-05-30 NOTE — Telephone Encounter (Signed)
Patient had a procedure on 05/24/17 and is having some stomach problems and had some questions to ask the nurse

## 2017-06-03 MED ORDER — CARAFATE 100 MG/ML ORAL SUSPENSION
3 refills | 0 days | Status: CP
Start: 2017-06-03 — End: 2017-07-01

## 2017-06-06 ENCOUNTER — Ambulatory Visit: Admit: 2017-06-06 | Discharge: 2017-06-07 | Payer: MEDICAID | Attending: Family Medicine | Primary: Family Medicine

## 2017-06-06 DIAGNOSIS — K274 Chronic or unspecified peptic ulcer, site unspecified, with hemorrhage: Secondary | ICD-10-CM

## 2017-06-06 DIAGNOSIS — E1159 Type 2 diabetes mellitus with other circulatory complications: Principal | ICD-10-CM

## 2017-06-06 DIAGNOSIS — I10 Essential (primary) hypertension: Secondary | ICD-10-CM

## 2017-06-11 ENCOUNTER — Ambulatory Visit: Admit: 2017-06-11 | Discharge: 2017-06-12 | Payer: MEDICAID

## 2017-06-11 DIAGNOSIS — R0789 Other chest pain: Principal | ICD-10-CM

## 2017-06-11 DIAGNOSIS — E782 Mixed hyperlipidemia: Secondary | ICD-10-CM

## 2017-06-11 DIAGNOSIS — Z21 Asymptomatic human immunodeficiency virus [HIV] infection status: Secondary | ICD-10-CM

## 2017-06-11 DIAGNOSIS — I10 Essential (primary) hypertension: Secondary | ICD-10-CM

## 2017-06-11 DIAGNOSIS — E1159 Type 2 diabetes mellitus with other circulatory complications: Secondary | ICD-10-CM

## 2017-06-11 DIAGNOSIS — E1169 Type 2 diabetes mellitus with other specified complication: Secondary | ICD-10-CM

## 2017-06-11 DIAGNOSIS — E669 Obesity, unspecified: Secondary | ICD-10-CM

## 2017-06-13 ENCOUNTER — Ambulatory Visit: Payer: BLUE CROSS/BLUE SHIELD | Admitting: Psychiatry

## 2017-06-13 ENCOUNTER — Other Ambulatory Visit: Payer: Self-pay

## 2017-06-13 ENCOUNTER — Encounter: Payer: Self-pay | Admitting: Psychiatry

## 2017-06-13 VITALS — BP 136/81 | HR 67 | Temp 98.8°F | Wt 240.8 lb

## 2017-06-13 DIAGNOSIS — F1220 Cannabis dependence, uncomplicated: Secondary | ICD-10-CM

## 2017-06-13 DIAGNOSIS — F17200 Nicotine dependence, unspecified, uncomplicated: Secondary | ICD-10-CM | POA: Diagnosis not present

## 2017-06-13 DIAGNOSIS — F331 Major depressive disorder, recurrent, moderate: Secondary | ICD-10-CM | POA: Diagnosis not present

## 2017-06-13 MED ORDER — QUETIAPINE FUMARATE 25 MG PO TABS: 25.0000 mg | ORAL_TABLET | Freq: Every day | ORAL | 1 refills | Status: DC

## 2017-06-13 MED ORDER — CITALOPRAM HYDROBROMIDE 10 MG PO TABS
10.0000 mg | ORAL_TABLET | Freq: Every day | ORAL | 1 refills | Status: DC
Start: 2017-06-13 — End: 2017-07-11
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
## 2017-06-13 NOTE — Patient Instructions (Addendum)
Cannabis Use Disorder Cannabis use disorder is when using marijuana disrupts a person's daily life or causes health problems. This condition can be dangerous. The health problems this condition can cause include:  Long-lasting problems with thinking and learning. These can be permanent in young people.  Severe anxiety.  Paranoia.  Hallucinations.  Dangerously high blood pressure and heart rate.  Schizophrenia.  Breathing problems.  Problems with child development during and after pregnancy.  People with this condition are also more likely to use other drugs. What are the causes? This condition is caused by using marijuana too much over time. It is not caused by using it only once in a while. Many people with this condition use marijuana because it gives them a feeling of extreme pleasure or relaxation. What increases the risk? This condition is more likely to develop in:  Men.  People with a family history of cannabis use disorder.  People with mental health issues such as depression or post-traumatic stress disorder.  What are the signs or symptoms? Symptoms of this condition include:  Using greater amounts of marijuana than you want to, or using marijuana for longer than you want to.  Craving marijuana.  Spending a lot of time getting marijuana and using it or recovering from its effects.  Having problems at work, at school, at home, or in relationships because of marijuana use.  Giving up or cutting down on important life activities because of marijuana use.  Using marijuana at times when it is dangerous, such as while you are driving a car.  Needing more and more marijuana to get the same effect you want from the marijuana (building up a tolerance).  Physical problems, such as: ? A long-lasting cough. ? Bronchitis. ? Emphysema. ? Throat and lung cancer.  Mental problems, such as: ? Psychosis. ? Anxiety. ? Trouble sleeping.  Having symptoms  of withdrawal when you stop using marijuana. Symptom of withdrawal include: ? Irritability or anger. ? Anxiety or restlessness. ? Trouble sleeping. ? Loss of appetite or weight loss. ? Aches and pains. ? Shakiness, sweating, or chills.  How is this diagnosed? This condition is diagnosed with an assessment. During the assessment, your health care provider will ask about your marijuana use and about how it affects your life. You will be diagnosed with the condition if you have had at least two symptoms of this condition within a 642-month period. How severe the condition is depends on how many symptoms you have.  If you have two to three symptoms, your condition is mild.  If you have four to five symptoms, your condition is moderate.  If you have six or more symptoms, your condition is severe.  Your health care provider may perform a physical exam or do lab tests to see if you have physical problems resulting from marijuana use. Your health care provider may also screen for drug use and refer you to a mental health professional for evaluation. How is this treated? Treatment for this condition is usually provided by mental health professionals with training in substance use disorders. Your treatment may involve:  Counseling. This treatment is also called talk therapy. It is provided by substance use treatment counselors. A counselor can address the reasons you use marijuana and suggest ways to keep you from using it again. The goals of talk therapy are to: ? Find healthy activities to replace using marijuana. ? Identify and avoid the things that trigger your marijuana use. ?  Help you learn how to handle cravings.  Support groups. Support groups are led by people who have quit using marijuana. They provide emotional support, advice, and guidance.  Medicine. Medicine is used to treat mental health issues that trigger marijuana use or that result from it.  Follow these instructions at  home:  Take over-the-counter and prescription medicines only as told by your health care provider.  Check with your health care provider before starting any new medicines.  Keep all follow-up visits as told by your health care provider. This is important.  Work with Photographer or group to develop tools to keep you from using marijuana again (relapsing).  Make healthy lifestyle choices, such as: ? Eating a healthy diet. ? Getting enough exercise. ? Improving your stress-management skills.  Learn daily living skills and work Programmer, applications. Where to find more information:  [employer] on Drug Abuse: http://www.Ruminski-smith.com/  Substance Abuse and Mental Health Services Administration: SkateOasis.com.pt Contact a health care provider if:  You are not able to take your medicines as told.  Your symptoms get worse. Get help right away if:  You have serious thoughts about hurting yourself or others. If you ever feel like you may hurt yourself or others, or have thoughts about taking your own life, get help right away. You can go to your nearest emergency department or call:  Your local emergency services (911 in the [place]  A suicide crisis helpline, such as the National Suicide Prevention Lifeline at 952-549-4865. This is open 24 hours a day.  This information is not intended to replace advice given to you by your health care provider. Make sure you discuss any questions you have with your health care provider. Document Released: 04/19/2000 Document Revised: 01/19/2016 Document Reviewed: 01/19/2016 Elsevier Interactive Patient Education  2018 [employer]. Tobacco Use Disorder Tobacco use disorder (TUD) is a mental disorder. It is the long-term use of tobacco in spite of related health problems or difficulty with normal life activities. Tobacco is most commonly smoked as cigarettes and less commonly as cigars or pipes. Smokeless chewing tobacco and snuff are also popular. People with TUD  get a feeling of extreme pleasure (euphoria) from using tobacco and have a desire to use it again and again. Repeated use of tobacco can cause problems. The addictive effects of tobacco are due mainly tothe ingredient nicotine. Nicotine also causes a rush of adrenaline (epinephrine) in the body. This leads to increased blood pressure, heart rate, and breathing rate. These changes may cause problems for people with high blood pressure, weak hearts, or lung disease. High doses of nicotine in children and pets can lead to seizures and death. Tobacco contains a number of other unsafe chemicals. These chemicals are especially harmful when inhaled as smoke and can damage almost every organ in the body. Smokers live shorter lives than nonsmokers and are at risk of dying from a number of diseases and cancers. Tobacco smoke can also cause health problems for nonsmokers (due to inhaling secondhand smoke). Smoking is also a fire hazard. TUD usually starts in the late teenage years and is most common in young adults between the ages of 31 and 25 years. People who start smoking earlier in life are more likely to continue smoking as adults. TUD is somewhat more common in men than women. People with TUD are at higher risk for using alcohol and other drugs of abuse. What increases the risk? Risk factors for TUD include:  Having family members with the disorder.  Being around people who use tobacco.  Having an existing mental health issue such as schizophrenia, depression, bipolar disorder, ADHD, or posttraumatic stress disorder (PTSD).  What are the signs or symptoms? People with tobacco use disorder have two or more of the following signs and symptoms within 12 months:  Use of more tobacco over a longer period than intended.  Not able to cut down or control tobacco use.  A lot of time spent obtaining or using tobacco.  Strong desire or urge to use tobacco (craving). Cravings may last for 6 months or longer  after quitting.  Use of tobacco even when use leads to major problems at work, school, or home.  Use of tobacco even when use leads to relationship problems.  Giving up or cutting down on important life activities because of tobacco use.  Repeatedly using tobacco in situations where it puts you or others in physical danger, like smoking in bed.  Use of tobacco even when it is known that a physical or mental problem is likely related to tobacco use. ? Physical problems are numerous and may include chronic bronchitis, emphysema, lung and other cancers, gum disease, high blood pressure, heart disease, and stroke. ? Mental problems caused by tobacco may include difficulty sleeping and anxiety.  Need to use greater amounts of tobacco to get the same effect. This means you have developed a tolerance.  Withdrawal symptoms as a result of stopping or rapidly cutting back use. These symptoms may last a month or more after quitting and include the following: ? Depressed, anxious, or irritable mood. ? Difficulty concentrating. ? Increased appetite. ? Restlessness or trouble sleeping. ? Use of tobacco to avoid withdrawal symptoms.  How is this diagnosed? Tobacco use disorder is diagnosed by your health care provider. A diagnosis may be made by:  Your health care provider asking questions about your tobacco use and any problems it may be causing.  A physical exam.  Lab tests.  You may be referred to a mental health professional or addiction specialist.  The severity of tobacco use disorder depends on the number of signs and symptoms you have:  Mild-Two or three symptoms.  Moderate-Four or five symptoms.  Severe-Six or more symptoms.  How is this treated? Many people with tobacco use disorder are unable to quit on their own and need help. Treatment options include the following:  Nicotine replacement therapy (NRT). NRT provides nicotine without the other harmful chemicals in tobacco.  NRT gradually lowers the dosage of nicotine in the body and reduces withdrawal symptoms. NRT is available in over-the-counter forms (gum, lozenges, and skin patches) as well as prescription forms (mouth inhaler and nasal spray).  Medicines.This may include: ? Antidepressant medicine that may reduce nicotine cravings. ? A medicine that acts on nicotine receptors in the brain to reduce cravings and withdrawal symptoms. It may also block the effects of tobacco in people with TUD who relapse.  Counseling or talk therapy. A form of talk therapy called behavioral therapy is commonly used to treat people with TUD. Behavioral therapy looks at triggers for tobacco use, how to avoid them, and how to cope with cravings. It is most effective in person or by phone but is also available in self-help forms (books and Internet websites).  Support groups. These provide emotional support, advice, and guidance for quitting tobacco.  The most effective treatment for TUD is usually a combination of medicine, talk therapy, and support groups. Follow these instructions at home:  Keep all follow-up  visits as directed by your health care provider. This is important.  Take medicines only as directed by your health care provider.  Check with your health care provider before starting new prescription or over-the-counter medicines. Contact a health care provider if:  You are not able to take your medicines as prescribed.  Treatment is not helping your TUD and your symptoms get worse. Get help right away if:  You have serious thoughts about hurting yourself or others.  You have trouble breathing, chest pain, sudden weakness, or sudden numbness in part of your body. This information is not intended to replace advice given to you by your health care provider. Make sure you discuss any questions you have with your health care provider. Document Released: 12/27/2003 Document Revised: 12/24/2015 Document Reviewed:  06/18/2013 Elsevier Interactive Patient Education  2018 [employer]. Citalopram tablets What is this medicine? CITALOPRAM (sye TAL oh pram) is a medicine for depression. This medicine may be used for other purposes; ask your health care provider or pharmacist if you have questions. COMMON BRAND NAME(S): Celexa What should I tell my health care provider before I take this medicine? They need to know if you have any of these conditions: -bleeding disorders -bipolar disorder or a family history of bipolar disorder -glaucoma -heart disease -history of irregular heartbeat -kidney disease -liver disease -low levels of magnesium or potassium in the blood -receiving electroconvulsive therapy -seizures -suicidal thoughts, plans, or attempt; a previous suicide attempt by you or a family member -take medicines that treat or prevent blood clots -thyroid disease -an unusual or allergic reaction to citalopram, escitalopram, other medicines, foods, dyes, or preservatives -pregnant or trying to become pregnant -breast-feeding How should I use this medicine? Take this medicine by mouth with a glass of water. Follow the directions on the prescription label. You can take it with or without food. Take your medicine at regular intervals. Do not take your medicine more often than directed. Do not stop taking this medicine suddenly except upon the advice of your doctor. Stopping this medicine too quickly may cause serious side effects or your condition may worsen. A special MedGuide will be given to you by the pharmacist with each prescription and refill. Be sure to read this information carefully each time. Talk to your pediatrician regarding the use of this medicine in children. Special care may be needed. Patients over 7 years old may have a stronger reaction and need a smaller dose. Overdosage: If you think you have taken too much of this medicine contact a poison control center or emergency room at  once. NOTE: This medicine is only for you. Do not share this medicine with others. What if I miss a dose? If you miss a dose, take it as soon as you can. If it is almost time for your next dose, take only that dose. Do not take double or extra doses. What may interact with this medicine? Do not take this medicine with any of the following medications: -certain medicines for fungal infections like fluconazole, itraconazole, ketoconazole, posaconazole, voriconazole -cisapride -dofetilide -dronedarone -escitalopram -linezolid -MAOIs like Carbex, Eldepryl, Marplan, Nardil, and Parnate -methylene blue (injected into a vein) -pimozide -thioridazine -ziprasidone This medicine may also interact with the following medications: -alcohol -amphetamines -aspirin and aspirin-like medicines -carbamazepine -certain medicines for depression, anxiety, or psychotic disturbances -certain medicines for infections like chloroquine, clarithromycin, erythromycin, furazolidone, isoniazid, pentamidine -certain medicines for migraine headaches like almotriptan, eletriptan, frovatriptan, naratriptan, rizatriptan, sumatriptan, zolmitriptan -certain medicines for sleep -certain medicines that  treat or prevent blood clots like dalteparin, enoxaparin, warfarin -cimetidine -diuretics -fentanyl -lithium -methadone -metoprolol -NSAIDs, medicines for pain and inflammation, like ibuprofen or naproxen -omeprazole -other medicines that prolong the QT interval (cause an abnormal heart rhythm) -procarbazine -rasagiline -supplements like St. John's wort, kava kava, valerian -tramadol -tryptophan This list may not describe all possible interactions. Give your health care provider a list of all the medicines, herbs, non-prescription drugs, or dietary supplements you use. Also tell them if you smoke, drink alcohol, or use illegal drugs. Some items may interact with your medicine. What should I watch for while using  this medicine? Tell your doctor if your symptoms do not get better or if they get worse. Visit your doctor or health care professional for regular checks on your progress. Because it may take several weeks to see the full effects of this medicine, it is important to continue your treatment as prescribed by your doctor. Patients and their families should watch out for new or worsening thoughts of suicide or depression. Also watch out for sudden changes in feelings such as feeling anxious, agitated, panicky, irritable, hostile, aggressive, impulsive, severely restless, overly excited and hyperactive, or not being able to sleep. If this happens, especially at the beginning of treatment or after a change in dose, call your health care professional. Bonita Quin may get drowsy or dizzy. Do not drive, use machinery, or do anything that needs mental alertness until you know how this medicine affects you. Do not stand or sit up quickly, especially if you are an older patient. This reduces the risk of dizzy or fainting spells. Alcohol may interfere with the effect of this medicine. Avoid alcoholic drinks. Your mouth may get dry. Chewing sugarless gum or sucking hard candy, and drinking plenty of water will help. Contact your doctor if the problem does not go away or is severe. What side effects may I notice from receiving this medicine? Side effects that you should report to your doctor or health care professional as soon as possible: -allergic reactions like skin rash, itching or hives, swelling of the face, lips, or tongue -anxious -black, tarry stools -breathing problems -changes in vision -chest pain -confusion -elevated mood, decreased need for sleep, racing thoughts, impulsive behavior -eye pain -fast, irregular heartbeat -feeling faint or lightheaded, falls -feeling agitated, angry, or irritable -hallucination, loss of contact with reality -loss of balance or coordination -loss of memory -painful or  prolonged erections -restlessness, pacing, inability to keep still -seizures -stiff muscles -suicidal thoughts or other mood changes -trouble sleeping -unusual bleeding or bruising -unusually weak or tired -vomiting Side effects that usually do not require medical attention (report to your doctor or health care professional if they continue or are bothersome): -change in appetite or weight -change in sex drive or performance -dizziness -headache -increased sweating -indigestion, nausea -tremors This list may not describe all possible side effects. Call your doctor for medical advice about side effects. You may report side effects to FDA at 1-800-FDA-1088. Where should I keep my medicine? Keep out of reach of children. Store at room temperature between 15 and 30 degrees C (59 and 86 degrees F). Throw away any unused medicine after the expiration date. NOTE: This sheet is a summary. It may not cover all possible information. If you have questions about this medicine, talk to your doctor, pharmacist, or health care provider.  2018 Elsevier/Gold Standard (2015-09-25 13:18:52) Quetiapine tablets What is this medicine? QUETIAPINE (kwe TYE a peen) is an antipsychotic. It is used to  treat schizophrenia and bipolar disorder, also known as manic-depression. This medicine may be used for other purposes; ask your health care provider or pharmacist if you have questions. COMMON BRAND NAME(S): Seroquel What should I tell my health care provider before I take this medicine? They need to know if you have any of these conditions: -brain tumor or head injury -breast cancer -cataracts -diabetes -difficulty swallowing -heart disease -kidney disease -liver disease -low blood counts, like low white cell, platelet, or red cell counts -low blood pressure or dizziness when standing up -Parkinson's disease -previous heart attack -seizures -suicidal thoughts, plans, or attempt by you or a family  member -thyroid disease -an unusual or allergic reaction to quetiapine, other medicines, foods, dyes, or preservatives -pregnant or trying to get pregnant -breast-feeding How should I use this medicine? Take this medicine by mouth. Swallow it with a drink of water. Follow the directions on the prescription label. If it upsets your stomach you can take it with food. Take your medicine at regular intervals. Do not take it more often than directed. Do not stop taking except on the advice of your doctor or health care professional. A special MedGuide will be given to you by the pharmacist with each prescription and refill. Be sure to read this information carefully each time. Talk to your pediatrician regarding the use of this medicine in children. While this drug may be prescribed for children as young as 10 years for selected conditions, precautions do apply. Patients over age 46 years may have a stronger reaction to this medicine and need smaller doses. Overdosage: If you think you have taken too much of this medicine contact a poison control center or emergency room at once. NOTE: This medicine is only for you. Do not share this medicine with others. What if I miss a dose? If you miss a dose, take it as soon as you can. If it is almost time for your next dose, take only that dose. Do not take double or extra doses. What may interact with this medicine? Do not take this medicine with any of the following medications: -certain medicines for fungal infections like fluconazole, itraconazole, ketoconazole, posaconazole, voriconazole -cisapride -dofetilide -dronedarone -droperidol -grepafloxacin -halofantrine -phenothiazines like chlorpromazine, mesoridazine, thioridazine -pimozide -sparfloxacin -ziprasidone This medicine may also interact with the following medications: -alcohol -antiviral medicines for HIV or AIDS -certain medicines for blood pressure -certain medicines for depression,  anxiety, or psychotic disturbances like haloperidol, lorazepam -certain medicines for diabetes -certain medicines for Parkinson's disease -certain medicines for seizures like carbamazepine, phenobarbital, phenytoin -cimetidine -erythromycin -other medicines that prolong the QT interval (cause an abnormal heart rhythm) -rifampin -steroid medicines like prednisone or cortisone This list may not describe all possible interactions. Give your health care provider a list of all the medicines, herbs, non-prescription drugs, or dietary supplements you use. Also tell them if you smoke, drink alcohol, or use illegal drugs. Some items may interact with your medicine. What should I watch for while using this medicine? Visit your doctor or health care professional for regular checks on your progress. It may be several weeks before you see the full effects of this medicine. Your health care provider may suggest that you have your eyes examined prior to starting this medicine, and every 6 months thereafter. If you have been taking this medicine regularly for some time, do not suddenly stop taking it. You must gradually reduce the dose or your symptoms may get worse. Ask your doctor or health care professional for advice.  Patients and their families should watch out for worsening depression or thoughts of suicide. Also watch out for sudden or severe changes in feelings such as feeling anxious, agitated, panicky, irritable, hostile, aggressive, impulsive, severely restless, overly excited and hyperactive, or not being able to sleep. If this happens, especially at the beginning of antidepressant treatment or after a change in dose, call your health care professional. Bonita Quin may get dizzy or drowsy. Do not drive, use machinery, or do anything that needs mental alertness until you know how this medicine affects you. Do not stand or sit up quickly, especially if you are an older patient. This reduces the risk of dizzy or  fainting spells. Alcohol can increase dizziness and drowsiness. Avoid alcoholic drinks. Do not treat yourself for colds, diarrhea or allergies. Ask your doctor or health care professional for advice, some ingredients may increase possible side effects. This medicine can reduce the response of your body to heat or cold. Dress warm in cold weather and stay hydrated in hot weather. If possible, avoid extreme temperatures like saunas, hot tubs, very hot or cold showers, or activities that can cause dehydration such as vigorous exercise. What side effects may I notice from receiving this medicine? Side effects that you should report to your doctor or health care professional as soon as possible: -allergic reactions like skin rash, itching or hives, swelling of the face, lips, or tongue -difficulty swallowing -fast or irregular heartbeat -fever or chills, sore throat -fever with rash, swollen lymph nodes, or swelling of the face -increased hunger or thirst -increased urination -problems with balance, talking, walking -seizures -stiff muscles -suicidal thoughts or other mood changes -uncontrollable head, mouth, neck, arm, or leg movements -unusually weak or tired Side effects that usually do not require medical attention (report to your doctor or health care professional if they continue or are bothersome): -change in sex drive or performance -constipation -drowsy or dizzy -dry mouth -stomach upset -weight gain This list may not describe all possible side effects. Call your doctor for medical advice about side effects. You may report side effects to FDA at 1-800-FDA-1088. Where should I keep my medicine? Keep out of the reach of children. Store at room temperature between 15 and 30 degrees C (59 and 86 degrees F). Throw away any unused medicine after the expiration date. NOTE: This sheet is a summary. It may not cover all possible information. If you have questions about this medicine, talk to  your doctor, pharmacist, or health care provider.  2018 Elsevier/Gold Standard (2014-10-25 13:07:35)  ........................................................................................................................................................................................  Crisis Hotline ARPA Numbers  [facility] [phone]  National Suicide Hotline - 1800-273-TALK or 1-800-SUICIDE  Crisis Text Line - Text HOME to [phone]  Crisis chat - Lifeline Chat   911

## 2017-06-17 ENCOUNTER — Encounter: Payer: Self-pay | Admitting: Gastroenterology

## 2017-06-17 ENCOUNTER — Ambulatory Visit: Payer: BLUE CROSS/BLUE SHIELD | Admitting: Gastroenterology

## 2017-06-17 VITALS — BP 123/77 | HR 71 | Ht 76.0 in | Wt 241.8 lb

## 2017-06-17 DIAGNOSIS — K250 Acute gastric ulcer with hemorrhage: Secondary | ICD-10-CM | POA: Diagnosis not present

## 2017-06-17 NOTE — Progress Notes (Signed)
Primary Care Physician: Lossie FaesSwygard, Heidi, MD  Primary Gastroenterologist:  Dr. Midge Miniumarren Erven Ramson  Chief Complaint  Patient presents with  . Hospitalization Follow-up  . GI Bleeding    HPI: Patrick Galloway is a 57 y.o. male here for follow-up after having a gastric ulcer bleed. The patient states that he has had no further sign of any GI bleeding.  He denies any abdominal pain which she was having in the hospital associated with his GI bleeding.  There is no report of any unexplained weight loss fevers chills nausea vomiting.  Current Outpatient Medications  Medication Sig Dispense Refill  . Aspirin (ASPIR-81 PO) Take 81 mg by mouth daily.    Marland Kitchen. atorvastatin (LIPITOR) 40 MG tablet Take 1 tablet by mouth daily.    Marland Kitchen. BIKTARVY 50-200-25 MG TABS tablet Take 1 tablet by mouth daily.    . citalopram (CELEXA) 10 MG tablet Take 1 tablet (10 mg total) by mouth daily with breakfast. 30 tablet 1  . ferrous sulfate 325 (65 FE) MG tablet Take 1 tablet (325 mg total) by mouth 2 (two) times daily with a meal. 60 tablet 0  . lisinopril (PRINIVIL,ZESTRIL) 20 MG tablet Take 1 tablet by mouth daily.    . metFORMIN (GLUCOPHAGE) 500 MG tablet Take 1 tablet by mouth daily.    . pantoprazole (PROTONIX) 40 MG tablet Take 1 tablet (40 mg total) by mouth 2 (two) times daily. 60 tablet 0  . QUEtiapine (SEROQUEL) 25 MG tablet Take 1 tablet (25 mg total) by mouth at bedtime. FOR SLEEP AS WELL AS MOOD , PSYCHOSIS 30 tablet 1  . sucralfate (CARAFATE) 1 GM/10ML suspension Take 10 mLs (1 g total) by mouth 4 (four) times daily -  with meals and at bedtime. 420 mL 0  . oxyCODONE-acetaminophen (PERCOCET/ROXICET) 5-325 MG tablet Take 1 tablet by mouth every 6 (six) hours as needed for moderate pain. (Patient not taking: Reported on 06/13/2017) 20 tablet 0   No current facility-administered medications for this visit.     Allergies as of 06/17/2017  . (No Known Allergies)    ROS:  General: Negative for anorexia, weight loss,  fever, chills, fatigue, weakness. ENT: Negative for hoarseness, difficulty swallowing , nasal congestion. CV: Negative for chest pain, angina, palpitations, dyspnea on exertion, peripheral edema.  Respiratory: Negative for dyspnea at rest, dyspnea on exertion, cough, sputum, wheezing.  GI: See history of present illness. GU:  Negative for dysuria, hematuria, urinary incontinence, urinary frequency, nocturnal urination.  Endo: Negative for unusual weight change.    Physical Examination:   BP 123/77   Pulse 71   Ht 6\' 4"  (1.93 m)   Wt 241 lb 12.8 oz (109.7 kg)   BMI 29.43 kg/m   General: Well-nourished, well-developed in no acute distress.  Eyes: No icterus. Conjunctivae pink. Mouth: Oropharyngeal mucosa moist and pink , no lesions erythema or exudate. Lungs: Clear to auscultation bilaterally. Non-labored. Heart: Regular rate and rhythm, no murmurs rubs or gallops.  Abdomen: Bowel sounds are normal, nontender, nondistended, no hepatosplenomegaly or masses, no abdominal bruits or hernia , no rebound or guarding.   Extremities: No lower extremity edema. No clubbing or deformities. Neuro: Alert and oriented x 3.  Grossly intact. Skin: Warm and dry, no jaundice.   Psych: Alert and cooperative, normal mood and affect.  Labs:    Imaging Studies: No results found.  Assessment and Plan:   Patrick Galloway is a 57 y.o. y/o male Who comes in today after being  discharged from  The hospital and found to have a gastric ulcer with a visible vessel.  The visible vessel was clipped with endoclips.  The patient will continue his Protonix.  The patient has also been told to contact me if he has any further signs of any GI bleeding.    [name], MD. Clementeen Graham   Note: This dictation was prepared with Dragon dictation along with smaller phrase technology. Any transcriptional errors that result from this process are unintentional.

## 2017-06-23 ENCOUNTER — Telehealth: Payer: Self-pay | Admitting: Gastroenterology

## 2017-06-23 NOTE — Telephone Encounter (Signed)
LVM for pt to return my call.

## 2017-06-23 NOTE — Telephone Encounter (Signed)
Patient called & l/m stating something was wrong with his stool?  Message unclear. Please call.

## 2017-06-24 ENCOUNTER — Ambulatory Visit: Payer: Self-pay | Admitting: Licensed Clinical Social Worker

## 2017-06-30 MED ORDER — FERROUS SULFATE 325 MG (65 MG IRON) TABLET
ORAL_TABLET | 6 refills | 0 days | Status: CP
Start: 2017-06-30 — End: 2018-10-01

## 2017-06-30 MED ORDER — PANTOPRAZOLE 40 MG TABLET,DELAYED RELEASE
ORAL_TABLET | 6 refills | 0 days | Status: CP
Start: 2017-06-30 — End: 2017-12-26

## 2017-07-01 ENCOUNTER — Ambulatory Visit: Admit: 2017-07-01 | Discharge: 2017-07-02 | Payer: MEDICAID | Attending: Specialist | Primary: Specialist

## 2017-07-01 DIAGNOSIS — E1159 Type 2 diabetes mellitus with other circulatory complications: Secondary | ICD-10-CM

## 2017-07-01 DIAGNOSIS — I10 Essential (primary) hypertension: Secondary | ICD-10-CM

## 2017-07-01 DIAGNOSIS — E119 Type 2 diabetes mellitus without complications: Principal | ICD-10-CM

## 2017-07-01 DIAGNOSIS — B20 Human immunodeficiency virus [HIV] disease: Secondary | ICD-10-CM

## 2017-07-09 ENCOUNTER — Encounter (INDEPENDENT_AMBULATORY_CARE_PROVIDER_SITE_OTHER): Payer: Self-pay

## 2017-07-09 ENCOUNTER — Other Ambulatory Visit: Payer: Self-pay

## 2017-07-09 ENCOUNTER — Ambulatory Visit: Payer: BLUE CROSS/BLUE SHIELD | Admitting: Gastroenterology

## 2017-07-09 ENCOUNTER — Encounter: Payer: Self-pay | Admitting: Gastroenterology

## 2017-07-09 ENCOUNTER — Ambulatory Visit
Admission: RE | Admit: 2017-07-09 | Discharge: 2017-07-09 | Disposition: A | Discharge status: Home / Self Care | Payer: BLUE CROSS/BLUE SHIELD | Source: Ambulatory Visit | Attending: Gastroenterology | Admitting: Gastroenterology

## 2017-07-09 ENCOUNTER — Ambulatory Visit: Admit: 2017-07-09 | Discharge: 2017-07-09 | Payer: MEDICAID

## 2017-07-09 ENCOUNTER — Ambulatory Visit: Admit: 2017-07-09 | Discharge: 2017-07-09 | Payer: MEDICAID | Attending: Family Medicine | Primary: Family Medicine

## 2017-07-09 VITALS — BP 137/87 | HR 65 | Temp 98.1°F | Ht 76.0 in | Wt 245.5 lb

## 2017-07-09 DIAGNOSIS — M7918 Myalgia, other site: Secondary | ICD-10-CM

## 2017-07-09 DIAGNOSIS — K259 Gastric ulcer, unspecified as acute or chronic, without hemorrhage or perforation: Secondary | ICD-10-CM | POA: Insufficient documentation

## 2017-07-09 DIAGNOSIS — Z86010 Personal history of colonic polyps: Secondary | ICD-10-CM

## 2017-07-09 DIAGNOSIS — K253 Acute gastric ulcer without hemorrhage or perforation: Secondary | ICD-10-CM | POA: Diagnosis not present

## 2017-07-09 DIAGNOSIS — K250 Acute gastric ulcer with hemorrhage: Secondary | ICD-10-CM

## 2017-07-09 DIAGNOSIS — B20 Human immunodeficiency virus [HIV] disease: Principal | ICD-10-CM

## 2017-07-09 NOTE — Progress Notes (Signed)
Primary Care Physician: Lossie FaesSwygard, Heidi, MD  Primary Gastroenterologist:  Dr. Midge Miniumarren Asja Frommer  Chief Complaint  Patient presents with  . gastric ulcer follow up    HPI: Patrick Galloway is a 57 y.o. male here for follow-up of peptic ulcer disease.  The patient had a gastric ulcer with endoclips placed to avoid bleeding. The patient reports that he has some lower abdominal pain bilaterally.  He reports the pain to be worse when he coughs.  The patient also has a history of colon polyps and was recommended to have a repeat colonoscopy this year.  Current Outpatient Medications  Medication Sig Dispense Refill  . atorvastatin (LIPITOR) 40 MG tablet Take 1 tablet by mouth daily.    Marland Kitchen. BIKTARVY 50-200-25 MG TABS tablet Take 1 tablet by mouth daily.    . citalopram (CELEXA) 10 MG tablet Take 1 tablet (10 mg total) by mouth daily with breakfast. 30 tablet 1  . ferrous sulfate 325 (65 FE) MG tablet Take 1 tablet (325 mg total) by mouth 2 (two) times daily with a meal. 60 tablet 0  . lisinopril (PRINIVIL,ZESTRIL) 20 MG tablet Take 1 tablet by mouth daily.    . metFORMIN (GLUCOPHAGE) 500 MG tablet Take 1 tablet by mouth daily.    . pantoprazole (PROTONIX) 40 MG tablet Take 1 tablet (40 mg total) by mouth 2 (two) times daily. 60 tablet 0  . QUEtiapine (SEROQUEL) 25 MG tablet Take 1 tablet (25 mg total) by mouth at bedtime. FOR SLEEP AS WELL AS MOOD , PSYCHOSIS 30 tablet 1  . Aspirin (ASPIR-81 PO) Take 81 mg by mouth daily.    . sucralfate (CARAFATE) 1 GM/10ML suspension Take 10 mLs (1 g total) by mouth 4 (four) times daily -  with meals and at bedtime. (Patient not taking: Reported on 07/09/2017) 420 mL 0   No current facility-administered medications for this visit.     Allergies as of 07/09/2017  . (No Known Allergies)    ROS:  General: Negative for anorexia, weight loss, fever, chills, fatigue, weakness. ENT: Negative for hoarseness, difficulty swallowing , nasal congestion. CV: Negative for  chest pain, angina, palpitations, dyspnea on exertion, peripheral edema.  Respiratory: Negative for dyspnea at rest, dyspnea on exertion, cough, sputum, wheezing.  GI: See history of present illness. GU:  Negative for dysuria, hematuria, urinary incontinence, urinary frequency, nocturnal urination.  Endo: Negative for unusual weight change.    Physical Examination:   BP 137/87   Pulse 65   Temp 98.1 F (36.7 C) (Oral)   Ht 6\' 4"  (1.93 m)   Wt 245 lb 8 oz (111.4 kg)   BMI 29.88 kg/m   General: Well-nourished, well-developed in no acute distress.  Eyes: No icterus. Conjunctivae pink. Mouth: Oropharyngeal mucosa moist and pink , no lesions erythema or exudate. Lungs: Clear to auscultation bilaterally. Non-labored. Heart: Regular rate and rhythm, no murmurs rubs or gallops.  Abdomen: Bowel sounds are normal, Positive one finger palpation tenderness while flexing the abdominal wall muscles, nondistended, no hepatosplenomegaly or masses, no abdominal bruits or hernia , no rebound or guarding.   Extremities: No lower extremity edema. No clubbing or deformities. Neuro: Alert and oriented x 3.  Grossly intact. Skin: Warm and dry, no jaundice.   Psych: Alert and cooperative, normal mood and affect.  Labs:    Imaging Studies: No results found.  Assessment and Plan:   Patrick Galloway is a 57 y.o. y/o male Who was in the hospital with a  upper GI bleed and clips placed on a gastric ulcer.  The patient has avoided anti-inflammatory medications.  The patient has also had some lower abdominal pain that is clearly musculoskeletal and made worse with flexion of the abdominal wall muscles. The patient has been told to use warm compresses and avoid straining the muscles further.  The patient will also be set up for repeat EGD to assess the gastric ulcer and a colonoscopy due to his history of polyps. I have discussed risks & benefits which include, but are not limited to, bleeding, infection,  perforation & drug reaction.  The patient agrees with this plan & written consent will be obtained.       [name], MD. Clementeen Graham   Note: This dictation was prepared with Dragon dictation along with smaller phrase technology. Any transcriptional errors that result from this process are unintentional.

## 2017-07-09 NOTE — Addendum Note (Signed)
Addended by: Avie ArenasTEGA, Donald Memoli S on: 07/09/2017 12:36 PM   Modules accepted: Orders

## 2017-07-10 ENCOUNTER — Telehealth: Payer: Self-pay | Admitting: Gastroenterology

## 2017-07-10 NOTE — Telephone Encounter (Signed)
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

## 2017-07-11 ENCOUNTER — Encounter: Payer: Self-pay | Admitting: Psychiatry

## 2017-07-11 ENCOUNTER — Ambulatory Visit (INDEPENDENT_AMBULATORY_CARE_PROVIDER_SITE_OTHER): Payer: BLUE CROSS/BLUE SHIELD | Admitting: Psychiatry

## 2017-07-11 ENCOUNTER — Ambulatory Visit: Admit: 2017-07-11 | Discharge: 2017-07-12 | Payer: MEDICAID | Attending: Family Medicine | Primary: Family Medicine

## 2017-07-11 VITALS — BP 140/85 | HR 61 | Ht 76.5 in | Wt 242.5 lb

## 2017-07-11 DIAGNOSIS — F17200 Nicotine dependence, unspecified, uncomplicated: Secondary | ICD-10-CM

## 2017-07-11 DIAGNOSIS — B20 Human immunodeficiency virus [HIV] disease: Secondary | ICD-10-CM | POA: Insufficient documentation

## 2017-07-11 DIAGNOSIS — F1210 Cannabis abuse, uncomplicated: Secondary | ICD-10-CM | POA: Diagnosis not present

## 2017-07-11 DIAGNOSIS — F331 Major depressive disorder, recurrent, moderate: Secondary | ICD-10-CM

## 2017-07-11 DIAGNOSIS — K274 Chronic or unspecified peptic ulcer, site unspecified, with hemorrhage: Principal | ICD-10-CM

## 2017-07-11 MED ORDER — QUETIAPINE FUMARATE 25 MG PO TABS: 25.0000 mg | ORAL_TABLET | Freq: Every day | ORAL | 1 refills | Status: DC

## 2017-07-11 MED ORDER — CITALOPRAM HYDROBROMIDE 10 MG PO TABS: 10.0000 mg | ORAL_TABLET | Freq: Every day | ORAL | 1 refills | Status: DC

## 2017-07-11 NOTE — Progress Notes (Signed)
BH MD OP Progress Note  07/11/2017 9:01 AM Patrick Galloway  MRN:  [id number]  Chief Complaint: ' I am better." Chief Complaint    Follow-up     HPI: Patrick Galloway is a 57 year old African-American male, who is single, employed, lives in [place], history of depression, cannabis use disorder, tobacco use disorder, HIV positive, GI ulcer, presented to the clinic today for a follow-up visit.  Addiel today reports his mood symptoms are improving.  He reports his sister moved in to [place] from [place].  Patient reports his sister is 53 years old and has medical problems like blindness.  He reports she was being mistreated by her daughter and hence he decided to help her out.  Patient reports he drives up to her 3 times a day to help her with her meals as well as to check her blood sugar and give her insulin.  Patient reports that doing so has helped him to feel better a lot.  He also reports that his medications like Celexa and Seroquel are helping him.  He is tolerating the medications well.  He denies any side effects.  He reports he does not have any suicidal thoughts or homicidal thoughts.  He reports he has not gotten back to work yet.  He may be going back this coming Monday.  He reports he plans to talk to HR about the workplace harassment.  He reports he is trying to quit smoking.  He has a nicotine patch on at this time.  He continues to use cannabis on and off.  He denies any other concerns today.   Visit Diagnosis:    ICD-10-CM   1. MDD (major depressive disorder), recurrent episode, moderate (HCC) F33.1 citalopram (CELEXA) 10 MG tablet    QUEtiapine (SEROQUEL) 25 MG tablet  2. Tobacco use disorder F17.200   3. Cannabis use disorder, mild, abuse F12.10     Past Psychiatric History: Denies past history of mental health treatment.  Denies past history of suicide attempts.  Past Medical History:  Past Medical History:  Diagnosis Date  . Arthritis    hips and knees  . DM2  (diabetes mellitus, type 2) (HCC)   . HIV infection (HCC)   . Hyperlipemia   . Hypertension   . Immune deficiency disorder (HCC)    PT reports HIV  . Seizures (HCC)   . Sleep apnea     Past Surgical History:  Procedure Laterality Date  . ESOPHAGOGASTRODUODENOSCOPY (EGD) WITH PROPOFOL N/A 05/24/2017   Procedure: ESOPHAGOGASTRODUODENOSCOPY (EGD) WITH PROPOFOL;  Surgeon: [name], MD;  [facility] ENDOSCOPY;  Service: Endoscopy;  Laterality: N/A;  . none      Family Psychiatric History: Denies history of mental health problems in his family.  Denies history of suicide in his family.  Family History:  Family History  Problem Relation Age of Onset  . Heart failure Mother   . Heart failure Father    Substance abuse history: Has been using cannabis since the age of 57 years old.  Reports he uses it for a few times a week.  Social History: Single, he lives in [place].  Graduated high school, special classes.  He lives with a roommate who pays him rent and is also emotionally supportive.  His parents are both deceased.  He is employed.  His sister moved in from [place] and currently lives in [place].  He reports he helps take care of his sister who is blind. Social History   Socioeconomic History  .  Marital status: Single    Spouse name: None  . Number of children: 0  . Years of education: None  . Highest education level: High school graduate  Social Needs  . Financial resource strain: Somewhat hard  . Food insecurity - worry: Often true  . Food insecurity - inability: Often true  . Transportation needs - medical: Yes  . Transportation needs - non-medical: Yes  Occupational History    Comment: full time  Tobacco Use  . Smoking status: Current Every Day Smoker    Packs/day: 0.50    Years: 25.00    Pack years: 12.50    Types: Cigarettes  . Smokeless tobacco: Never Used  . Tobacco comment: Has discussed with Dr. Minerva AreolaSarah Ro  Substance and Sexual Activity  . Alcohol  use: No    Frequency: Never  . Drug use: Yes    Types: Marijuana    Comment: last used 2 weeks  . Sexual activity: No  Other Topics Concern  . None  Social History Narrative  . None    Allergies: No Known Allergies  Metabolic Disorder Labs: No results found for: HGBA1C, MPG No results found for: PROLACTIN No results found for: CHOL, TRIG, HDL, CHOLHDL, VLDL, LDLCALC No results found for: TSH  Therapeutic Level Labs: No results found for: LITHIUM No results found for: VALPROATE No components found for:  CBMZ  Current Medications: Current Outpatient Medications  Medication Sig Dispense Refill  . Aspirin (ASPIR-81 PO) Take 81 mg by mouth daily.    Marland Kitchen. atorvastatin (LIPITOR) 40 MG tablet Take 1 tablet by mouth daily.    Marland Kitchen. BIKTARVY 50-200-25 MG TABS tablet Take 1 tablet by mouth daily.    . citalopram (CELEXA) 10 MG tablet Take 1 tablet (10 mg total) by mouth daily with breakfast. 30 tablet 1  . ferrous sulfate 325 (65 FE) MG tablet Take 1 tablet (325 mg total) by mouth 2 (two) times daily with a meal. 60 tablet 0  . fluticasone (FLONASE) 50 MCG/ACT nasal spray Place 2 sprays into both nostrils as needed for allergies or rhinitis.    Marland Kitchen. lisinopril (PRINIVIL,ZESTRIL) 20 MG tablet Take 1 tablet by mouth daily.    . metFORMIN (GLUCOPHAGE) 500 MG tablet Take 1 tablet by mouth daily.    . pantoprazole (PROTONIX) 40 MG tablet Take 1 tablet (40 mg total) by mouth 2 (two) times daily. 60 tablet 0  . QUEtiapine (SEROQUEL) 25 MG tablet Take 1 tablet (25 mg total) by mouth at bedtime. FOR SLEEP AS WELL AS MOOD , PSYCHOSIS 30 tablet 1  . sucralfate (CARAFATE) 1 GM/10ML suspension Take 10 mLs (1 g total) by mouth 4 (four) times daily -  with meals and at bedtime. (Patient not taking: Reported on 07/09/2017) 420 mL 0   No current facility-administered medications for this visit.      Musculoskeletal: Strength & Muscle Tone: within normal limits Gait & Station: normal Patient leans:  N/A  Psychiatric Specialty Exam: Review of Systems  Psychiatric/Behavioral: Positive for depression (improved). The patient is nervous/anxious (improved).   All other systems reviewed and are negative.   Blood pressure 140/85, pulse 61, height 6' 4.5" (1.943 m), weight 242 lb 8 oz (110 kg).Body mass index is 29.13 kg/m.  General Appearance: Casual  Eye Contact:  Fair  Speech:  Clear and Coherent  Volume:  Normal  Mood:  Anxious  Affect:  Appropriate  Thought Process:  Goal Directed and Descriptions of Associations: Intact  Orientation:  Full (Time, Place,  and Person)  Thought Content: Logical , has a hx of AH, but denies today  Suicidal Thoughts:  No  Homicidal Thoughts:  No  Memory:  Immediate;   Fair Recent;   Fair Remote;   Fair  Judgement:  Fair  Insight:  Fair  Psychomotor Activity:  Normal  Concentration:  Concentration: Fair and Attention Span: Fair  Recall:  [employer] of Knowledge: Fair  Language: Fair  Akathisia:  No  Handed:  Right  AIMS (if indicated): No stiffness, rigidity.  Assets:  Communication Skills Desire for Improvement Housing Resilience Social Support Talents/Skills Transportation  ADL's:  Intact  Cognition: WNL  Sleep:  Fair   Screenings:   Assessment and Plan: Tanveer is a 57 year old African-American male, single, employed, lives in [place], has a history of depression, cannabis use disorder, tobacco use disorder, recent diagnosis of HIV, presented to the clinic today for a follow-up visit.  Derron today reports that he is tolerating the medications well and is doing better.  His sister moved in to [place] 2 weeks ago and he helps take care of his sister who is blind and has other medical problems.  He plans to return to work soon and is a bit anxious about that.  However overall he reports his mood symptoms as improved. Will continue medications and discussed scheduling psychotherapy appointments.  Plan as noted  below.  Plan MDD Continue Celexa 10 mg p.o. daily Continue Seroquel 25 mg p.o. nightly Referred for CBT with Ms. Peacock.  He reports he will make an appointment today.  For insomnia Continue Seroquel 25 mg p.o. nightly  For psychosis Patient currently denies any psychosis, Seroquel will help with the same. Unknown if his history of psychosis is due to depression versus cannabis use.  Monitor closely.  For cannabis use disorder Provided substance abuse counseling.  Tobacco use disorder Provided tobacco cessation counseling.  He is trying to quit.  He uses nicotine patch.  Referred for CBT with Ms. Peacock here in clinic.  Follow-up in clinic in 8 weeks or sooner if needed.  More than 50 % of the time was spent for psychoeducation and supportive psychotherapy and care coordination.  This note was generated in part or whole with voice recognition software. Voice recognition is usually quite accurate but there are transcription errors that can and very often do occur. I apologize for any typographical errors that were not detected and corrected.        Jomarie Longs, MD 07/11/2017, 9:01 AM

## 2017-07-14 ENCOUNTER — Other Ambulatory Visit: Payer: Self-pay

## 2017-07-14 NOTE — Telephone Encounter (Signed)
Work note has been faxed to [employer] at [phone] per pt request.

## 2017-07-15 NOTE — Discharge Instructions (Signed)
General Anesthesia, Adult, Care After °These instructions provide you with information about caring for yourself after your procedure. Your health care provider may also give you more specific instructions. Your treatment has been planned according to current medical practices, but problems sometimes occur. Call your health care provider if you have any problems or questions after your procedure. °What can I expect after the procedure? °After the procedure, it is common to have: °· Vomiting. °· A sore throat. °· Mental slowness. ° °It is common to feel: °· Nauseous. °· Cold or shivery. °· Sleepy. °· Tired. °· Sore or achy, even in parts of your body where you did not have surgery. ° °Follow these instructions at home: °For at least 24 hours after the procedure: °· Do not: °? Participate in activities where you could fall or become injured. °? Drive. °? Use heavy machinery. °? Drink alcohol. °? Take sleeping pills or medicines that cause drowsiness. °? Make important decisions or sign legal documents. °? Take care of children on your own. °· Rest. °Eating and drinking °· If you vomit, drink water, juice, or soup when you can drink without vomiting. °· Drink enough fluid to keep your urine clear or pale yellow. °· Make sure you have little or no nausea before eating solid foods. °· Follow the diet recommended by your health care provider. °General instructions °· Have a responsible adult stay with you until you are awake and alert. °· Return to your normal activities as told by your health care provider. Ask your health care provider what activities are safe for you. °· Take over-the-counter and prescription medicines only as told by your health care provider. °· If you smoke, do not smoke without supervision. °· Keep all follow-up visits as told by your health care provider. This is important. °Contact a health care provider if: °· You continue to have nausea or vomiting at home, and medicines are not helpful. °· You  cannot drink fluids or start eating again. °· You cannot urinate after 8-12 hours. °· You develop a skin rash. °· You have fever. °· You have increasing redness at the site of your procedure. °Get help right away if: °· You have difficulty breathing. °· You have chest pain. °· You have unexpected bleeding. °· You feel that you are having a life-threatening or urgent problem. °This information is not intended to replace advice given to you by your health care provider. Make sure you discuss any questions you have with your health care provider. °Document Released: 07/29/2000 Document Revised: 09/25/2015 Document Reviewed: 04/06/2015 °Elsevier Interactive Patient Education © 2018 Elsevier Inc. ° °

## 2017-07-17 ENCOUNTER — Encounter
Admission: RE | Disposition: A | Discharge status: Home / Self Care | Payer: Self-pay | Source: Ambulatory Visit | Attending: Gastroenterology

## 2017-07-17 ENCOUNTER — Telehealth: Payer: Self-pay | Admitting: Psychiatry

## 2017-07-17 ENCOUNTER — Ambulatory Visit
Admission: RE | Admit: 2017-07-17 | Discharge: 2017-07-17 | Disposition: A | Discharge status: Home / Self Care | Payer: BLUE CROSS/BLUE SHIELD | Source: Ambulatory Visit | Attending: Gastroenterology | Admitting: Gastroenterology

## 2017-07-17 ENCOUNTER — Ambulatory Visit: Payer: BLUE CROSS/BLUE SHIELD | Admitting: Anesthesiology

## 2017-07-17 DIAGNOSIS — K280 Acute gastrojejunal ulcer with hemorrhage: Secondary | ICD-10-CM

## 2017-07-17 DIAGNOSIS — J449 Chronic obstructive pulmonary disease, unspecified: Secondary | ICD-10-CM | POA: Insufficient documentation

## 2017-07-17 DIAGNOSIS — Z79899 Other long term (current) drug therapy: Secondary | ICD-10-CM | POA: Insufficient documentation

## 2017-07-17 DIAGNOSIS — I10 Essential (primary) hypertension: Secondary | ICD-10-CM | POA: Insufficient documentation

## 2017-07-17 DIAGNOSIS — Z7982 Long term (current) use of aspirin: Secondary | ICD-10-CM | POA: Insufficient documentation

## 2017-07-17 DIAGNOSIS — Z7951 Long term (current) use of inhaled steroids: Secondary | ICD-10-CM | POA: Insufficient documentation

## 2017-07-17 DIAGNOSIS — Z7984 Long term (current) use of oral hypoglycemic drugs: Secondary | ICD-10-CM | POA: Insufficient documentation

## 2017-07-17 DIAGNOSIS — E785 Hyperlipidemia, unspecified: Secondary | ICD-10-CM | POA: Insufficient documentation

## 2017-07-17 DIAGNOSIS — K3189 Other diseases of stomach and duodenum: Secondary | ICD-10-CM | POA: Insufficient documentation

## 2017-07-17 DIAGNOSIS — Z21 Asymptomatic human immunodeficiency virus [HIV] infection status: Secondary | ICD-10-CM | POA: Insufficient documentation

## 2017-07-17 DIAGNOSIS — E119 Type 2 diabetes mellitus without complications: Secondary | ICD-10-CM | POA: Insufficient documentation

## 2017-07-17 DIAGNOSIS — K621 Rectal polyp: Secondary | ICD-10-CM

## 2017-07-17 DIAGNOSIS — D649 Anemia, unspecified: Secondary | ICD-10-CM | POA: Insufficient documentation

## 2017-07-17 DIAGNOSIS — G4730 Sleep apnea, unspecified: Secondary | ICD-10-CM | POA: Insufficient documentation

## 2017-07-17 DIAGNOSIS — K2940 Chronic atrophic gastritis without bleeding: Secondary | ICD-10-CM | POA: Insufficient documentation

## 2017-07-17 DIAGNOSIS — Z1211 Encounter for screening for malignant neoplasm of colon: Secondary | ICD-10-CM | POA: Insufficient documentation

## 2017-07-17 DIAGNOSIS — Z860100 Personal history of colon polyps, unspecified: Secondary | ICD-10-CM

## 2017-07-17 DIAGNOSIS — K250 Acute gastric ulcer with hemorrhage: Secondary | ICD-10-CM

## 2017-07-17 DIAGNOSIS — F17210 Nicotine dependence, cigarettes, uncomplicated: Secondary | ICD-10-CM | POA: Insufficient documentation

## 2017-07-17 DIAGNOSIS — Z09 Encounter for follow-up examination after completed treatment for conditions other than malignant neoplasm: Secondary | ICD-10-CM | POA: Insufficient documentation

## 2017-07-17 DIAGNOSIS — Z8711 Personal history of peptic ulcer disease: Secondary | ICD-10-CM | POA: Insufficient documentation

## 2017-07-17 HISTORY — DX: Unspecified osteoarthritis, unspecified site: M19.90

## 2017-07-17 HISTORY — PX: COLONOSCOPY WITH PROPOFOL: SHX5780

## 2017-07-17 HISTORY — PX: ESOPHAGOGASTRODUODENOSCOPY (EGD) WITH PROPOFOL: SHX5813

## 2017-07-17 HISTORY — PX: POLYPECTOMY: SHX5525

## 2017-07-17 HISTORY — DX: Sleep apnea, unspecified: G47.30

## 2017-07-17 LAB — GLUCOSE, CAPILLARY
Glucose-Capillary: 102 mg/dL — ABNORMAL HIGH (ref 65–99)
Glucose-Capillary: 101 mg/dL — ABNORMAL HIGH (ref 65–99)

## 2017-07-17 SURGERY — COLONOSCOPY WITH PROPOFOL
Anesthesia: General | Site: Throat | Wound class: Contaminated

## 2017-07-17 MED ORDER — SODIUM CHLORIDE 0.9 % IV SOLN: INTRAVENOUS | Status: DC

## 2017-07-17 MED ORDER — ONDANSETRON HCL 4 MG/2ML IJ SOLN: 4.0000 mg | Freq: Once | INTRAMUSCULAR | Status: DC | PRN

## 2017-07-17 MED ORDER — SIMETHICONE 40 MG/0.6ML PO SUSP
ORAL | Status: DC | PRN
  Administered 2017-07-17: .5 mL

## 2017-07-17 MED ORDER — GLYCOPYRROLATE 0.2 MG/ML IJ SOLN
INTRAMUSCULAR | Status: DC | PRN
  Administered 2017-07-17: 0.1 mg via INTRAVENOUS

## 2017-07-17 MED ORDER — LACTATED RINGERS IV SOLN
INTRAVENOUS | Status: DC
  Administered 2017-07-17: 07:00:00 via INTRAVENOUS

## 2017-07-17 MED ORDER — PROPOFOL 10 MG/ML IV BOLUS
INTRAVENOUS | Status: DC | PRN
  Administered 2017-07-17: 40 mg via INTRAVENOUS
  Administered 2017-07-17: 20 mg via INTRAVENOUS
  Administered 2017-07-17 (×3): 30 mg via INTRAVENOUS
  Administered 2017-07-17: 150 mg via INTRAVENOUS
  Administered 2017-07-17 (×2): 20 mg via INTRAVENOUS
  Administered 2017-07-17: 30 mg via INTRAVENOUS
  Administered 2017-07-17: 20 mg via INTRAVENOUS
  Administered 2017-07-17 (×3): 30 mg via INTRAVENOUS

## 2017-07-17 MED ORDER — ACETAMINOPHEN 160 MG/5ML PO SOLN: 325.0000 mg | ORAL | Status: DC | PRN

## 2017-07-17 MED ORDER — LIDOCAINE HCL (CARDIAC) 20 MG/ML IV SOLN
INTRAVENOUS | Status: DC | PRN
  Administered 2017-07-17: 30 mg via INTRAVENOUS

## 2017-07-17 MED ORDER — ACETAMINOPHEN 325 MG PO TABS: 650.0000 mg | ORAL_TABLET | Freq: Once | ORAL | Status: DC | PRN

## 2017-07-17 SURGICAL SUPPLY — 12 items
BLOCK BITE 60FR ADLT L/F GRN (MISCELLANEOUS) ×5 IMPLANT
CANISTER SUCT 1200ML W/VALVE (MISCELLANEOUS) ×5 IMPLANT
CLIP HMST 235XBRD CATH ROT (MISCELLANEOUS) IMPLANT
CLIP RESOLUTION 360 11X235 (MISCELLANEOUS)
ELECT REM PT RETURN 9FT ADLT (ELECTROSURGICAL)
ELECTRODE REM PT RTRN 9FT ADLT (ELECTROSURGICAL) IMPLANT
FORCEPS BIOP RAD 4 LRG CAP 4 (CUTTING FORCEPS) ×5 IMPLANT
GOWN CVR UNV OPN BCK APRN NK (MISCELLANEOUS) ×6 IMPLANT
GOWN ISOL THUMB LOOP REG UNIV (MISCELLANEOUS) ×4
KIT ENDO PROCEDURE OLY (KITS) ×5 IMPLANT
TRAP ETRAP POLY (MISCELLANEOUS) IMPLANT
WATER STERILE IRR 250ML POUR (IV SOLUTION) ×5 IMPLANT

## 2017-07-17 NOTE — Op Note (Signed)
[facility] Gastroenterology Patient Name: Patrick Galloway Procedure Date: 07/17/2017 7:27 AM MRN: [id number] Account #: [id number] Date of Birth: 20-Jun-1960 Admit Type: Outpatient Age: 57 [facility] Gender: Male Note Status: Finalized Procedure:            Colonoscopy Indications:          High risk colon cancer surveillance: Personal history                        of colonic polyps Providers:            Midge Miniumarren Micaylah Bertucci MD, MD Medicines:            Propofol per Anesthesia Complications:        No immediate complications. Procedure:            Pre-Anesthesia Assessment:                       - Prior to the procedure, a History and Physical was                        performed, and patient medications and allergies were                        reviewed. The patient's tolerance of previous                        anesthesia was also reviewed. The risks and benefits of                        the procedure and the sedation options and risks were                        discussed with the patient. All questions were                        answered, and informed consent was obtained. Prior                        Anticoagulants: The patient has taken no previous                        anticoagulant or antiplatelet agents. ASA Grade                        Assessment: II - A patient with mild systemic disease.                        After reviewing the risks and benefits, the patient was                        deemed in satisfactory condition to undergo the                        procedure.                       After obtaining informed consent, the colonoscope was                        passed under direct vision. Throughout the  procedure,                        the patient's blood pressure, pulse, and oxygen                        saturations were monitored continuously. The Olympus CF                        H180AL Colonoscope (S#: [id number]) was introduced through                      the anus and advanced to the the cecum, identified by                        appendiceal orifice and ileocecal valve. The                        colonoscopy was performed without difficulty. The                        patient tolerated the procedure well. The quality of                        the bowel preparation was excellent. Findings:      The perianal and digital rectal examinations were normal.      Two sessile polyps were found in the rectum. The polyps were 3 to 4 mm       in size. These polyps were removed with a cold biopsy forceps. Resection       and retrieval were complete. Impression:           - Two 3 to 4 mm polyps in the rectum, removed with a                        cold biopsy forceps. Resected and retrieved. Recommendation:       - Discharge patient to home.                       - Resume previous diet.                       - Continue present medications.                       - Await pathology results.                       - Repeat colonoscopy in 5 years for surveillance. Procedure Code(s):    --- Professional ---                       [phone], Colonoscopy, flexible; with biopsy, single or                        multiple Diagnosis Code(s):    --- Professional ---                       Z86.010, Personal history of colonic polyps                       K62.1, Rectal polyp CPT copyright 2016 American  Medical Association. All rights reserved. The codes documented in this report are preliminary and upon coder review may  be revised to meet current compliance requirements. [name] MD, MD 07/17/2017 8:15:56 AM This report has been signed electronically. Number of Addenda: 0 Note Initiated On: 07/17/2017 7:27 AM Scope Withdrawal Time: 0 hours 6 minutes 18 seconds  Total Procedure Duration: 0 hours 10 minutes 6 seconds       [facility]

## 2017-07-17 NOTE — H&P (Signed)
Patrick Miniumarren Kaydince Towles, MD [facility] [address] [place], [place] [address] Phone:[phone] Fax : [phone]  Primary Care Physician:  Patrick Garfinkelo, Patrick J, MD Primary Gastroenterologist:  Dr. Servando Galloway  Pre-Procedure History & Physical: HPI:  Patrick RobinsonCalvert W Galloway is a 57 y.o. male is here for an endoscopy and colonoscopy.   Past Medical History:  Diagnosis Date  . Arthritis    hips and knees  . DM2 (diabetes mellitus, type 2) (HCC)   . HIV infection (HCC)   . Hyperlipemia   . Hypertension   . Immune deficiency disorder (HCC)    PT reports HIV  . Seizures (HCC)   . Sleep apnea     Past Surgical History:  Procedure Laterality Date  . ESOPHAGOGASTRODUODENOSCOPY (EGD) WITH PROPOFOL N/A 05/24/2017   Procedure: ESOPHAGOGASTRODUODENOSCOPY (EGD) WITH PROPOFOL;  Surgeon: Patrick MiniumWohl, Patrick Hargens, MD;  [facility];  Service: Endoscopy;  Laterality: N/A;  . none      Prior to Admission medications   Medication Sig Start Date End Date Taking? Authorizing Provider  atorvastatin (LIPITOR) 40 MG tablet Take 1 tablet by mouth daily. 05/08/17  Yes [provider]  citalopram (CELEXA) 10 MG tablet Take 1 tablet (10 mg total) by mouth daily with breakfast. 07/11/17  Yes Patrick Galloway, Patrick BaconSaramma, MD  ferrous sulfate 325 (65 FE) MG tablet Take 1 tablet (325 mg total) by mouth 2 (two) times daily with a meal. 05/26/17  Yes Patrick Galloway, Snehalatha, MD  fluticasone (FLONASE) 50 MCG/ACT nasal spray Place 2 sprays into both nostrils as needed for allergies or rhinitis.   Yes [provider]  lisinopril (PRINIVIL,ZESTRIL) 20 MG tablet Take 1 tablet by mouth daily. 05/08/17  Yes [provider]  metFORMIN (GLUCOPHAGE) 500 MG tablet Take 1 tablet by mouth daily. 05/08/17  Yes [provider]  pantoprazole (PROTONIX) 40 MG tablet Take 1 tablet (40 mg total) by mouth 2 (two) times daily. 05/26/17  Yes Patrick Galloway, Snehalatha, MD  QUEtiapine (SEROQUEL) 25 MG tablet Take 1 tablet (25 mg total) by mouth at  bedtime. FOR SLEEP AS WELL AS MOOD , PSYCHOSIS 07/11/17  Yes Patrick Galloway, Saramma, MD  Aspirin (ASPIR-81 PO) Take 81 mg by mouth daily. 11/03/12   [provider]  BIKTARVY 50-200-25 MG TABS tablet Take 1 tablet by mouth daily. 05/20/17   [provider]  sucralfate (CARAFATE) 1 GM/10ML suspension Take 10 mLs (1 g total) by mouth 4 (four) times daily -  with meals and at bedtime. Patient not taking: Reported on 07/09/2017 05/26/17   Patrick Galloway, Snehalatha, MD    Allergies as of 07/09/2017  . (No Known Allergies)    Family History  Problem Relation Age of Onset  . Heart failure Mother   . Heart failure Father     Social History   Socioeconomic History  . Marital status: Single    Spouse name: Not on file  . Number of children: 0  . Years of education: Not on file  . Highest education level: High school graduate  Social Needs  . Financial resource strain: Somewhat hard  . Food insecurity - worry: Often true  . Food insecurity - inability: Often true  . Transportation needs - medical: Yes  . Transportation needs - non-medical: Yes  Occupational History    Comment: full time  Tobacco Use  . Smoking status: Current Every Day Smoker    Packs/day: 0.50    Years: 25.00    Pack years: 12.50    Types: Cigarettes  . Smokeless tobacco: Never  Used  . Tobacco comment: Has discussed with Dr. Minerva Galloway  Substance and Sexual Activity  . Alcohol use: No    Frequency: Never  . Drug use: Yes    Types: Marijuana    Comment: last used 2 weeks  . Sexual activity: No  Other Topics Concern  . Not on file  Social History Narrative  . Not on file    Review of Systems: See HPI, otherwise negative ROS  Physical Exam: BP 133/82   Pulse 64   Temp 98.6 F (37 C) (Temporal)   Resp 17   Ht 6\' 4"  (1.93 m)   Wt 235 lb (106.6 kg)   SpO2 99%   BMI 28.61 kg/m  General:   Alert,  pleasant and cooperative in NAD Head:  Normocephalic and atraumatic. Neck:  Supple; no masses or  thyromegaly. Lungs:  Clear throughout to auscultation.    Heart:  Regular rate and rhythm. Abdomen:  Soft, nontender and nondistended. Normal bowel sounds, without guarding, and without rebound.   Neurologic:  Alert and  oriented x4;  grossly normal neurologically.  Impression/Plan: Patrick Galloway is here for an endoscopy and colonoscopy to be performed for history of gastric ulcer and polyps  Risks, benefits, limitations, and alternatives regarding  endoscopy and colonoscopy have been reviewed with the patient.  Questions have been answered.  All parties agreeable.   Patrick Minium, MD  07/17/2017, 7:42 AM

## 2017-07-17 NOTE — Anesthesia Preprocedure Evaluation (Signed)
Anesthesia Evaluation  Patient identified by MRN, date of birth, ID band Patient awake    Reviewed: Allergy & Precautions, NPO status , Patient's Chart, lab work & pertinent test results  History of Anesthesia Complications Negative for: history of anesthetic complications  Airway Mallampati: I  TM Distance: >3 FB Neck ROM: Full    Dental   Missing all upper and most lower teeth; none loose or chipped per pt:   Pulmonary COPD, Current Smoker (smokes 1/2 ppd),  Snoring    Pulmonary exam normal breath sounds clear to auscultation       Cardiovascular Exercise Tolerance: Good hypertension, Normal cardiovascular exam Rhythm:Regular Rate:Normal     Neuro/Psych Seizures - (as a child; not on anti-epileptics),     GI/Hepatic PUD,   Endo/Other  diabetes  Renal/GU negative Renal ROS     Musculoskeletal   Abdominal   Peds  Hematology  (+) Blood dyscrasia, anemia , HIV,   Anesthesia Other Findings   Reproductive/Obstetrics                             Anesthesia Physical Anesthesia Plan  ASA: III  Anesthesia Plan: General   Post-op Pain Management:    Induction: Intravenous  PONV Risk Score and Plan: 1 and TIVA and Propofol infusion  Airway Management Planned: Natural Airway  Additional Equipment:   Intra-op Plan:   Post-operative Plan:   Informed Consent: I have reviewed the patients History and Physical, chart, labs and discussed the procedure including the risks, benefits and alternatives for the proposed anesthesia with the patient or authorized representative who has indicated his/her understanding and acceptance.     Plan Discussed with: CRNA  Anesthesia Plan Comments:         Anesthesia Quick Evaluation

## 2017-07-17 NOTE — Transfer of Care (Signed)
Immediate Anesthesia Transfer of Care Note  Patient: Patrick RobinsonCalvert W Galloway  Procedure(s) Performed: COLONOSCOPY WITH PROPOFOL (N/A Rectum) ESOPHAGOGASTRODUODENOSCOPY (EGD) WITH PROPOFOL (N/A Throat) POLYPECTOMY (Rectum)  Patient Location: PACU  Anesthesia Type: General  Level of Consciousness: awake, alert  and patient cooperative  Airway and Oxygen Therapy: Patient Spontanous Breathing and Patient connected to supplemental oxygen  Post-op Assessment: Post-op Vital signs reviewed, Patient's Cardiovascular Status Stable, Respiratory Function Stable, Patent Airway and No signs of Nausea or vomiting  Post-op Vital Signs: Reviewed and stable  Complications: No apparent anesthesia complications

## 2017-07-17 NOTE — Anesthesia Postprocedure Evaluation (Signed)
Anesthesia Post Note  Patient: Patrick Galloway  Procedure(s) Performed: COLONOSCOPY WITH PROPOFOL (N/A Rectum) ESOPHAGOGASTRODUODENOSCOPY (EGD) WITH PROPOFOL (N/A Throat) POLYPECTOMY (Rectum)  Patient location during evaluation: PACU Anesthesia Type: General Level of consciousness: awake and alert, oriented and patient cooperative Pain management: pain level controlled Vital Signs Assessment: post-procedure vital signs reviewed and stable Respiratory status: spontaneous breathing, nonlabored ventilation and respiratory function stable Cardiovascular status: blood pressure returned to baseline and stable Postop Assessment: adequate PO intake Anesthetic complications: no    Reed BreechAndrea Johann Santone

## 2017-07-17 NOTE — Op Note (Signed)
[facility] Gastroenterology Patient Name: Patrick Galloway Ahn Procedure Date: 07/17/2017 7:28 AM MRN: [id number] Account #: [id number] Date of Birth: Apr 24, 1961 Admit Type: Outpatient Age: [age] [facility] Gender: Male Note Status: Finalized Procedure:            Upper GI endoscopy Indications:          Recent gastrointestinal bleeding, Surveillance                        procedure, Follow-up of acute gastrojejunal ulcer with                        hemorrhage Providers:            Midge Miniumarren Teonna Coonan MD, MD Medicines:            Propofol per Anesthesia Complications:        No immediate complications. Procedure:            Pre-Anesthesia Assessment:                       - Prior to the procedure, a History and Physical was                        performed, and patient medications and allergies were                        reviewed. The patient's tolerance of previous                        anesthesia was also reviewed. The risks and benefits of                        the procedure and the sedation options and risks were                        discussed with the patient. All questions were                        answered, and informed consent was obtained. Prior                        Anticoagulants: The patient has taken no previous                        anticoagulant or antiplatelet agents. ASA Grade                        Assessment: II - A patient with mild systemic disease.                        After reviewing the risks and benefits, the patient was                        deemed in satisfactory condition to undergo the                        procedure.                       After obtaining informed consent, the endoscope was  passed under direct vision. Throughout the procedure,                        the patient's blood pressure, pulse, and oxygen                        saturations were monitored continuously. The Olympus         GIF H180J Endoscope (S#: [id number]) was introduced                        through the mouth, and advanced to the second part of                        duodenum. The upper GI endoscopy was accomplished                        without difficulty. The patient tolerated the procedure                        well. Findings:      The examined esophagus was normal.      A scar was found in the gastric antrum.      A single lesion was found in the gastric antrum. Biopsies were taken       with a cold forceps for histology.      The examined duodenum was normal. Impression:           - Normal esophagus.                       - Scar in the gastric antrum.                       - A single lesion was found in the stomach. Biopsied.                       - Normal examined duodenum. Recommendation:       - Discharge patient to home.                       - Resume previous diet.                       - Continue present medications.                       - Await pathology results.                       - Perform a colonoscopy today. Procedure Code(s):    --- Professional ---                       [phone], Esophagogastroduodenoscopy, flexible, transoral;                        with biopsy, single or multiple Diagnosis Code(s):    --- Professional ---                       K28.0, Acute gastrojejunal ulcer with hemorrhage                       K92.2, Gastrointestinal  hemorrhage, unspecified CPT copyright 2016 American Medical Association. All rights reserved. The codes documented in this report are preliminary and upon coder review may  be revised to meet current compliance requirements. [name] MD, MD 07/17/2017 8:03:10 AM This report has been signed electronically. Number of Addenda: 0 Note Initiated On: 07/17/2017 7:28 AM      [facility]

## 2017-07-17 NOTE — Anesthesia Procedure Notes (Signed)
Date/Time: 07/17/2017 7:50 AM Performed by: Maree KrabbeWarren, Caoimhe Damron, CRNA Pre-anesthesia Checklist: Patient identified, Emergency Drugs available, Suction available, Timeout performed and Patient being monitored Patient Re-evaluated:Patient Re-evaluated prior to induction Oxygen Delivery Method: Nasal cannula Placement Confirmation: positive ETCO2

## 2017-07-17 NOTE — Telephone Encounter (Signed)
Patrick Galloway, pls ask him to come in for an appointment .

## 2017-07-21 ENCOUNTER — Encounter: Payer: Self-pay | Admitting: Gastroenterology

## 2017-07-21 ENCOUNTER — Ambulatory Visit (INDEPENDENT_AMBULATORY_CARE_PROVIDER_SITE_OTHER): Payer: Self-pay | Admitting: Psychiatry

## 2017-07-21 ENCOUNTER — Encounter: Payer: Self-pay | Admitting: Psychiatry

## 2017-07-21 ENCOUNTER — Other Ambulatory Visit: Payer: Self-pay

## 2017-07-21 VITALS — BP 135/84 | HR 65 | Temp 98.6°F | Wt 238.2 lb

## 2017-07-21 DIAGNOSIS — F17200 Nicotine dependence, unspecified, uncomplicated: Secondary | ICD-10-CM

## 2017-07-21 DIAGNOSIS — F331 Major depressive disorder, recurrent, moderate: Secondary | ICD-10-CM | POA: Insufficient documentation

## 2017-07-21 MED ORDER — CITALOPRAM HYDROBROMIDE 10 MG PO TABS: 10.0000 mg | ORAL_TABLET | Freq: Every day | ORAL | 1 refills | Status: DC

## 2017-07-21 MED ORDER — QUETIAPINE FUMARATE 25 MG PO TABS: 25.0000 mg | ORAL_TABLET | Freq: Every day | ORAL | 1 refills | Status: DC

## 2017-07-21 MED ORDER — HYDROXYZINE PAMOATE 25 MG PO CAPS: 25.0000 mg | ORAL_CAPSULE | Freq: Every day | ORAL | 1 refills | Status: DC | PRN
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
## 2017-07-23 ENCOUNTER — Ambulatory Visit: Admit: 2017-07-23 | Discharge: 2017-07-24 | Payer: MEDICAID | Attending: Family Medicine | Primary: Family Medicine

## 2017-07-23 DIAGNOSIS — D649 Anemia, unspecified: Secondary | ICD-10-CM | POA: Insufficient documentation

## 2017-07-23 DIAGNOSIS — F339 Major depressive disorder, recurrent, unspecified: Secondary | ICD-10-CM

## 2017-07-25 ENCOUNTER — Ambulatory Visit: Payer: BLUE CROSS/BLUE SHIELD | Admitting: Licensed Clinical Social Worker

## 2017-08-04 ENCOUNTER — Emergency Department: Payer: Self-pay

## 2017-08-04 ENCOUNTER — Other Ambulatory Visit: Payer: Self-pay

## 2017-08-04 ENCOUNTER — Emergency Department
Admission: EM | Admit: 2017-08-04 | Discharge: 2017-08-04 | Disposition: A | Discharge status: Home / Self Care | Payer: Self-pay | Attending: Emergency Medicine | Admitting: Emergency Medicine

## 2017-08-04 ENCOUNTER — Encounter: Payer: Self-pay | Admitting: Emergency Medicine

## 2017-08-04 DIAGNOSIS — Z7982 Long term (current) use of aspirin: Secondary | ICD-10-CM | POA: Insufficient documentation

## 2017-08-04 DIAGNOSIS — I10 Essential (primary) hypertension: Secondary | ICD-10-CM | POA: Insufficient documentation

## 2017-08-04 DIAGNOSIS — F17210 Nicotine dependence, cigarettes, uncomplicated: Secondary | ICD-10-CM | POA: Insufficient documentation

## 2017-08-04 DIAGNOSIS — Z79899 Other long term (current) drug therapy: Secondary | ICD-10-CM | POA: Insufficient documentation

## 2017-08-04 DIAGNOSIS — Z7984 Long term (current) use of oral hypoglycemic drugs: Secondary | ICD-10-CM | POA: Insufficient documentation

## 2017-08-04 DIAGNOSIS — J209 Acute bronchitis, unspecified: Secondary | ICD-10-CM | POA: Insufficient documentation

## 2017-08-04 DIAGNOSIS — E119 Type 2 diabetes mellitus without complications: Secondary | ICD-10-CM | POA: Insufficient documentation

## 2017-08-04 DIAGNOSIS — Z21 Asymptomatic human immunodeficiency virus [HIV] infection status: Secondary | ICD-10-CM | POA: Insufficient documentation

## 2017-08-04 LAB — BASIC METABOLIC PANEL
Anion gap: 6 (ref 5–15)
BUN: 15 mg/dL (ref 6–20)
CO2: 28 mmol/L (ref 22–32)
CREATININE: 0.94 mg/dL (ref 0.61–1.24)
Calcium: 9 mg/dL (ref 8.9–10.3)
Chloride: 107 mmol/L (ref 101–111)
GFR calc Af Amer: >60 mL/min (ref >60)
Glucose, Bld: 106 mg/dL — ABNORMAL HIGH (ref 65–99)
Potassium: 4.1 mmol/L (ref 3.5–5.1)
SODIUM: 141 mmol/L (ref 135–145)

## 2017-08-04 LAB — INFLUENZA PANEL BY PCR (TYPE A & B)
INFLBPCR: NEGATIVE
Influenza A By PCR: NEGATIVE

## 2017-08-04 LAB — CBC
HCT: 45.2 % (ref 40.0–52.0)
Hemoglobin: 14.6 g/dL (ref 13.0–18.0)
MCH: 29.3 pg (ref 26.0–34.0)
MCHC: 32.3 g/dL (ref 32.0–36.0)
MCV: 90.7 fL (ref 80.0–100.0)
PLATELETS: 166 10*3/uL (ref 150–440)
RBC: 4.98 MIL/uL (ref 4.40–5.90)
RDW: 16.4 % — AB (ref 11.5–14.5)
WBC: 5.4 10*3/uL (ref 3.8–10.6)

## 2017-08-04 LAB — TROPONIN I: Troponin I: <0.03 ng/mL (ref <0.03)

## 2017-08-04 MED ORDER — AZITHROMYCIN 500 MG PO TABS
500.0000 mg | ORAL_TABLET | Freq: Once | ORAL | Status: AC
  Administered 2017-08-04: 500 mg via ORAL
  Filled 2017-08-04: qty 1

## 2017-08-04 MED ORDER — AZITHROMYCIN 250 MG PO TABS: 250.0000 mg | ORAL_TABLET | Freq: Every day | ORAL | 0 refills | Status: AC

## 2017-08-04 MED ORDER — GUAIFENESIN 200 MG PO TABS: 400.0000 mg | ORAL_TABLET | Freq: Four times a day (QID) | ORAL | 0 refills | Status: DC | PRN

## 2017-08-04 MED ORDER — ALBUTEROL SULFATE (2.5 MG/3ML) 0.083% IN NEBU
2.5000 mg | INHALATION_SOLUTION | Freq: Once | RESPIRATORY_TRACT | Status: AC
  Administered 2017-08-04: 2.5 mg via RESPIRATORY_TRACT
  Filled 2017-08-04: qty 3

## 2017-08-04 MED ORDER — ALBUTEROL SULFATE HFA 108 (90 BASE) MCG/ACT IN AERS: 2.0000 | INHALATION_SPRAY | Freq: Four times a day (QID) | RESPIRATORY_TRACT | 2 refills | Status: AC | PRN

## 2017-08-04 NOTE — ED Triage Notes (Signed)
C/o central chest pain that he feels is separate from cough.  Has had cough for about 1 week. HIV +.  Feels like when has had PNA in past.  No fevers at home. None here. Unlabored. VSS.

## 2017-08-04 NOTE — Discharge Instructions (Addendum)
Return to the ER for new, worsening, or persistent severe chest pain, difficulty breathing, weakness or lightheadedness, or any other new or worsening symptoms that concern you.  Take the antibiotic starting tomorrow and finish the full course.  You may use the inhaler every 4-6 hours as needed for chest tightness.

## 2017-08-04 NOTE — ED Notes (Signed)
[place]  AAOx3.  Skin warm and dry. NAD

## 2017-08-04 NOTE — ED Triage Notes (Signed)
First Nurse Note:  C/O productive cough x 3 days.

## 2017-08-04 NOTE — ED Provider Notes (Signed)
[facility] Emergency Department Provider Note ____________________________________________   First MD Initiated Contact with Patient 08/04/17 1626     (approximate)  I have reviewed the triage vital signs and the nursing notes.   HISTORY  Chief Complaint Chest Pain    HPI Patrick Galloway is a 57 y.o. male with past medical history as noted below including HIV) patient does not know his CD4 count) who presents with shortness of breath over the last 2-3 days, gradual onset, associated with cough productive of greenish sputum, as well as chest tightness and discomfort but no frank chest pain.  Patient also reports nasal congestion and rhinorrhea.  He denies leg pain or swelling, vomiting, diarrhea, or fever.  Past Medical History:  Diagnosis Date  . Arthritis    hips and knees  . DM2 (diabetes mellitus, type 2) (HCC)   . HIV infection (HCC)   . Hyperlipemia   . Hypertension   . Immune deficiency disorder (HCC)    PT reports HIV  . Seizures (HCC)   . Sleep apnea     Patient Active Problem List   Diagnosis Date Noted  . MDD (major depressive disorder), recurrent episode, moderate (HCC) 07/21/2017  . Acute gastrointestinal ulcer with hemorrhage   . History of colonic polyps   . Rectal polyp   . HIV (human immunodeficiency virus infection) (HCC) 07/11/2017  . Acute gastric ulcer with hemorrhage   . Melena   . Upper GI bleed 05/23/2017  . HIV test positive (HCC) 04/30/2017  . Mixed hyperlipidemia 01/24/2016  . Hypertension associated with diabetes (HCC) 01/24/2016  . Diabetes mellitus type 2 in obese (HCC) 01/24/2016  . Other chest pain 04/18/2015  . URI, acute 10/07/2014  . Perirectal cyst 09/30/2014  . Bone spur 09/30/2014  . Hyperthyroidism 09/07/2014  . Tobacco dependence 07/08/2014  . 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 Pterygium 04/12/2011  . Obesity 10/19/2010  . Primary localized osteoarthrosis, lower leg 09/26/2010    Past Surgical  History:  Procedure Laterality Date  . COLONOSCOPY WITH PROPOFOL N/A 07/17/2017   Procedure: COLONOSCOPY WITH PROPOFOL;  Surgeon: [name], MD;  Location: [facility];  Service: Endoscopy;  Laterality: N/A;  . ESOPHAGOGASTRODUODENOSCOPY (EGD) WITH PROPOFOL N/A 05/24/2017   Procedure: ESOPHAGOGASTRODUODENOSCOPY (EGD) WITH PROPOFOL;  Surgeon: [name], MD;  Location: [facility] ENDOSCOPY;  Service: Endoscopy;  Laterality: N/A;  . ESOPHAGOGASTRODUODENOSCOPY (EGD) WITH PROPOFOL N/A 07/17/2017   Procedure: ESOPHAGOGASTRODUODENOSCOPY (EGD) WITH PROPOFOL;  Surgeon: [name], MD;  Location: [facility];  Service: Endoscopy;  Laterality: N/A;  . none    . POLYPECTOMY  07/17/2017   Procedure: POLYPECTOMY;  Surgeon: [name], MD;  Location: [facility];  Service: Endoscopy;;    Prior to Admission medications   Medication Sig Start Date End Date Taking? Authorizing Provider  albuterol (PROVENTIL HFA;VENTOLIN HFA) 108 (90 Base) MCG/ACT inhaler Inhale 2 puffs into the lungs every 6 (six) hours as needed for wheezing or shortness of breath. 08/04/17   Dionne Bucy, MD  Aspirin (ASPIR-81 PO) Take 81 mg by mouth daily. 11/03/12   [provider]  atorvastatin (LIPITOR) 40 MG tablet Take 1 tablet by mouth daily. 05/08/17   [provider]  azithromycin (ZITHROMAX) 250 MG tablet Take 1 tablet (250 mg total) by mouth daily for 4 days. 08/05/17 08/09/17  Dionne Bucy, MD  BIKTARVY 50-200-25 MG TABS tablet Take 1 tablet by mouth daily. 05/20/17   [provider]  citalopram (CELEXA) 10 MG tablet Take  1 tablet (10 mg total) by mouth daily with breakfast. 07/21/17   Jomarie Longs, MD  ferrous sulfate 325 (65 FE) MG tablet Take 1 tablet (325 mg total) by mouth 2 (two) times daily with a meal. 05/26/17   Katha Hamming, MD  fluticasone (FLONASE) 50 MCG/ACT nasal spray Place 2 sprays into both nostrils as needed for allergies or rhinitis.    [provider]  guaiFENesin 200 MG tablet Take 2 tablets (400 mg total) by mouth every 6 (six) hours as needed for cough or to loosen phlegm. 08/04/17   Dionne Bucy, MD  hydrOXYzine (VISTARIL) 25 MG capsule Take 1 capsule (25 mg total) by mouth daily as needed (only for severe anxiety and agitation). 07/21/17   Jomarie Longs, MD  lisinopril (PRINIVIL,ZESTRIL) 20 MG tablet Take 1 tablet by mouth daily. 05/08/17   [provider]  metFORMIN (GLUCOPHAGE) 500 MG tablet Take 1 tablet by mouth daily. 05/08/17   [provider]  pantoprazole (PROTONIX) 40 MG tablet Take 1 tablet (40 mg total) by mouth 2 (two) times daily. 05/26/17   Katha Hamming, MD  QUEtiapine (SEROQUEL) 25 MG tablet Take 1 tablet (25 mg total) by mouth at bedtime. FOR SLEEP AS WELL AS MOOD , PSYCHOSIS 07/21/17   Jomarie Longs, MD  sucralfate (CARAFATE) 1 GM/10ML suspension Take 10 mLs (1 g total) by mouth 4 (four) times daily -  with meals and at bedtime. 05/26/17   Katha Hamming, MD    Allergies Patient has no known allergies.  Family History  Problem Relation Age of Onset  . Heart failure Mother   . Heart failure Father     Social History Social History   Tobacco Use  . Smoking status: Current Every Day Smoker    Packs/day: 0.50    Years: 25.00    Pack years: 12.50    Types: Cigarettes  . Smokeless tobacco: Never Used  . Tobacco comment: Has discussed with Dr. Minerva Areola  Substance Use Topics  . Alcohol use: No    Frequency: Never  . Drug use: Yes    Types: Marijuana    Comment: last used 2 weeks    Review of Systems  Constitutional: No fever. Eyes: No redness. ENT: Positive for nasal congestion. Cardiovascular: Positive for chest discomfort. Respiratory: Positive for shortness of breath. Gastrointestinal: No vomiting.  No diarrhea.  Genitourinary: Negative for dysuria.  Musculoskeletal: Negative for back pain. Skin: Negative for rash. Neurological: Negative for  headache.   ____________________________________________   PHYSICAL EXAM:  VITAL SIGNS: ED Triage Vitals  Enc Vitals Group     BP 08/04/17 1159 118/63     Pulse Rate 08/04/17 1159 62     Resp 08/04/17 1159 20     Temp 08/04/17 1159 99 F (37.2 C)     Temp Source 08/04/17 1159 Oral     SpO2 08/04/17 1159 95 %     Weight 08/04/17 1155 235 lb (106.6 kg)     Height 08/04/17 1155 6\' 4"  (1.93 m)     Head Circumference --      Peak Flow --      Pain Score 08/04/17 1155 6     Pain Loc --      Pain Edu? --      Excl. in GC? --     Constitutional: Alert and oriented.  Relatively well appearing and in no acute distress. Eyes: Conjunctivae are normal.  Head: Atraumatic. Nose: No congestion/rhinnorhea. Mouth/Throat: Mucous membranes are moist.  Neck: Normal range of motion.  Cardiovascular: Normal rate, regular rhythm. Grossly normal heart sounds.  Good peripheral circulation. Respiratory: Normal respiratory effort.  No retractions. slightly prolonged expiratory phase but lungs otherwise CTAB. Gastrointestinal: No distention.  Musculoskeletal: Extremities warm and well perfused.  Neurologic:  Normal speech and language. No gross focal neurologic deficits are appreciated.  Skin:  Skin is warm and dry. No rash noted. Psychiatric: Mood and affect are normal. Speech and behavior are normal.  ____________________________________________   LABS (all labs ordered are listed, but only abnormal results are displayed)  Labs Reviewed  BASIC METABOLIC PANEL - Abnormal; Notable for the following components:      Result Value   Glucose, Bld 106 (*)    All other components within normal limits  CBC - Abnormal; Notable for the following components:   RDW 16.4 (*)    All other components within normal limits  TROPONIN I  INFLUENZA PANEL BY PCR (TYPE A & B)   ____________________________________________  EKG  ED ECG REPORT I, Dionne Bucy, the attending physician, personally  viewed and interpreted this ECG.  Date: 08/04/2017 EKG Time: 1158 Rate: 58 Rhythm: normal sinus rhythm QRS Axis: normal Intervals: Short PR, LAFB, incomplete RBBB ST/T Wave abnormalities: normal Narrative Interpretation: no evidence of acute ischemia  ____________________________________________  RADIOLOGY  CXR: No focal infiltrate  ____________________________________________   PROCEDURES  Procedure(s) performed: No  Procedures  Critical Care performed: No ____________________________________________   INITIAL IMPRESSION / ASSESSMENT AND PLAN / ED COURSE  Pertinent labs & imaging results that were available during my care of the patient were reviewed by me and considered in my medical decision making (see chart for details).  57 year old male with history of HIV presents with shortness of breath, productive cough, and chest discomfort over the last 2-3 days associated with URI symptoms.  I reviewed the past medical records in Epic; patient was admitted earlier this year for GI bleed, but has had no recent similar presentations.  No recent CD4 counts available.  On exam, the patient is relatively well-appearing, vital signs are normal except for borderline temperature, and the remainder the exam is as described above.    Given the presence of URI symptoms, the productive cough, and the low-grade temperature, presentation is consistent with viral bronchitis versus less likely influenza or bacterial pneumonia.  There is no clinical evidence for ACS given that the chest pain is atypical and the EKG shows no ischemic findings.  Plan: Chest x-ray, basic labs, troponin x1 (no indication for repeat given the duration of the symptoms), trial of nebs, and reassess    ----------------------------------------- 9:15 PM on 08/04/2017 -----------------------------------------  Patient's workup was negative.  He felt well and wanted to go home.  I prescribed albuterol as well as  azithromycin and guaifenesin.  Return precautions were given, and the patient expressed understanding.  ____________________________________________   FINAL CLINICAL IMPRESSION(S) / ED DIAGNOSES  Final diagnoses:  Acute bronchitis, unspecified organism      NEW MEDICATIONS STARTED DURING THIS VISIT:  Discharge Medication List as of 08/04/2017  5:31 PM    START taking these medications   Details  albuterol (PROVENTIL HFA;VENTOLIN HFA) 108 (90 Base) MCG/ACT inhaler Inhale 2 puffs into the lungs every 6 (six) hours as needed for wheezing or shortness of breath., Starting Mon 08/04/2017, Print    azithromycin (ZITHROMAX) 250 MG tablet Take 1 tablet (250 mg total) by mouth daily for 4 days., Starting Tue 08/05/2017, Until Sat 08/09/2017, Print    guaiFENesin  200 MG tablet Take 2 tablets (400 mg total) by mouth every 6 (six) hours as needed for cough or to loosen phlegm., Starting Mon 08/04/2017, Print         Note:  This document was prepared using Dragon voice recognition software and may include unintentional dictation errors.    Dionne BucySiadecki, Marvie Brevik, MD 08/04/17 2115

## 2017-08-04 NOTE — ED Notes (Signed)
Called in waiting room.  No answer. 

## 2017-08-13 ENCOUNTER — Ambulatory Visit (INDEPENDENT_AMBULATORY_CARE_PROVIDER_SITE_OTHER): Payer: Self-pay | Admitting: Licensed Clinical Social Worker

## 2017-08-13 DIAGNOSIS — F331 Major depressive disorder, recurrent, moderate: Secondary | ICD-10-CM
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
## 2017-09-02 NOTE — Progress Notes (Deleted)
Primary Care Physician: Marshell Garfinkel, MD  Primary Gastroenterologist:  Dr. [name]  No chief complaint on file.   HPI: NIRAJ KUDRNA is a 57 y.o. male here after having an EGD and colonoscopy.  The patient was found to have a colonoscopy showing hyperplastic polyps that were removed. 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 The patient had a colonoscopy done for high risk surveillance and will need a repeat colonoscopy in 5 years.  Current Outpatient Medications  Medication Sig Dispense Refill  . albuterol (PROVENTIL HFA;VENTOLIN HFA) 108 (90 Base) MCG/ACT inhaler Inhale 2 puffs into the lungs every 6 (six) hours as needed for wheezing or shortness of breath. 1 Inhaler 2  . Aspirin (ASPIR-81 PO) Take 81 mg by mouth daily.    Marland Kitchen atorvastatin (LIPITOR) 40 MG tablet Take 1 tablet by mouth daily.    Marland Kitchen BIKTARVY 50-200-25 MG TABS tablet Take 1 tablet by mouth daily.    . citalopram (CELEXA) 10 MG tablet Take 1 tablet (10 mg total) by mouth daily with breakfast. 90 tablet 1  . ferrous sulfate 325 (65 FE) MG tablet Take 1 tablet (325 mg total) by mouth 2 (two) times daily with a meal. 60 tablet 0  . fluticasone (FLONASE) 50 MCG/ACT nasal spray Place 2 sprays into both nostrils as needed for allergies or rhinitis.    Marland Kitchen guaiFENesin 200 MG tablet Take 2 tablets (400 mg total) by mouth every 6 (six) hours as needed for cough or to loosen phlegm. 20 tablet 0  . hydrOXYzine (VISTARIL) 25 MG capsule Take 1 capsule (25 mg total) by mouth daily as needed (only for severe anxiety and agitation). 90 capsule 1  . lisinopril (PRINIVIL,ZESTRIL) 20 MG tablet Take 1 tablet by mouth daily.    . metFORMIN (GLUCOPHAGE) 500 MG tablet Take 1 tablet by mouth daily.    . pantoprazole (PROTONIX) 40 MG tablet Take 1 tablet (40 mg total) by mouth 2 (two) times daily. 60 tablet 0  . QUEtiapine (SEROQUEL) 25 MG tablet Take 1 tablet (25 mg total) by mouth at  bedtime. FOR SLEEP AS WELL AS MOOD , PSYCHOSIS 90 tablet 1  . sucralfate (CARAFATE) 1 GM/10ML suspension Take 10 mLs (1 g total) by mouth 4 (four) times daily -  with meals and at bedtime. 420 mL 0   No current facility-administered medications for this visit.     Allergies as of 09/03/2017  . (No Known Allergies)    ROS:  General: Negative for anorexia, weight loss, fever, chills, fatigue, weakness. ENT: Negative for hoarseness, difficulty swallowing , nasal congestion. CV: Negative for chest pain, angina, palpitations, dyspnea on exertion, peripheral edema.  Respiratory: Negative for dyspnea at rest, dyspnea on exertion, cough, sputum, wheezing.  GI: See history of present illness. GU:  Negative for dysuria, hematuria, urinary incontinence, urinary frequency, nocturnal urination.  Endo: Negative for unusual weight change.    Physical Examination:   There were no vitals taken for this visit.  General: Well-nourished, well-developed in no acute distress.  Eyes: No icterus. Conjunctivae pink. Mouth: Oropharyngeal mucosa moist and pink , no lesions erythema or exudate. Lungs: Clear to auscultation bilaterally. Non-labored. Heart: Regular rate and rhythm, no murmurs rubs or gallops.  Abdomen: Bowel sounds are normal, nontender, nondistended, no hepatosplenomegaly or masses, no abdominal bruits or hernia , no rebound or guarding.   Extremities: No lower extremity edema. No clubbing or deformities. Neuro: Alert and oriented x  3.  Grossly intact. Skin: Warm and dry, no jaundice.   Psych: Alert and cooperative, normal mood and affect.  Labs:    Imaging Studies: Dg Chest 2 View  Result Date: 08/04/2017 CLINICAL DATA:  Three days of productive cough EXAM: CHEST - 2 VIEW COMPARISON:  PA and lateral chest x-ray of November 03, 2013 FINDINGS: The lungs are adequately inflated. The interstitial markings are coarse though stable. There is no focal infiltrate. There is no pleural effusion. The  heart and pulmonary vascularity are normal. The mediastinum is normal in width. There is gentle dextrocurvature centered in the lower thoracic spine. IMPRESSION: Mild chronic bronchitic changes, stable. No pneumonia, CHF, nor other acute cardiopulmonary abnormality. Electronically Signed   By: David  [place] M.D.   On: 08/04/2017 13:03    Assessment and Plan:   BRYAR DAHMS is a 57 y.o. y/o male with a history of colon polyps and his most recent colonoscopy showed only hyperplastic polyps.  The patient will need a repeat colonoscopy in 5 years due to his history.  The patient also was found to have gastric intestinal metaplasia with active atrophic gastritis.  The patient will be set up for an EGD with gastric mapping.    [name], MD. Clementeen Graham   Note: This dictation was prepared with Dragon dictation along with smaller phrase technology. Any transcriptional errors that result from this process are unintentional.

## 2017-09-03 ENCOUNTER — Ambulatory Visit: Payer: Self-pay | Admitting: Gastroenterology

## 2017-09-05 ENCOUNTER — Ambulatory Visit: Payer: BLUE CROSS/BLUE SHIELD | Admitting: Psychiatry

## 2017-09-09 MED ORDER — BICTEGRAVIR 50 MG-EMTRICITABINE 200 MG-TENOFOVIR ALAFENAM 25 MG TABLET
ORAL_TABLET | Freq: Every day | ORAL | 5 refills | 0 days | Status: CP
Start: 2017-09-09 — End: 2018-05-11

## 2017-09-15 MED ORDER — METFORMIN 500 MG TABLET
ORAL_TABLET | Freq: Every day | ORAL | 1 refills | 0.00000 days | Status: CP
Start: 2017-09-15 — End: 2018-04-01

## 2017-09-15 MED ORDER — ATORVASTATIN 40 MG TABLET
ORAL_TABLET | Freq: Every day | ORAL | 1 refills | 0.00000 days | Status: CP
Start: 2017-09-15 — End: 2018-04-01

## 2017-09-15 MED ORDER — LISINOPRIL 20 MG TABLET
ORAL_TABLET | Freq: Every day | ORAL | 3 refills | 0.00000 days | Status: CP
Start: 2017-09-15 — End: 2018-11-03

## 2017-09-15 MED ORDER — CITALOPRAM 10 MG TABLET
ORAL_TABLET | Freq: Every day | ORAL | 1 refills | 0 days | Status: CP
Start: 2017-09-15 — End: 2018-07-09

## 2017-09-18 ENCOUNTER — Ambulatory Visit (INDEPENDENT_AMBULATORY_CARE_PROVIDER_SITE_OTHER): Payer: Self-pay | Admitting: Psychiatry

## 2017-09-18 ENCOUNTER — Telehealth: Payer: Self-pay

## 2017-09-18 ENCOUNTER — Ambulatory Visit (INDEPENDENT_AMBULATORY_CARE_PROVIDER_SITE_OTHER): Payer: BLUE CROSS/BLUE SHIELD | Admitting: Licensed Clinical Social Worker

## 2017-09-18 ENCOUNTER — Encounter: Payer: Self-pay | Admitting: Psychiatry

## 2017-09-18 VITALS — BP 146/84 | HR 76 | Temp 97.8°F | Wt 253.4 lb

## 2017-09-18 DIAGNOSIS — F331 Major depressive disorder, recurrent, moderate: Secondary | ICD-10-CM

## 2017-09-18 DIAGNOSIS — F17200 Nicotine dependence, unspecified, uncomplicated: Secondary | ICD-10-CM

## 2017-09-18 MED ORDER — HYDROXYZINE PAMOATE 25 MG PO CAPS: 25.0000 mg | ORAL_CAPSULE | Freq: Three times a day (TID) | ORAL | 1 refills | Status: DC | PRN

## 2017-09-18 MED ORDER — QUETIAPINE FUMARATE 50 MG PO TABS: 50.0000 mg | ORAL_TABLET | Freq: Every day | ORAL | 0 refills | Status: DC

## 2017-09-18 MED ORDER — CITALOPRAM HYDROBROMIDE 10 MG PO TABS: 15.0000 mg | ORAL_TABLET | Freq: Every day | ORAL | 0 refills | Status: DC

## 2017-09-18 NOTE — Telephone Encounter (Signed)
Medications sent to [facility] in [place]. Called patient to verify pharmacy info.

## 2017-09-18 NOTE — Telephone Encounter (Signed)
pt called states that [facility] uses [facility] on  [address] [place] [address]  phone # [phone]  fax # [phone]   I could not find it in our listing.
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
## 2017-09-19 ENCOUNTER — Encounter: Payer: Self-pay | Admitting: Psychiatry

## 2017-10-13 ENCOUNTER — Encounter: Payer: Self-pay | Admitting: Gastroenterology

## 2017-10-13 ENCOUNTER — Ambulatory Visit: Payer: Self-pay | Admitting: Gastroenterology

## 2017-10-13 ENCOUNTER — Encounter: Payer: Self-pay | Admitting: *Deleted

## 2017-10-16 ENCOUNTER — Ambulatory Visit: Payer: Medicaid Other | Admitting: Psychiatry

## 2017-11-03 NOTE — Progress Notes (Signed)
   THERAPIST PROGRESS NOTE  Session Time: 35mn  Participation Level: Active  Behavioral Response: CasualAlertEuthymic  Type of Therapy: Individual Therapy  Treatment Goals addressed: Coping  Interventions: CBT and Motivational Interviewing  Summary: Patrick MCCASKEYis a 57y.o. male who presents with continued symptoms of his diagnosis.  Therapist met with Patient in an initial therapy session to assess current mood and to build rapport. Therapist engaged Patient in discussion about his life and what is going well for him. Therapist provided support for Patient as he shared details about life, current stressors, mood, coping skills, and his past. Therapist prompted Patient to discuss his support system and ways that he manages daily stress, anger, and frustrations. LCSW discussed what psychotherapy is and is not and the importance of the therapeutic relationship to include open and honest communication between client and therapist and building trust.  Reviewed advantages and disadvantages of the therapeutic process and limitations to the therapeutic relationship including LCSW's role in maintaining the safety of the client, others and those in client's care.     Suicidal/Homicidal: No   Plan: Return again in 2 weeks.  Diagnosis: Axis I: Depression    Axis II: No diagnosis    NLubertha South LCSW 08/20/2017

## 2017-11-11 ENCOUNTER — Ambulatory Visit: Payer: Medicaid Other | Admitting: Licensed Clinical Social Worker

## 2017-12-02 NOTE — Progress Notes (Deleted)
Primary Care Physician: Marshell Garfinkelo, Sarah J, MD  Primary Gastroenterologist:  Dr. Midge Miniumarren Darbi Chandran  No chief complaint on file.   HPI: Patrick Galloway is a 57 y.o. male here for follow-up after a EGD and colonoscopy back in March. The patient has missed multiple appointments since then when asked to come in to review his results.  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  Current Outpatient Medications  Medication Sig Dispense Refill  . albuterol (PROVENTIL HFA;VENTOLIN HFA) 108 (90 Base) MCG/ACT inhaler Inhale 2 puffs into the lungs every 6 (six) hours as needed for wheezing or shortness of breath. 1 Inhaler 2  . Aspirin (ASPIR-81 PO) Take 81 mg by mouth daily.    Marland Kitchen. atorvastatin (LIPITOR) 40 MG tablet Take 1 tablet by mouth daily.    Marland Kitchen. BIKTARVY 50-200-25 MG TABS tablet Take 1 tablet by mouth daily.    . citalopram (CELEXA) 10 MG tablet Take 1.5 tablets (15 mg total) by mouth daily. 135 tablet 0  . ferrous sulfate 325 (65 FE) MG tablet Take 1 tablet (325 mg total) by mouth 2 (two) times daily with a meal. 60 tablet 0  . fluticasone (FLONASE) 50 MCG/ACT nasal spray Place 2 sprays into both nostrils as needed for allergies or rhinitis.    Marland Kitchen. guaiFENesin 200 MG tablet Take 2 tablets (400 mg total) by mouth every 6 (six) hours as needed for cough or to loosen phlegm. 20 tablet 0  . hydrOXYzine (VISTARIL) 25 MG capsule Take 1 capsule (25 mg total) by mouth 3 (three) times daily as needed (only for severe anxiety and agitation). 270 capsule 1  . lisinopril (PRINIVIL,ZESTRIL) 20 MG tablet Take 1 tablet by mouth daily.    . metFORMIN (GLUCOPHAGE) 500 MG tablet Take 1 tablet by mouth daily.    . pantoprazole (PROTONIX) 40 MG tablet Take 1 tablet (40 mg total) by mouth 2 (two) times daily. 60 tablet 0  . QUEtiapine (SEROQUEL) 50 MG tablet Take 1 tablet (50 mg total) by mouth at bedtime. 90 tablet 0  . sucralfate (CARAFATE) 1 GM/10ML  suspension Take 10 mLs (1 g total) by mouth 4 (four) times daily -  with meals and at bedtime. 420 mL 0   No current facility-administered medications for this visit.     Allergies as of 12/03/2017  . (No Known Allergies)    ROS:  General: Negative for anorexia, weight loss, fever, chills, fatigue, weakness. ENT: Negative for hoarseness, difficulty swallowing , nasal congestion. CV: Negative for chest pain, angina, palpitations, dyspnea on exertion, peripheral edema.  Respiratory: Negative for dyspnea at rest, dyspnea on exertion, cough, sputum, wheezing.  GI: See history of present illness. GU:  Negative for dysuria, hematuria, urinary incontinence, urinary frequency, nocturnal urination.  Endo: Negative for unusual weight change.    Physical Examination:   There were no vitals taken for this visit.  General: Well-nourished, well-developed in no acute distress.  Eyes: No icterus. Conjunctivae pink. Mouth: Oropharyngeal mucosa moist and pink , no lesions erythema or exudate. Lungs: Clear to auscultation bilaterally. Non-labored. Heart: Regular rate and rhythm, no murmurs rubs or gallops.  Abdomen: Bowel sounds are normal, nontender, nondistended, no hepatosplenomegaly or masses, no abdominal bruits or hernia , no rebound or guarding.   Extremities: No lower extremity edema. No clubbing or deformities. Neuro: Alert and oriented x 3.  Grossly intact. Skin: Warm and dry, no jaundice.   Psych: Alert and cooperative,  normal mood and affect.  Labs:  ***  Imaging Studies: No results found.  Assessment and Plan:   [name] is a 57 y.o. y/o male ***    [name], MD. Clementeen Graham   Note: This dictation was prepared with Dragon dictation along with smaller phrase technology. Any transcriptional errors that result from this process are unintentional.

## 2017-12-03 ENCOUNTER — Encounter: Payer: Self-pay | Admitting: Gastroenterology

## 2017-12-03 ENCOUNTER — Encounter: Payer: Self-pay | Admitting: *Deleted

## 2017-12-03 ENCOUNTER — Ambulatory Visit: Payer: Self-pay | Admitting: Gastroenterology

## 2017-12-15 ENCOUNTER — Ambulatory Visit: Payer: Medicaid Other | Admitting: Licensed Clinical Social Worker

## 2017-12-26 MED ORDER — OMEPRAZOLE MAGNESIUM 20 MG TABLET,DELAYED RELEASE
ORAL_TABLET | Freq: Every day | ORAL | 1 refills | 0 days | Status: CP
Start: 2017-12-26 — End: 2018-10-01

## 2018-01-07 ENCOUNTER — Ambulatory Visit (INDEPENDENT_AMBULATORY_CARE_PROVIDER_SITE_OTHER): Payer: Self-pay | Admitting: Psychiatry

## 2018-01-07 ENCOUNTER — Encounter: Payer: Self-pay | Admitting: Psychiatry

## 2018-01-07 VITALS — BP 138/86 | HR 66 | Ht 73.5 in | Wt 263.0 lb

## 2018-01-07 DIAGNOSIS — F1210 Cannabis abuse, uncomplicated: Secondary | ICD-10-CM

## 2018-01-07 DIAGNOSIS — F331 Major depressive disorder, recurrent, moderate: Secondary | ICD-10-CM

## 2018-01-07 DIAGNOSIS — F17200 Nicotine dependence, unspecified, uncomplicated: Secondary | ICD-10-CM

## 2018-01-07 MED ORDER — CITALOPRAM HYDROBROMIDE 10 MG PO TABS: 15.0000 mg | ORAL_TABLET | Freq: Every day | ORAL | 1 refills | Status: AC

## 2018-01-07 MED ORDER — QUETIAPINE FUMARATE 50 MG PO TABS: 50.0000 mg | ORAL_TABLET | Freq: Every day | ORAL | 1 refills | Status: AC

## 2018-01-07 MED ORDER — HYDROXYZINE PAMOATE 25 MG PO CAPS: 25.0000 mg | ORAL_CAPSULE | Freq: Three times a day (TID) | ORAL | 1 refills | Status: AC | PRN

## 2018-01-07 NOTE — Progress Notes (Signed)
BH MD OP Progress Note  01/07/2018 4:52 PM Patrick Galloway  MRN:  [id number]  Chief Complaint: ' I am here for follow up." Chief Complaint    Follow-up     HPI: Patrick Galloway is a 57 year old African-American male, single, employed, lives in [place], has a history of depression, tobacco use disorder, HIV positive, GI ulcers, presented to the clinic today for a follow-up visit.  Patient today reports that he ran out of all his medications a few weeks ago.  Patient has not been seen in clinic since May 2019.  Patient reports he was doing well on the medications when he was on it.  He however started decompensating once he stopped taking the medication.  He reports some anxiety and sadness ever since he got off of the medications.  He also reports sleep is disrupted.  He reports he was sleeping well on the Seroquel 50 mg and he wants to restart the same dosage.  Patient denies any perceptual disturbances.  Patient denies any suicidality.  Patient denies any homicidality.  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  He reports his work is going well.  He reports he continues to spend time with his sister.  Patient reports he has been cutting down on his smoking cigarettes and wants to continue to do the same.  Patient denies any other concerns today.   Visit Diagnosis:    ICD-10-CM   1. MDD (major depressive disorder), recurrent episode, moderate (HCC) F33.1 citalopram (CELEXA) 10 MG tablet    hydrOXYzine (VISTARIL) 25 MG capsule    QUEtiapine (SEROQUEL) 50 MG tablet  2. Tobacco use disorder F17.200   3. Cannabis use disorder, mild, abuse F12.10     Past Psychiatric History: Have reviewed past psychiatric history from my progress note on 07/21/2017.  Past Medical History:  Past Medical History:  Diagnosis Date  . Arthritis    hips and knees  . DM2 (diabetes mellitus, type 2) (HCC)   . HIV infection (HCC)   . Hyperlipemia   . Hypertension   . Immune  deficiency disorder (HCC)    PT reports HIV  . Seizures (HCC)   . Sleep apnea     Past Surgical History:  Procedure Laterality Date  . COLONOSCOPY WITH PROPOFOL N/A 07/17/2017   Procedure: COLONOSCOPY WITH PROPOFOL;  Surgeon: [name], MD;  Location: [facility];  Service: Endoscopy;  Laterality: N/A;  . ESOPHAGOGASTRODUODENOSCOPY (EGD) WITH PROPOFOL N/A 05/24/2017   Procedure: ESOPHAGOGASTRODUODENOSCOPY (EGD) WITH PROPOFOL;  Surgeon: [name], MD;  Location: [facility];  Service: Endoscopy;  Laterality: N/A;  . ESOPHAGOGASTRODUODENOSCOPY (EGD) WITH PROPOFOL N/A 07/17/2017   Procedure: ESOPHAGOGASTRODUODENOSCOPY (EGD) WITH PROPOFOL;  Surgeon: [name], MD;  Location: [facility];  Service: Endoscopy;  Laterality: N/A;  . none    . POLYPECTOMY  07/17/2017   Procedure: POLYPECTOMY;  Surgeon: [name], MD;  Location: [facility];  Service: Endoscopy;;    Family Psychiatric History: I have reviewed family psychiatric history from my progress note on 07/21/2017  Family History:  Family History  Problem Relation Age of Onset  . Heart failure Mother   . Heart failure Father     Social History: Have reviewed social history from my progress note on 07/21/2017 Social History   Socioeconomic History  . Marital status: Single    Spouse name: Not on file  . Number of children: 0  . Years of education: Not on file  . Highest education  level: High school graduate  Occupational History    Comment: full time  Social Needs  . Financial resource strain: Somewhat hard  . Food insecurity:    Worry: Often true    Inability: Often true  . Transportation needs:    Medical: Yes    Non-medical: Yes  Tobacco Use  . Smoking status: Current Every Day Smoker    Packs/day: 0.50    Years: 25.00    Pack years: 12.50    Types: Cigarettes  . Smokeless tobacco: Never Used  . Tobacco comment: Has cut back and trying to quit  Substance and Sexual Activity  .  Alcohol use: No    Frequency: Never  . Drug use: Yes    Types: Marijuana    Comment: Last used 3 months and 2 weeks ago per patient report  . Sexual activity: Not Currently  Lifestyle  . Physical activity:    Days per week: 0 days    Minutes per session: 0 min  . Stress: Very much  Relationships  . Social connections:    Talks on phone: More than three times a week    Gets together: Once a week    Attends religious service: More than 4 times per year    Active member of club or organization: No    Attends meetings of clubs or organizations: Never    Relationship status: Never married  Other Topics Concern  . Not on file  Social History Narrative  . Not on file    Allergies: No Known Allergies  Metabolic Disorder Labs: No results found for: HGBA1C, MPG No results found for: PROLACTIN No results found for: CHOL, TRIG, HDL, CHOLHDL, VLDL, LDLCALC No results found for: TSH  Therapeutic Level Labs: No results found for: LITHIUM No results found for: VALPROATE No components found for:  CBMZ  Current Medications: Current Outpatient Medications  Medication Sig Dispense Refill  . albuterol (PROVENTIL HFA;VENTOLIN HFA) 108 (90 Base) MCG/ACT inhaler Inhale 2 puffs into the lungs every 6 (six) hours as needed for wheezing or shortness of breath. 1 Inhaler 2  . atorvastatin (LIPITOR) 40 MG tablet Take 1 tablet by mouth daily.    Marland Kitchen BIKTARVY 50-200-25 MG TABS tablet Take 1 tablet by mouth daily.    . fluticasone (FLONASE) 50 MCG/ACT nasal spray Place 2 sprays into both nostrils as needed for allergies or rhinitis.    . metFORMIN (GLUCOPHAGE) 500 MG tablet Take 1 tablet by mouth daily.    . Aspirin (ASPIR-81 PO) Take 81 mg by mouth daily.    . citalopram (CELEXA) 10 MG tablet Take 1.5 tablets (15 mg total) by mouth daily. 135 tablet 1  . ferrous sulfate 325 (65 FE) MG tablet Take 1 tablet (325 mg total) by mouth 2 (two) times daily with a meal. (Patient not taking: Reported on  01/07/2018) 60 tablet 0  . guaiFENesin 200 MG tablet Take 2 tablets (400 mg total) by mouth every 6 (six) hours as needed for cough or to loosen phlegm. (Patient not taking: Reported on 01/07/2018) 20 tablet 0  . hydrOXYzine (VISTARIL) 25 MG capsule Take 1 capsule (25 mg total) by mouth 3 (three) times daily as needed (only for severe anxiety and agitation). 270 capsule 1  . lisinopril (PRINIVIL,ZESTRIL) 20 MG tablet Take 1 tablet by mouth daily.    . pantoprazole (PROTONIX) 40 MG tablet Take 1 tablet (40 mg total) by mouth 2 (two) times daily. (Patient not taking: Reported on 01/07/2018) 60 tablet 0  .  QUEtiapine (SEROQUEL) 50 MG tablet Take 1 tablet (50 mg total) by mouth at bedtime. 90 tablet 1  . sucralfate (CARAFATE) 1 GM/10ML suspension Take 10 mLs (1 g total) by mouth 4 (four) times daily -  with meals and at bedtime. (Patient not taking: Reported on 01/07/2018) 420 mL 0   No current facility-administered medications for this visit.      Musculoskeletal: Strength & Muscle Tone: within normal limits Gait & Station: normal Patient leans: N/A  Psychiatric Specialty Exam: Review of Systems  Psychiatric/Behavioral: Positive for depression. The patient is nervous/anxious and has insomnia.   All other systems reviewed and are negative.   Blood pressure 138/86, pulse 66, height 6' 1.5" (1.867 m), weight 263 lb (119.3 kg), SpO2 94 %.Body mass index is 34.23 kg/m.  General Appearance: Casual  Eye Contact:  Fair  Speech:  Clear and Coherent  Volume:  Normal  Mood:  Anxious  Affect:  Congruent  Thought Process:  Goal Directed and Descriptions of Associations: Intact  Orientation:  Full (Time, Place, and Person)  Thought Content: Logical   Suicidal Thoughts:  No  Homicidal Thoughts:  No  Memory:  Immediate;   Fair Recent;   Fair Remote;   Fair  Judgement:  Fair  Insight:  Fair  Psychomotor Activity:  Normal  Concentration:  Concentration: Fair and Attention Span: Fair  Recall:  [employer] of Knowledge: Fair  Language: Fair  Akathisia:  No  Handed:  Right  AIMS (if indicated): 0  Assets:  Communication Skills Desire for Improvement Social Support  ADL's:  Intact  Cognition: WNL  Sleep:  Poor   Screenings:   Assessment and Plan: Mykeal is a 57 year old African-American male, single, employed, lives in [place], has a history of depression, tobacco use disorder, history of HIV, presented to the clinic today for a follow-up visit.  Patient today reports he ran out of all his medications since he was not able to keep up with his follow-up visits here.  Patient also has not had psychotherapy visit with Ms. Joni Reining in a long time.  Patient reports he wants to restart his medications as well as restart psychotherapy since he has been having some mood symptoms and sleep problems.  Patient denies any suicidality or homicidality.  Plan as noted below.  Plan MDD Continue Celexa 15 mg p.o. daily Seroquel 50 mg p.o. Nightly.  For insomnia Seroquel 50 mg p.o. nightly Hydroxyzine 25 mg p.o. 3 times daily as needed  For tobacco use disorder Provided smoking cessation counseling.  Patient will start psychotherapy sessions again with Ms. Nolon Rod here in clinic.  Follow-up in clinic in 2 months or sooner if needed.  More than 50 % of the time was spent for psychoeducation and supportive psychotherapy and care coordination.  This note was generated in part or whole with voice recognition software. Voice recognition is usually quite accurate but there are transcription errors that can and very often do occur. I apologize for any typographical errors that were not detected and corrected.         Jomarie Longs, MD 01/07/2018, 4:52 PM

## 2018-01-11 ENCOUNTER — Emergency Department
Admission: EM | Admit: 2018-01-11 | Discharge: 2018-01-11 | Disposition: A | Discharge status: Home / Self Care | Payer: Medicaid Other | Attending: Emergency Medicine | Admitting: Emergency Medicine

## 2018-01-11 ENCOUNTER — Encounter: Payer: Self-pay | Admitting: Emergency Medicine

## 2018-01-11 ENCOUNTER — Other Ambulatory Visit: Payer: Self-pay

## 2018-01-11 ENCOUNTER — Emergency Department: Payer: Medicaid Other

## 2018-01-11 DIAGNOSIS — E119 Type 2 diabetes mellitus without complications: Secondary | ICD-10-CM | POA: Insufficient documentation

## 2018-01-11 DIAGNOSIS — Z7984 Long term (current) use of oral hypoglycemic drugs: Secondary | ICD-10-CM | POA: Insufficient documentation

## 2018-01-11 DIAGNOSIS — Z7982 Long term (current) use of aspirin: Secondary | ICD-10-CM | POA: Insufficient documentation

## 2018-01-11 DIAGNOSIS — F329 Major depressive disorder, single episode, unspecified: Secondary | ICD-10-CM | POA: Insufficient documentation

## 2018-01-11 DIAGNOSIS — F17210 Nicotine dependence, cigarettes, uncomplicated: Secondary | ICD-10-CM | POA: Insufficient documentation

## 2018-01-11 DIAGNOSIS — Z21 Asymptomatic human immunodeficiency virus [HIV] infection status: Secondary | ICD-10-CM | POA: Insufficient documentation

## 2018-01-11 DIAGNOSIS — I10 Essential (primary) hypertension: Secondary | ICD-10-CM | POA: Insufficient documentation

## 2018-01-11 DIAGNOSIS — R079 Chest pain, unspecified: Secondary | ICD-10-CM | POA: Insufficient documentation

## 2018-01-11 DIAGNOSIS — Z79899 Other long term (current) drug therapy: Secondary | ICD-10-CM | POA: Insufficient documentation

## 2018-01-11 LAB — CBC
HCT: 45.2 % (ref 40.0–52.0)
Hemoglobin: 15.5 g/dL (ref 13.0–18.0)
MCH: 32.2 pg (ref 26.0–34.0)
MCHC: 34.2 g/dL (ref 32.0–36.0)
MCV: 94.2 fL (ref 80.0–100.0)
PLATELETS: 194 10*3/uL (ref 150–440)
RBC: 4.8 MIL/uL (ref 4.40–5.90)
RDW: 14.5 % (ref 11.5–14.5)
WBC: 5.1 10*3/uL (ref 3.8–10.6)

## 2018-01-11 LAB — BASIC METABOLIC PANEL
Anion gap: 6 (ref 5–15)
BUN: 16 mg/dL (ref 6–20)
CALCIUM: 8.8 mg/dL — AB (ref 8.9–10.3)
CO2: 26 mmol/L (ref 22–32)
CREATININE: 0.77 mg/dL (ref 0.61–1.24)
Chloride: 107 mmol/L (ref 98–111)
GFR calc Af Amer: >60 mL/min (ref >60)
GLUCOSE: 126 mg/dL — AB (ref 70–99)
Potassium: 3.6 mmol/L (ref 3.5–5.1)
Sodium: 139 mmol/L (ref 135–145)

## 2018-01-11 LAB — FIBRIN DERIVATIVES D-DIMER (ARMC ONLY): Fibrin derivatives D-dimer (ARMC): 256.87 ng/mL (FEU) (ref 0.00–499.00)

## 2018-01-11 LAB — TROPONIN I

## 2018-01-11 NOTE — Discharge Instructions (Signed)
You have been seen in the Emergency Department (ED) today for chest pain.  As we have discussed today?s test results are normal, but you may require further testing. ° °Please follow up with the recommended doctor as instructed above in these documents regarding today?s emergent visit and your recent symptoms to discuss further management.  ° °Return to the Emergency Department (ED) if you experience any further chest pain/pressure/tightness, difficulty breathing, or sudden sweating, or other symptoms that concern you. ° °

## 2018-01-11 NOTE — ED Provider Notes (Signed)
[facility] Emergency Department Provider Note   ____________________________________________   First MD Initiated Contact with Patient 01/11/18 1237     (approximate)  I have reviewed the triage vital signs and the nursing notes.   HISTORY  Chief Complaint Chest Pain    HPI Patrick Galloway is a 57 y.o. male here for evaluation of chest pain  Patient reports he is been having a slight discomfort in his left chest.  For about 2 days now has been feeling a slight "burning" feeling in the muscle of his left chest and is noticed that if he pushes right on it with his finger goes away.  He reports that this been fairly steady, gets better if he pushes on it.  No rash.  No pain or radiation of the neck or back.  No pain.  No nausea or vomiting.  Overall reports her headaches feels pretty good, just feels like a slight tingling or twinge feeling like a burning or pinched feeling in the left chest muscle.  Time it seems to slightly worse if he takes a deep breath and occasionally has a very slight sense sharp feeling.  No fevers or chills.  No recent hospital stays.  No history of blood clots.  No cough.  No fever chills.  No leg swelling.  Past Medical History:  Diagnosis Date  . Arthritis    hips and knees  . DM2 (diabetes mellitus, type 2) (HCC)   . HIV infection (HCC)   . Hyperlipemia   . Hypertension   . Immune deficiency disorder (HCC)    PT reports HIV  . Seizures (HCC)   . Sleep apnea     Patient Active Problem List   Diagnosis Date Noted  . Anemia 07/23/2017  . MDD (major depressive disorder), recurrent episode, moderate (HCC) 07/21/2017  . Acute gastrointestinal ulcer with hemorrhage   . History of colonic polyps   . Rectal polyp   . HIV (human immunodeficiency virus infection) (HCC) 07/11/2017  . Acute gastric ulcer with hemorrhage   . Melena   . Upper GI bleed 05/23/2017  . HIV test positive (HCC) 04/30/2017  . Mixed hyperlipidemia  01/24/2016  . Hypertension associated with diabetes (HCC) 01/24/2016  . Diabetes mellitus type 2 in obese (HCC) 01/24/2016  . Other chest pain 04/18/2015  . URI, acute 10/07/2014  . Perirectal cyst 09/30/2014  . Bone spur 09/30/2014  . Hyperthyroidism 09/07/2014  . Tobacco dependence 07/08/2014  . 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 Pterygium 04/12/2011  . Obesity 10/19/2010  . Primary localized osteoarthrosis, lower leg 09/26/2010    Past Surgical History:  Procedure Laterality Date  . COLONOSCOPY WITH PROPOFOL N/A 07/17/2017   Procedure: COLONOSCOPY WITH PROPOFOL;  Surgeon: [name], MD;  Location: [facility];  Service: Endoscopy;  Laterality: N/A;  . ESOPHAGOGASTRODUODENOSCOPY (EGD) WITH PROPOFOL N/A 05/24/2017   Procedure: ESOPHAGOGASTRODUODENOSCOPY (EGD) WITH PROPOFOL;  Surgeon: [name], MD;  Location: [facility];  Service: Endoscopy;  Laterality: N/A;  . ESOPHAGOGASTRODUODENOSCOPY (EGD) WITH PROPOFOL N/A 07/17/2017   Procedure: ESOPHAGOGASTRODUODENOSCOPY (EGD) WITH PROPOFOL;  Surgeon: [name], MD;  Location: [facility];  Service: Endoscopy;  Laterality: N/A;  . none    . POLYPECTOMY  07/17/2017   Procedure: POLYPECTOMY;  Surgeon: [name], MD;  Location: [facility];  Service: Endoscopy;;    Prior to Admission medications   Medication Sig Start Date End Date Taking? Authorizing Provider  albuterol (PROVENTIL HFA;VENTOLIN HFA) 108 (90 Base) MCG/ACT inhaler  Inhale 2 puffs into the lungs every 6 (six) hours as needed for wheezing or shortness of breath. 08/04/17   Dionne Bucy, MD  Aspirin (ASPIR-81 PO) Take 81 mg by mouth daily. 11/03/12   [provider]  atorvastatin (LIPITOR) 40 MG tablet Take 1 tablet by mouth daily. 05/08/17   [provider]  BIKTARVY 50-200-25 MG TABS tablet Take 1 tablet by mouth daily. 05/20/17   [provider]  citalopram (CELEXA) 10 MG tablet Take 1.5 tablets (15 mg total) by mouth  daily. 01/07/18   Jomarie Longs, MD  ferrous sulfate 325 (65 FE) MG tablet Take 1 tablet (325 mg total) by mouth 2 (two) times daily with a meal. Patient not taking: Reported on 01/07/2018 05/26/17   Katha Hamming, MD  fluticasone (FLONASE) 50 MCG/ACT nasal spray Place 2 sprays into both nostrils as needed for allergies or rhinitis.    [provider]  guaiFENesin 200 MG tablet Take 2 tablets (400 mg total) by mouth every 6 (six) hours as needed for cough or to loosen phlegm. Patient not taking: Reported on 01/07/2018 08/04/17   Dionne Bucy, MD  hydrOXYzine (VISTARIL) 25 MG capsule Take 1 capsule (25 mg total) by mouth 3 (three) times daily as needed (only for severe anxiety and agitation). 01/07/18   Jomarie Longs, MD  lisinopril (PRINIVIL,ZESTRIL) 20 MG tablet Take 1 tablet by mouth daily. 05/08/17   [provider]  metFORMIN (GLUCOPHAGE) 500 MG tablet Take 1 tablet by mouth daily. 05/08/17   [provider]  pantoprazole (PROTONIX) 40 MG tablet Take 1 tablet (40 mg total) by mouth 2 (two) times daily. Patient not taking: Reported on 01/07/2018 05/26/17   Katha Hamming, MD  QUEtiapine (SEROQUEL) 50 MG tablet Take 1 tablet (50 mg total) by mouth at bedtime. 01/07/18   Jomarie Longs, MD  sucralfate (CARAFATE) 1 GM/10ML suspension Take 10 mLs (1 g total) by mouth 4 (four) times daily -  with meals and at bedtime. Patient not taking: Reported on 01/07/2018 05/26/17   Katha Hamming, MD    Allergies Patient has no known allergies.  Family History  Problem Relation Age of Onset  . Heart failure Mother   . Heart failure Father     Social History Social History   Tobacco Use  . Smoking status: Current Every Day Smoker    Packs/day: 0.50    Years: 25.00    Pack years: 12.50    Types: Cigarettes  . Smokeless tobacco: Never Used  . Tobacco comment: Has cut back and trying to quit  Substance Use Topics  . Alcohol use: No    Frequency: Never  . Drug  use: Yes    Types: Marijuana    Comment: Last used 3 months and 2 weeks ago per patient report    Review of Systems Constitutional: No fever/chills Eyes: No visual changes. ENT: No sore throat. Cardiovascular: See HPI Respiratory: Denies shortness of breath. Gastrointestinal: No abdominal pain.  No nausea, no vomiting.  No diarrhea.  No constipation. Genitourinary: Negative for dysuria. Musculoskeletal: Negative for back pain. Skin: Negative for rash. Neurological: Negative for headaches, focal weakness or numbness.  Denies history of heart disease.  No heavy chest pressure.  Does not worsen with exertion.  Does smoke  ____________________________________________   PHYSICAL EXAM:  VITAL SIGNS: ED Triage Vitals  Enc Vitals Group     BP 01/11/18 1031 127/70     Pulse Rate 01/11/18 1031 70     Resp 01/11/18 1031  16     Temp 01/11/18 1030 98.5 F (36.9 C)     Temp Source 01/11/18 1030 Oral     SpO2 01/11/18 1031 94 %     Weight 01/11/18 1030 253 lb (114.8 kg)     Height 01/11/18 1030 6\' 1"  (1.854 m)     Head Circumference --      Peak Flow --      Pain Score 01/11/18 1030 7     Pain Loc --      Pain Edu? --      Excl. in GC? --     Constitutional: Alert and oriented. Well appearing and in no acute distress. Eyes: Conjunctivae are normal. Head: Atraumatic. Nose: No congestion/rhinnorhea. Mouth/Throat: Mucous membranes are moist. Neck: No stridor.   Cardiovascular: Normal rate, regular rhythm. Grossly normal heart sounds.  Good peripheral circulation.  Respiratory: Normal respiratory effort.  No retractions. Lungs CTAB.  Patient has an area just lateral to the left nipple where he is able to press and reports discomfort is located but pain goes away if he pushes on it.  No overlying lesions or rashes. Gastrointestinal: Soft and nontender. No distention. Musculoskeletal: No lower extremity tenderness nor edema. Neurologic:  Normal speech and language. No gross focal  neurologic deficits are appreciated.  Skin:  Skin is warm, dry and intact. No rash noted. Psychiatric: Mood and affect are normal. Speech and behavior are normal.  ____________________________________________   LABS (all labs ordered are listed, but only abnormal results are displayed)  Labs Reviewed  BASIC METABOLIC PANEL - Abnormal; Notable for the following components:      Result Value   Glucose, Bld 126 (*)    Calcium 8.8 (*)    All other components within normal limits  CBC  TROPONIN I  TROPONIN I  FIBRIN DERIVATIVES D-DIMER ([facility] ONLY)   ____________________________________________  EKG  ED ECG REPORT I, Sharyn Creamer, the attending physician, personally viewed and interpreted this ECG.  Date: 01/11/2018 EKG Time: 1030 Rate: 70 Rhythm: normal sinus rhythm QRS Axis: normal Intervals: normal ST/T Wave abnormalities: normal Narrative Interpretation: no evidence of acute ischemia, compared with previous no significant changes found  ____________________________________________  RADIOLOGY  Dg Chest 2 View  Result Date: 01/11/2018 CLINICAL DATA:  Left-sided chest pain. EXAM: CHEST - 2 VIEW COMPARISON:  None. FINDINGS: The heart size and mediastinal contours are within normal limits. Both lungs are clear. The visualized skeletal structures are unremarkable. IMPRESSION: No active cardiopulmonary disease. Electronically Signed   By: Gerome Sam III M.D   On: 01/11/2018 12:06    ____________________________________________   PROCEDURES  Procedure(s) performed: None  Procedures  Critical Care performed: No  ____________________________________________   INITIAL IMPRESSION / ASSESSMENT AND PLAN / ED COURSE  Pertinent labs & imaging results that were available during my care of the patient were reviewed by me and considered in my medical decision making (see chart for details).   Differential diagnosis includes, but is not limited to, ACS, aortic dissection,  pulmonary embolism, cardiac tamponade, pneumothorax, pneumonia, pericarditis, myocarditis, GI-related causes including esophagitis/gastritis, and musculoskeletal chest wall pain.  His EKG and first troponin very reassuring.  His symptoms very atypical of acute coronary syndrome.  Appears to be low risk by HEART score.  No ripping tearing or moving pain.  No hypertension.  Doubt dissection or pulmonary embolism, low risk by d-dimer and he is a smoker with a slight pleuritic component of discomfort and thus will order a d-dimer.  Also check  second troponin.  Patient reports aspirin and ibuprofen have given him significant ulcers with GI bleeding in the past, given the unlikely nature of this pain being cardiac I will withhold salicylate therapy at this time.  D-dimer normal.  ----------------------------------------- 3:21 PM on 01/11/2018 -----------------------------------------  Patient resting comfortably, no distress. Return precautions and treatment recommendations and follow-up discussed with the patient who is agreeable with the plan.        ____________________________________________   FINAL CLINICAL IMPRESSION(S) / ED DIAGNOSES  Final diagnoses:  Chest pain with low risk for cardiac etiology      NEW MEDICATIONS STARTED DURING THIS VISIT:  New Prescriptions   No medications on file     Note:  This document was prepared using Dragon voice recognition software and may include unintentional dictation errors.     Sharyn Creamer, MD 01/11/18 [phone]

## 2018-01-11 NOTE — ED Triage Notes (Signed)
Pt to ED via POV with c/o sharp CP x2 days with radiation to LFT arm. Pt in NAD, VSS, ambulatory.

## 2018-01-11 NOTE — ED Notes (Signed)
First Nurse Note: Patient complaining of chest pain, taken to Triage 3 for EKG.

## 2018-01-19 ENCOUNTER — Ambulatory Visit (HOSPITAL_BASED_OUTPATIENT_CLINIC_OR_DEPARTMENT_OTHER): Payer: Medicaid Other | Admitting: Licensed Clinical Social Worker

## 2018-01-19 DIAGNOSIS — F331 Major depressive disorder, recurrent, moderate: Secondary | ICD-10-CM

## 2018-01-21 ENCOUNTER — Ambulatory Visit (INDEPENDENT_AMBULATORY_CARE_PROVIDER_SITE_OTHER): Payer: Self-pay | Admitting: Gastroenterology

## 2018-01-21 ENCOUNTER — Encounter: Payer: Self-pay | Admitting: Gastroenterology

## 2018-01-21 ENCOUNTER — Other Ambulatory Visit: Payer: Self-pay

## 2018-01-21 VITALS — BP 138/83 | HR 58 | Ht 76.0 in | Wt 270.0 lb

## 2018-01-21 DIAGNOSIS — K3189 Other diseases of stomach and duodenum: Secondary | ICD-10-CM

## 2018-01-21 DIAGNOSIS — K31A0 Gastric intestinal metaplasia, unspecified: Secondary | ICD-10-CM

## 2018-01-21 NOTE — Progress Notes (Signed)
Primary Care Physician: Marshell Garfinkelo, Sarah J, MD  Primary Gastroenterologist:  Dr. Midge Miniumarren Chantel Teti  No chief complaint on file.   HPI: Patrick RobinsonCalvert W Galloway is a 57 y.o. male here for follow-up after having an EGD.  The patient had an EGD back in January with a gastric ulcer that was treated with hemoclips and the patient then subsequently came back 2 months later for evaluation with complete healing of the ulcer.  The patient did have biopsies of the stomach taken which showed gastric intestinal metaplasia without any sign of dysplasia or H. Pylori.  Patient has been doing well without any complaints at the present time.  Current Outpatient Medications  Medication Sig Dispense Refill  . albuterol (PROVENTIL HFA;VENTOLIN HFA) 108 (90 Base) MCG/ACT inhaler Inhale 2 puffs into the lungs every 6 (six) hours as needed for wheezing or shortness of breath. 1 Inhaler 2  . Aspirin (ASPIR-81 PO) Take 81 mg by mouth daily.    Marland Kitchen. atorvastatin (LIPITOR) 40 MG tablet Take 1 tablet by mouth daily.    Marland Kitchen. BIKTARVY 50-200-25 MG TABS tablet Take 1 tablet by mouth daily.    . citalopram (CELEXA) 10 MG tablet Take 1.5 tablets (15 mg total) by mouth daily. 135 tablet 1  . ferrous sulfate 325 (65 FE) MG tablet Take 1 tablet (325 mg total) by mouth 2 (two) times daily with a meal. (Patient not taking: Reported on 01/07/2018) 60 tablet 0  . fluticasone (FLONASE) 50 MCG/ACT nasal spray Place 2 sprays into both nostrils as needed for allergies or rhinitis.    Marland Kitchen. guaiFENesin 200 MG tablet Take 2 tablets (400 mg total) by mouth every 6 (six) hours as needed for cough or to loosen phlegm. (Patient not taking: Reported on 01/07/2018) 20 tablet 0  . hydrOXYzine (VISTARIL) 25 MG capsule Take 1 capsule (25 mg total) by mouth 3 (three) times daily as needed (only for severe anxiety and agitation). 270 capsule 1  . lisinopril (PRINIVIL,ZESTRIL) 20 MG tablet Take 1 tablet by mouth daily.    . metFORMIN (GLUCOPHAGE) 500 MG tablet Take 1 tablet by  mouth daily.    . pantoprazole (PROTONIX) 40 MG tablet Take 1 tablet (40 mg total) by mouth 2 (two) times daily. (Patient not taking: Reported on 01/07/2018) 60 tablet 0  . QUEtiapine (SEROQUEL) 50 MG tablet Take 1 tablet (50 mg total) by mouth at bedtime. 90 tablet 1  . sucralfate (CARAFATE) 1 GM/10ML suspension Take 10 mLs (1 g total) by mouth 4 (four) times daily -  with meals and at bedtime. (Patient not taking: Reported on 01/07/2018) 420 mL 0   No current facility-administered medications for this visit.     Allergies as of 01/21/2018  . (No Known Allergies)    ROS:  General: Negative for anorexia, weight loss, fever, chills, fatigue, weakness. ENT: Negative for hoarseness, difficulty swallowing , nasal congestion. CV: Negative for chest pain, angina, palpitations, dyspnea on exertion, peripheral edema.  Respiratory: Negative for dyspnea at rest, dyspnea on exertion, cough, sputum, wheezing.  GI: See history of present illness. GU:  Negative for dysuria, hematuria, urinary incontinence, urinary frequency, nocturnal urination.  Endo: Negative for unusual weight change.    Physical Examination:   There were no vitals taken for this visit.  General: Well-nourished, well-developed in no acute distress.  Eyes: No icterus. Conjunctivae pink. Extremities: No lower extremity edema. No clubbing or deformities. Neuro: Alert and oriented x 3.  Grossly intact. Skin: Warm and dry, no jaundice.  Psych: Alert and cooperative, normal mood and affect.  Labs:    Imaging Studies: Dg Chest 2 View  Result Date: 01/11/2018 CLINICAL DATA:  Left-sided chest pain. EXAM: CHEST - 2 VIEW COMPARISON:  None. FINDINGS: The heart size and mediastinal contours are within normal limits. Both lungs are clear. The visualized skeletal structures are unremarkable. IMPRESSION: No active cardiopulmonary disease. Electronically Signed   By: Gerome Sam III M.D   On: 01/11/2018 12:06    Assessment and Plan:    Patrick Galloway is a 57 y.o. y/o male who has a history of gastric intestinal metaplasia and peptic ulcer disease.  The patient will need an EGD for gastric mapping.  The patient will then need repeat EGDs every 2 years to rule out any transformation into malignancy.  The patient has been explained the plan and agrees with it.    [name], MD. Clementeen Graham   Note: This dictation was prepared with Dragon dictation along with smaller phrase technology. Any transcriptional errors that result from this process are unintentional.

## 2018-02-02 ENCOUNTER — Encounter: Payer: Self-pay | Admitting: *Deleted

## 2018-02-02 ENCOUNTER — Other Ambulatory Visit: Payer: Self-pay

## 2018-02-02 NOTE — Progress Notes (Signed)
   THERAPIST PROGRESS NOTE  Session Time: 71mn   Participation Level: Active  Behavioral Response: CasualAlertEuthymic  Type of Therapy: Individual Therapy  Treatment Goals addressed: Coping  Interventions: CBT and Motivational Interviewing  Summary: Patrick KATHANis a 57y.o. male who presents with a reduction in symptoms. Therapist met with Patient in an outpatient setting to assess current mood and assist with making progress towards goals through the use of therapeutic intervention. Therapist did a brief mood check, assessing anger, fear, disgust, excitement, happiness, and sadness.  Patient reports "happy mood" due to him continuing to live alone and working full time.  Patient reports that his symptoms have decreased but he continues to feel sad at times.  With the use of [employer], Patient was able to use guided imagery to relax while in session.   Suicidal/Homicidal: No  Plan: Return again in 2 weeks.  Diagnosis: Axis I: Depression    Axis II: No diagnosis    NLubertha South LCSW 01/19/2018

## 2018-02-05 NOTE — Discharge Instructions (Signed)
General Anesthesia, Adult, Care After °These instructions provide you with information about caring for yourself after your procedure. Your health care provider may also give you more specific instructions. Your treatment has been planned according to current medical practices, but problems sometimes occur. Call your health care provider if you have any problems or questions after your procedure. °What can I expect after the procedure? °After the procedure, it is common to have: °· Vomiting. °· A sore throat. °· Mental slowness. ° °It is common to feel: °· Nauseous. °· Cold or shivery. °· Sleepy. °· Tired. °· Sore or achy, even in parts of your body where you did not have surgery. ° °Follow these instructions at home: °For at least 24 hours after the procedure: °· Do not: °? Participate in activities where you could fall or become injured. °? Drive. °? Use heavy machinery. °? Drink alcohol. °? Take sleeping pills or medicines that cause drowsiness. °? Make important decisions or sign legal documents. °? Take care of children on your own. °· Rest. °Eating and drinking °· If you vomit, drink water, juice, or soup when you can drink without vomiting. °· Drink enough fluid to keep your urine clear or pale yellow. °· Make sure you have little or no nausea before eating solid foods. °· Follow the diet recommended by your health care provider. °General instructions °· Have a responsible adult stay with you until you are awake and alert. °· Return to your normal activities as told by your health care provider. Ask your health care provider what activities are safe for you. °· Take over-the-counter and prescription medicines only as told by your health care provider. °· If you smoke, do not smoke without supervision. °· Keep all follow-up visits as told by your health care provider. This is important. °Contact a health care provider if: °· You continue to have nausea or vomiting at home, and medicines are not helpful. °· You  cannot drink fluids or start eating again. °· You cannot urinate after 8-12 hours. °· You develop a skin rash. °· You have fever. °· You have increasing redness at the site of your procedure. °Get help right away if: °· You have difficulty breathing. °· You have chest pain. °· You have unexpected bleeding. °· You feel that you are having a life-threatening or urgent problem. °This information is not intended to replace advice given to you by your health care provider. Make sure you discuss any questions you have with your health care provider. °Document Released: 07/29/2000 Document Revised: 09/25/2015 Document Reviewed: 04/06/2015 °Elsevier Interactive Patient Education © 2018 Elsevier Inc. ° °

## 2018-02-06 ENCOUNTER — Ambulatory Visit: Payer: Self-pay | Admitting: Anesthesiology

## 2018-02-06 ENCOUNTER — Ambulatory Visit
Admission: RE | Admit: 2018-02-06 | Discharge: 2018-02-06 | Disposition: A | Discharge status: Home / Self Care | Payer: Self-pay | Source: Ambulatory Visit | Attending: Gastroenterology | Admitting: Gastroenterology

## 2018-02-06 ENCOUNTER — Encounter
Admission: RE | Disposition: A | Discharge status: Home / Self Care | Payer: Self-pay | Source: Ambulatory Visit | Attending: Gastroenterology

## 2018-02-06 DIAGNOSIS — B9681 Helicobacter pylori [H. pylori] as the cause of diseases classified elsewhere: Secondary | ICD-10-CM | POA: Insufficient documentation

## 2018-02-06 DIAGNOSIS — E785 Hyperlipidemia, unspecified: Secondary | ICD-10-CM | POA: Insufficient documentation

## 2018-02-06 DIAGNOSIS — K31A0 Gastric intestinal metaplasia, unspecified: Secondary | ICD-10-CM

## 2018-02-06 DIAGNOSIS — Z79899 Other long term (current) drug therapy: Secondary | ICD-10-CM | POA: Insufficient documentation

## 2018-02-06 DIAGNOSIS — G4730 Sleep apnea, unspecified: Secondary | ICD-10-CM | POA: Insufficient documentation

## 2018-02-06 DIAGNOSIS — K449 Diaphragmatic hernia without obstruction or gangrene: Secondary | ICD-10-CM | POA: Insufficient documentation

## 2018-02-06 DIAGNOSIS — Z7984 Long term (current) use of oral hypoglycemic drugs: Secondary | ICD-10-CM | POA: Insufficient documentation

## 2018-02-06 DIAGNOSIS — K2950 Unspecified chronic gastritis without bleeding: Secondary | ICD-10-CM | POA: Insufficient documentation

## 2018-02-06 DIAGNOSIS — F17210 Nicotine dependence, cigarettes, uncomplicated: Secondary | ICD-10-CM | POA: Insufficient documentation

## 2018-02-06 DIAGNOSIS — I10 Essential (primary) hypertension: Secondary | ICD-10-CM | POA: Insufficient documentation

## 2018-02-06 DIAGNOSIS — K3189 Other diseases of stomach and duodenum: Secondary | ICD-10-CM

## 2018-02-06 DIAGNOSIS — E119 Type 2 diabetes mellitus without complications: Secondary | ICD-10-CM | POA: Insufficient documentation

## 2018-02-06 HISTORY — PX: ESOPHAGOGASTRODUODENOSCOPY (EGD) WITH PROPOFOL: SHX5813

## 2018-02-06 LAB — GLUCOSE, CAPILLARY
Glucose-Capillary: 69 mg/dL — ABNORMAL LOW (ref 70–99)
Glucose-Capillary: 76 mg/dL (ref 70–99)

## 2018-02-06 SURGERY — ESOPHAGOGASTRODUODENOSCOPY (EGD) WITH PROPOFOL
Anesthesia: General | Site: Throat

## 2018-02-06 MED ORDER — STERILE WATER FOR IRRIGATION IR SOLN
Status: DC | PRN
  Administered 2018-02-06: .05 mL

## 2018-02-06 MED ORDER — PROPOFOL 10 MG/ML IV BOLUS
INTRAVENOUS | Status: DC | PRN
  Administered 2018-02-06: 50 mg via INTRAVENOUS
  Administered 2018-02-06: 40 mg via INTRAVENOUS
  Administered 2018-02-06: 50 mg via INTRAVENOUS
  Administered 2018-02-06: 40 mg via INTRAVENOUS
  Administered 2018-02-06: 100 mg via INTRAVENOUS
  Administered 2018-02-06: 40 mg via INTRAVENOUS

## 2018-02-06 MED ORDER — LACTATED RINGERS IV SOLN
INTRAVENOUS | Status: DC
  Administered 2018-02-06: 10:00:00 via INTRAVENOUS

## 2018-02-06 MED ORDER — GLYCOPYRROLATE 0.2 MG/ML IJ SOLN
INTRAMUSCULAR | Status: DC | PRN
  Administered 2018-02-06: 0.1 mg via INTRAVENOUS

## 2018-02-06 MED ORDER — LIDOCAINE HCL (CARDIAC) PF 100 MG/5ML IV SOSY
PREFILLED_SYRINGE | INTRAVENOUS | Status: DC | PRN
  Administered 2018-02-06: 30 mg via INTRAVENOUS

## 2018-02-06 MED ORDER — SODIUM CHLORIDE 0.9 % IV SOLN: INTRAVENOUS | Status: DC

## 2018-02-06 SURGICAL SUPPLY — 7 items
BLOCK BITE 60FR ADLT L/F GRN (MISCELLANEOUS) ×2 IMPLANT
CANISTER SUCT 1200ML W/VALVE (MISCELLANEOUS) ×2 IMPLANT
FORCEPS BIOP RAD 4 LRG CAP 4 (CUTTING FORCEPS) ×2 IMPLANT
GOWN CVR UNV OPN BCK APRN NK (MISCELLANEOUS) ×2 IMPLANT
GOWN ISOL THUMB LOOP REG UNIV (MISCELLANEOUS) ×2
KIT ENDO PROCEDURE OLY (KITS) ×2 IMPLANT
WATER STERILE IRR 250ML POUR (IV SOLUTION) ×2 IMPLANT

## 2018-02-06 NOTE — Anesthesia Preprocedure Evaluation (Addendum)
Anesthesia Evaluation  Patient identified by MRN, date of birth, ID band Patient awake    Reviewed: Allergy & Precautions, NPO status , Patient's Chart, lab work & pertinent test results, reviewed documented beta blocker date and time   Airway Mallampati: II  TM Distance: >3 FB Neck ROM: Full    Dental no notable dental hx.    Pulmonary sleep apnea , Current Smoker,    Pulmonary exam normal breath sounds clear to auscultation       Cardiovascular hypertension, Normal cardiovascular exam Rhythm:Regular Rate:Normal     Neuro/Psych Seizures -,  Depression    GI/Hepatic Neg liver ROS, PUD,   Endo/Other  diabetes  Renal/GU negative Renal ROS  negative genitourinary   Musculoskeletal  (+) Arthritis ,   Abdominal (+) + obese,   Peds  Hematology   Anesthesia Other Findings   Reproductive/Obstetrics                            Anesthesia Physical Anesthesia Plan  ASA: III  Anesthesia Plan: General   Post-op Pain Management:    Induction: Intravenous  PONV Risk Score and Plan:   Airway Management Planned: Natural Airway  Additional Equipment: None  Intra-op Plan:   Post-operative Plan:   Informed Consent: I have reviewed the patients History and Physical, chart, labs and discussed the procedure including the risks, benefits and alternatives for the proposed anesthesia with the patient or authorized representative who has indicated his/her understanding and acceptance.     Plan Discussed with: CRNA, Anesthesiologist and Surgeon  Anesthesia Plan Comments:         Anesthesia Quick Evaluation

## 2018-02-06 NOTE — Anesthesia Postprocedure Evaluation (Signed)
Anesthesia Post Note  Patient: Patrick Galloway  Procedure(s) Performed: ESOPHAGOGASTRODUODENOSCOPY (EGD) WITH PROPOFOL (N/A Throat)  Anesthesia Type: General Level of consciousness: awake Pain management: pain level controlled Vital Signs Assessment: post-procedure vital signs reviewed and stable Respiratory status: spontaneous breathing Cardiovascular status: blood pressure returned to baseline Postop Assessment: no headache Anesthetic complications: no    Beckey Downing

## 2018-02-06 NOTE — Transfer of Care (Signed)
Immediate Anesthesia Transfer of Care Note  Patient: Patrick Galloway  Procedure(s) Performed: ESOPHAGOGASTRODUODENOSCOPY (EGD) WITH PROPOFOL (N/A Throat)  Patient Location: PACU  Anesthesia Type: General  Level of Consciousness: awake, alert  and patient cooperative  Airway and Oxygen Therapy: Patient Spontanous Breathing and Patient connected to supplemental oxygen  Post-op Assessment: Post-op Vital signs reviewed, Patient's Cardiovascular Status Stable, Respiratory Function Stable, Patent Airway and No signs of Nausea or vomiting  Post-op Vital Signs: Reviewed and stable  Complications: No apparent anesthesia complications

## 2018-02-06 NOTE — Op Note (Addendum)
[facility] Gastroenterology Patient Name: Xaden Kaufman Procedure Date: 02/06/2018 10:12 AM MRN: [id number] Account #: [id number] Date of Birth: [birth year] Admit Type: Outpatient Age: 57 Room: [facility] OR ROOM 01 Gender: Male Note Status: Finalized Procedure:            Upper GI endoscopy Indications:          Exclusion of gastric intestinal metaplasia without                        dysplasia Providers:            [name] MD, MD Medicines:            Propofol per Anesthesia Complications:        No immediate complications. Procedure:            Pre-Anesthesia Assessment:                       - Prior to the procedure, a History and Physical was                        performed, and patient medications and allergies were                        reviewed. The patient's tolerance of previous                        anesthesia was also reviewed. The risks and benefits of                        the procedure and the sedation options and risks were                        discussed with the patient. All questions were                        answered, and informed consent was obtained. Prior                        Anticoagulants: The patient has taken no previous                        anticoagulant or antiplatelet agents. ASA Grade                        Assessment: II - A patient with mild systemic disease.                        After reviewing the risks and benefits, the patient was                        deemed in satisfactory condition to undergo the                        procedure.                       After obtaining informed consent, the endoscope was                        passed under direct vision. Throughout the procedure,  the patient's blood pressure, pulse, and oxygen                        saturations were monitored continuously. The was                        introduced through the mouth, and advanced to the    second part of duodenum. The upper GI endoscopy was                        accomplished without difficulty. The patient tolerated                        the procedure well. Findings:      A small hiatal hernia was present.      Localized moderate inflammation characterized by erythema was found in       the prepyloric region of the stomach. Biopsies were taken with a cold       forceps for histology. Gastric mapping was done in the stomach.      The examined duodenum was normal. Impression:           - Small hiatal hernia.                       - Gastritis. Biopsied.                       - Normal examined duodenum. Recommendation:       - Discharge patient to home.                       - Resume previous diet.                       - Continue present medications.                       - Await pathology results.                       - Repeat upper endoscopy in 2 years for surveillance. Procedure Code(s):    --- Professional ---                       [id number], Esophagogastroduodenoscopy, flexible, transoral;                        with biopsy, single or multiple Diagnosis Code(s):    --- Professional ---                       K29.70, Gastritis, unspecified, without bleeding CPT copyright 2017 American Medical Association. All rights reserved. The codes documented in this report are preliminary and upon coder review may  be revised to meet current compliance requirements. [name] MD, MD 02/06/2018 10:40:58 AM This report has been signed electronically. Number of Addenda: 0 Note Initiated On: 02/06/2018 10:12 AM      [facility]

## 2018-02-06 NOTE — Anesthesia Procedure Notes (Signed)
Performed by: Jordin Vicencio, CRNA Pre-anesthesia Checklist: Patient identified, Emergency Drugs available, Suction available, Timeout performed and Patient being monitored Patient Re-evaluated:Patient Re-evaluated prior to induction Oxygen Delivery Method: Nasal cannula Placement Confirmation: positive ETCO2       

## 2018-02-06 NOTE — H&P (Signed)
[name], MD [facility] [address], [address] [place], [place] [address] [phone] Fax : [phone]  Primary Care Physician:  Marshell Garfinkel, MD Primary Gastroenterologist:  Dr. Servando Snare  Pre-Procedure History & Physical: HPI:  Patrick Galloway is a 57 y.o. male is here for an endoscopy.   Past Medical History:  Diagnosis Date  . Arthritis    hips and knees  . DM2 (diabetes mellitus, type 2) (HCC)   . HIV infection (HCC)   . Hyperlipemia   . Hypertension   . Immune deficiency disorder (HCC)    PT reports HIV  . Seizures (HCC)   . Sleep apnea    No CPAP    Past Surgical History:  Procedure Laterality Date  . COLONOSCOPY WITH PROPOFOL N/A 07/17/2017   Procedure: COLONOSCOPY WITH PROPOFOL;  Surgeon: [name], MD;  Location: [facility];  Service: Endoscopy;  Laterality: N/A;  . ESOPHAGOGASTRODUODENOSCOPY (EGD) WITH PROPOFOL N/A 05/24/2017   Procedure: ESOPHAGOGASTRODUODENOSCOPY (EGD) WITH PROPOFOL;  Surgeon: [name], MD;  Location: [facility];  Service: Endoscopy;  Laterality: N/A;  . ESOPHAGOGASTRODUODENOSCOPY (EGD) WITH PROPOFOL N/A 07/17/2017   Procedure: ESOPHAGOGASTRODUODENOSCOPY (EGD) WITH PROPOFOL;  Surgeon: [name], MD;  Location: [facility];  Service: Endoscopy;  Laterality: N/A;  . none    . POLYPECTOMY  07/17/2017   Procedure: POLYPECTOMY;  Surgeon: [name], MD;  Location: [facility];  Service: Endoscopy;;    Prior to Admission medications   Medication Sig Start Date End Date Taking? Authorizing Provider  albuterol (PROVENTIL HFA;VENTOLIN HFA) 108 (90 Base) MCG/ACT inhaler Inhale 2 puffs into the lungs every 6 (six) hours as needed for wheezing or shortness of breath. 08/04/17  Yes Dionne Bucy, MD  atorvastatin (LIPITOR) 40 MG tablet Take 1 tablet by mouth daily. 05/08/17  Yes [provider]  BIKTARVY 50-200-25 MG TABS tablet Take 1 tablet by mouth daily. 05/20/17  Yes [provider]   citalopram (CELEXA) 10 MG tablet Take 1.5 tablets (15 mg total) by mouth daily. 01/07/18  Yes Jomarie Longs, MD  fluticasone (FLONASE) 50 MCG/ACT nasal spray Place 2 sprays into both nostrils as needed for allergies or rhinitis.   Yes [provider]  lisinopril (PRINIVIL,ZESTRIL) 20 MG tablet Take 1 tablet by mouth daily. 05/08/17  Yes [provider]  metFORMIN (GLUCOPHAGE) 500 MG tablet Take 1 tablet by mouth daily. 05/08/17  Yes [provider]  Multiple Vitamins-Minerals (MULTIVITAMIN ADULTS 50+ PO) Take by mouth daily.   Yes [provider]  pantoprazole (PROTONIX) 40 MG tablet Take 1 tablet (40 mg total) by mouth 2 (two) times daily. 05/26/17  Yes Katha Hamming, MD  QUEtiapine (SEROQUEL) 50 MG tablet Take 1 tablet (50 mg total) by mouth at bedtime. 01/07/18  Yes Jomarie Longs, MD  hydrOXYzine (VISTARIL) 25 MG capsule Take 1 capsule (25 mg total) by mouth 3 (three) times daily as needed (only for severe anxiety and agitation). Patient not taking: Reported on 02/02/2018 01/07/18   Jomarie Longs, MD  sucralfate (CARAFATE) 1 GM/10ML suspension Take 10 mLs (1 g total) by mouth 4 (four) times daily -  with meals and at bedtime. Patient not taking: Reported on 01/07/2018 05/26/17   Katha Hamming, MD    Allergies as of 01/21/2018  . (No Known Allergies)    Family History  Problem Relation Age of Onset  . Heart failure Mother   . Heart failure Father     Social History   Socioeconomic History  . Marital  status: Single    Spouse name: Not on file  . Number of children: 0  . Years of education: Not on file  . Highest education level: High school graduate  Occupational History    Comment: full time  Social Needs  . Financial resource strain: Somewhat hard  . Food insecurity:    Worry: Often true    Inability: Often true  . Transportation needs:    Medical: Yes    Non-medical: Yes  Tobacco Use  . Smoking status: Current Every Day Smoker     Packs/day: 0.50    Years: 25.00    Pack years: 12.50    Types: Cigarettes  . Smokeless tobacco: Never Used  . Tobacco comment: Has cut back and trying to quit  Substance and Sexual Activity  . Alcohol use: No    Frequency: Never  . Drug use: Yes    Types: Marijuana    Comment: Last used over 4 months ago per patient report  . Sexual activity: Not Currently  Lifestyle  . Physical activity:    Days per week: 0 days    Minutes per session: 0 min  . Stress: Very much  Relationships  . Social connections:    Talks on phone: More than three times a week    Gets together: Once a week    Attends religious service: More than 4 times per year    Active member of club or organization: No    Attends meetings of clubs or organizations: Never    Relationship status: Never married  . Intimate partner violence:    Fear of current or ex partner: No    Emotionally abused: No    Physically abused: No    Forced sexual activity: No  Other Topics Concern  . Not on file  Social History Narrative  . Not on file    Review of Systems: See HPI, otherwise negative ROS  Physical Exam: BP (!) 128/93   Pulse (!) 56   Temp (!) 97.5 F (36.4 C) (Temporal)   Resp 16   Ht 6\' 4"  (1.93 m)   Wt 120.7 kg   SpO2 98%   BMI 32.38 kg/m  General:   Alert,  pleasant and cooperative in NAD Head:  Normocephalic and atraumatic. Neck:  Supple; no masses or thyromegaly. Lungs:  Clear throughout to auscultation.    Heart:  Regular rate and rhythm. Abdomen:  Soft, nontender and nondistended. Normal bowel sounds, without guarding, and without rebound.   Neurologic:  Alert and  oriented x4;  grossly normal neurologically.  Impression/Plan: [name] is here for an endoscopy to be performed for gastric intestinal metaplasia  Risks, benefits, limitations, and alternatives regarding  endoscopy have been reviewed with the patient.  Questions have been answered.  All parties agreeable.   [name],  MD  02/06/2018, 9:48 AM

## 2018-02-09 ENCOUNTER — Encounter: Payer: Self-pay | Admitting: Gastroenterology

## 2018-02-16 ENCOUNTER — Ambulatory Visit: Admit: 2018-02-16 | Discharge: 2018-02-16 | Disposition: A | Discharge status: Home / Self Care | Payer: MEDICAID

## 2018-02-16 ENCOUNTER — Emergency Department: Admit: 2018-02-16 | Discharge: 2018-02-16 | Disposition: A | Discharge status: Home / Self Care | Payer: MEDICAID

## 2018-02-16 DIAGNOSIS — R0781 Pleurodynia: Principal | ICD-10-CM

## 2018-02-18 ENCOUNTER — Ambulatory Visit: Payer: Medicaid Other | Admitting: Licensed Clinical Social Worker

## 2018-03-10 ENCOUNTER — Ambulatory Visit: Payer: Medicaid Other | Admitting: Psychiatry

## 2018-03-19 ENCOUNTER — Telehealth: Payer: Self-pay

## 2018-03-19 ENCOUNTER — Other Ambulatory Visit: Payer: Self-pay

## 2018-03-19 NOTE — Telephone Encounter (Signed)
-----   Message from Midge Miniumarren Wohl, MD sent at 02/09/2018  1:28 PM EDT ----- This patient was found to have H. pylori in his stomach biopsies.  Please make sure the patient is treated for his H. pylori with follow-up testing to make sure he has cleared the H. pylori.  Thank you

## 2018-03-19 NOTE — Telephone Encounter (Signed)
Left vm for pt to return my call. Mailed letter requesting a call back.

## 2018-03-20 ENCOUNTER — Other Ambulatory Visit: Payer: Self-pay

## 2018-03-20 MED ORDER — CLARITHROMYCIN 500 MG PO TABS: 500.0000 mg | ORAL_TABLET | Freq: Two times a day (BID) | ORAL | 0 refills | Status: DC

## 2018-03-20 MED ORDER — LANSOPRAZOLE 30 MG PO CPDR: 30.0000 mg | DELAYED_RELEASE_CAPSULE | Freq: Two times a day (BID) | ORAL | 2 refills | Status: DC

## 2018-03-20 MED ORDER — AMOXICILLIN 500 MG PO CAPS: 1000.0000 mg | ORAL_CAPSULE | Freq: Two times a day (BID) | ORAL | 0 refills | Status: DC

## 2018-03-20 NOTE — Telephone Encounter (Signed)
Pt notified of EGD results and rx's that were sent to the pharmacy.

## 2018-04-01 MED ORDER — METFORMIN 500 MG TABLET
ORAL_TABLET | 0 refills | 0 days | Status: CP
Start: 2018-04-01 — End: 2018-06-24

## 2018-04-01 MED ORDER — ATORVASTATIN 40 MG TABLET
ORAL_TABLET | 0 refills | 0 days | Status: CP
Start: 2018-04-01 — End: 2018-06-24

## 2018-04-13 ENCOUNTER — Other Ambulatory Visit: Payer: Self-pay

## 2018-04-13 ENCOUNTER — Emergency Department: Payer: Self-pay

## 2018-04-13 ENCOUNTER — Encounter: Payer: Self-pay | Admitting: Emergency Medicine

## 2018-04-13 ENCOUNTER — Emergency Department
Admission: EM | Admit: 2018-04-13 | Discharge: 2018-04-13 | Disposition: A | Discharge status: Home / Self Care | Payer: Self-pay | Attending: Emergency Medicine | Admitting: Emergency Medicine

## 2018-04-13 DIAGNOSIS — F17210 Nicotine dependence, cigarettes, uncomplicated: Secondary | ICD-10-CM | POA: Insufficient documentation

## 2018-04-13 DIAGNOSIS — E119 Type 2 diabetes mellitus without complications: Secondary | ICD-10-CM | POA: Insufficient documentation

## 2018-04-13 DIAGNOSIS — I10 Essential (primary) hypertension: Secondary | ICD-10-CM | POA: Insufficient documentation

## 2018-04-13 DIAGNOSIS — Z79899 Other long term (current) drug therapy: Secondary | ICD-10-CM | POA: Insufficient documentation

## 2018-04-13 DIAGNOSIS — M7531 Calcific tendinitis of right shoulder: Secondary | ICD-10-CM | POA: Insufficient documentation

## 2018-04-13 DIAGNOSIS — B20 Human immunodeficiency virus [HIV] disease: Secondary | ICD-10-CM | POA: Insufficient documentation

## 2018-04-13 DIAGNOSIS — Z7984 Long term (current) use of oral hypoglycemic drugs: Secondary | ICD-10-CM | POA: Insufficient documentation

## 2018-04-13 HISTORY — DX: Gastric intestinal metaplasia, unspecified: K31.A0

## 2018-04-13 HISTORY — DX: Anemia, unspecified: D64.9

## 2018-04-13 HISTORY — DX: Gastric ulcer, unspecified as acute or chronic, without hemorrhage or perforation: K25.9

## 2018-04-13 HISTORY — DX: Major depressive disorder, single episode, unspecified: F32.9

## 2018-04-13 HISTORY — DX: Other diseases of stomach and duodenum: K31.89

## 2018-04-13 MED ORDER — PREDNISONE 10 MG PO TABS: ORAL_TABLET | ORAL | 0 refills | Status: AC

## 2018-04-13 MED ORDER — DEXAMETHASONE SODIUM PHOSPHATE 10 MG/ML IJ SOLN
10.0000 mg | Freq: Once | INTRAMUSCULAR | Status: AC
  Administered 2018-04-13: 10 mg via INTRAMUSCULAR
  Filled 2018-04-13: qty 1

## 2018-04-13 MED ORDER — PREDNISONE 10 MG PO TABS: ORAL_TABLET | ORAL | 0 refills | Status: DC

## 2018-04-13 NOTE — ED Provider Notes (Addendum)
[facility] Emergency Department Provider Note  ____________________________________________   None    (approximate)  I have reviewed the triage vital signs and the nursing notes.   HISTORY  Chief Complaint Arm Pain  HPI Patrick Galloway is a 57 y.o. male presents to the ED with complaint of right arm pain that began last evening while at work.  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  He states that he had to leave work due to pain.  He took Tylenol at 10 PM without any relief of his pain.  He states that he woke during the night with increased pain especially when he is trying to lie on it.  Range of motion of his arm increases his pain.  He rates his pain as an 8 out of 10.  Past Medical History:  Diagnosis Date  . Anemia   . Arthritis    hips and knees  . DM2 (diabetes mellitus, type 2) (HCC)   . Gastric ulcer   . HIV infection (HCC)   . Hyperlipemia   . Hypertension   . Immune deficiency disorder (HCC)    PT reports HIV  . Intestinal metaplasia of gastric cardia   . Major depressive disorder   . Seizures (HCC)   . Sleep apnea    No CPAP    Patient Active Problem List   Diagnosis Date Noted  . Intestinal metaplasia of gastric mucosa   . Anemia 07/23/2017  . MDD (major depressive disorder), recurrent episode, moderate (HCC) 07/21/2017  . Acute gastrointestinal ulcer with hemorrhage   . History of colonic polyps   . Rectal polyp   . HIV (human immunodeficiency virus infection) (HCC) 07/11/2017  . Acute gastric ulcer with hemorrhage   . Melena   . Upper GI bleed 05/23/2017  . HIV test positive (HCC) 04/30/2017  . Mixed hyperlipidemia 01/24/2016  . Hypertension associated with diabetes (HCC) 01/24/2016  . Diabetes mellitus type 2 in obese (HCC) 01/24/2016  . Other chest pain 04/18/2015  . URI, acute 10/07/2014  . Perirectal cyst 09/30/2014  . Bone spur 09/30/2014  . Hyperthyroidism 09/07/2014  . Tobacco dependence  07/08/2014  . 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 Pterygium 04/12/2011  . Obesity 10/19/2010  . Primary localized osteoarthrosis, lower leg 09/26/2010    Past Surgical History:  Procedure Laterality Date  . ABDOMINAL SURGERY    . COLONOSCOPY WITH PROPOFOL N/A 07/17/2017   Procedure: COLONOSCOPY WITH PROPOFOL;  Surgeon: Midge MiniumWohl, Darren, MD;  [facility];  Service: Endoscopy;  Laterality: N/A;  . ESOPHAGOGASTRODUODENOSCOPY (EGD) WITH PROPOFOL N/A 05/24/2017   Procedure: ESOPHAGOGASTRODUODENOSCOPY (EGD) WITH PROPOFOL;  Surgeon: Midge MiniumWohl, Darren, MD;  Location: [facility];  Service: Endoscopy;  Laterality: N/A;  . ESOPHAGOGASTRODUODENOSCOPY (EGD) WITH PROPOFOL N/A 07/17/2017   Procedure: ESOPHAGOGASTRODUODENOSCOPY (EGD) WITH PROPOFOL;  Surgeon: Midge MiniumWohl, Darren, MD;  [facility];  Service: Endoscopy;  Laterality: N/A;  . ESOPHAGOGASTRODUODENOSCOPY (EGD) WITH PROPOFOL N/A 02/06/2018   Procedure: ESOPHAGOGASTRODUODENOSCOPY (EGD) WITH PROPOFOL;  Surgeon: Midge MiniumWohl, Darren, MD;  [facility];  Service: Endoscopy;  Laterality: N/A;  Diabetic - oral meds Cannot arrive before 8:30 AM  . none    . POLYPECTOMY  07/17/2017   Procedure: POLYPECTOMY;  Surgeon: Midge MiniumWohl, Darren, MD;  [facility];  Service: Endoscopy;;    Prior to Admission medications   Medication Sig Start Date End Date Taking? Authorizing Provider  albuterol (PROVENTIL HFA;VENTOLIN HFA) 108 (90 Base) MCG/ACT inhaler Inhale 2 puffs into  the lungs every 6 (six) hours as needed for wheezing or shortness of breath. 08/04/17  Yes Dionne Bucy, MD  atorvastatin (LIPITOR) 40 MG tablet Take 1 tablet by mouth daily. 05/08/17  Yes [provider]  BIKTARVY 50-200-25 MG TABS tablet Take 1 tablet by mouth daily. 05/20/17  Yes [provider]  citalopram (CELEXA) 10 MG tablet Take 1.5 tablets (15 mg total) by mouth daily. 01/07/18  Yes Jomarie Longs, MD  fluticasone (FLONASE) 50  MCG/ACT nasal spray Place 2 sprays into both nostrils as needed for allergies or rhinitis.   Yes [provider]  hydrOXYzine (VISTARIL) 25 MG capsule Take 1 capsule (25 mg total) by mouth 3 (three) times daily as needed (only for severe anxiety and agitation). 01/07/18  Yes Jomarie Longs, MD  lansoprazole (PREVACID) 30 MG capsule Take 1 capsule (30 mg total) by mouth 2 (two) times daily before a meal. 03/20/18  Yes Wohl, Darren, MD  lisinopril (PRINIVIL,ZESTRIL) 20 MG tablet Take 1 tablet by mouth daily. 05/08/17  Yes [provider]  metFORMIN (GLUCOPHAGE) 500 MG tablet Take 1 tablet by mouth daily. 05/08/17  Yes [provider]  Multiple Vitamins-Minerals (MULTIVITAMIN ADULTS 50+ PO) Take by mouth daily.   Yes [provider]  pantoprazole (PROTONIX) 40 MG tablet Take 1 tablet (40 mg total) by mouth 2 (two) times daily. 05/26/17  Yes Katha Hamming, MD  QUEtiapine (SEROQUEL) 50 MG tablet Take 1 tablet (50 mg total) by mouth at bedtime. 01/07/18  Yes Jomarie Longs, MD  sucralfate (CARAFATE) 1 GM/10ML suspension Take 10 mLs (1 g total) by mouth 4 (four) times daily -  with meals and at bedtime. 05/26/17  Yes Katha Hamming, MD  predniSONE (DELTASONE) 10 MG tablet Take 3 tabs x 2 days, then 2 tabs x 2 days, 1 tab x 2 days 04/13/18   Tommi Rumps, PA-C    Allergies Patient has no known allergies.  Family History  Problem Relation Age of Onset  . Heart failure Mother   . Heart failure Father     Social History Social History   Tobacco Use  . Smoking status: Current Every Day Smoker    Packs/day: 0.50    Years: 25.00    Pack years: 12.50    Types: Cigarettes  . Smokeless tobacco: Never Used  . Tobacco comment: Has cut back and trying to quit  Substance Use Topics  . Alcohol use: No    Frequency: Never  . Drug use: Not Currently    Types: Marijuana    Comment: Last used over 4 months ago per patient report    Review of  Systems Constitutional: No fever/chills Eyes: No visual changes. ENT: No sore throat. Cardiovascular: Denies chest pain. Respiratory: Denies shortness of breath. Gastrointestinal: No abdominal pain.  No nausea, no vomiting.  Musculoskeletal: Positive right shoulder pain. Skin: Negative for rash. Neurological: Negative for headaches, focal weakness or numbness. ___________________________________________   PHYSICAL EXAM:  VITAL SIGNS: ED Triage Vitals [04/13/18 0547]  Enc Vitals Group     BP 139/89     Pulse Rate (!) 58     Resp 18     Temp 98 F (36.7 C)     Temp Source Oral     SpO2 98 %     Weight 253 lb (114.8 kg)     Height 6' 2.5" (1.892 m)     Head Circumference      Peak Flow      Pain Score 8  Pain Loc      Pain Edu?      Excl. in GC?    Constitutional: Alert and oriented. Well appearing and in no acute distress. Eyes: Conjunctivae are normal. PERRL. EOMI. Head: Atraumatic. Neck: No stridor.   Cardiovascular: Normal rate, regular rhythm. Grossly normal heart sounds.  Good peripheral circulation. Respiratory: Normal respiratory effort.  No retractions. Lungs CTAB. Gastrointestinal: Soft and nontender. No distention.  Musculoskeletal: On examination of the right shoulder there is moderate tenderness on palpation of the deltoid area and range of motion is restricted in all 4 planes secondary to discomfort.  There is no erythema or ecchymosis to suggest injury.  No soft tissue swelling is present.  Minimal crepitus is appreciated.  Good muscle strength is noted bilaterally. Neurologic:  Normal speech and language. No gross focal neurologic deficits are appreciated. No gait instability. Skin:  Skin is warm, dry and intact. No rash noted. Psychiatric: Mood and affect are normal. Speech and behavior are normal.  ____________________________________________   LABS (all labs ordered are listed, but only abnormal results are displayed)  Labs Reviewed - No data to  display  RADIOLOGY  Official radiology report(s): Dg Shoulder Right  Result Date: 04/13/2018 CLINICAL DATA:  Pain. EXAM: RIGHT SHOULDER - 2+ VIEW COMPARISON:  None. FINDINGS: Oblique, Y scapular, and axillary images were obtained. There is no demonstrable fracture or dislocation. There is mild generalized osteoarthritic change. There is calcification slightly superior to the greater tuberosity on the right, likely in the supraspinatus tendon region. Visualized right lung clear. IMPRESSION: Mild generalized osteoarthritic change. Probable calcific tendinosis laterally. No fracture or dislocation. Electronically Signed   By: Bretta Bang III M.D.   On: 04/13/2018 07:52   ____________________________________________   PROCEDURES  Procedure(s) performed: None  Procedures  Critical Care performed: No  ____________________________________________   INITIAL IMPRESSION / ASSESSMENT AND PLAN / ED COURSE  As part of my medical decision making, I reviewed the following data within the electronic MEDICAL RECORD NUMBER Notes from prior ED visits and [place] Controlled Substance Database  Patient presents to the ED with complaint of right shoulder pain that began yesterday and was worse during the night when he rolled over on his shoulder.  Patient denies any previous injury to his shoulder.  Physical exam is positive for crepitus and suggestive of some osteoarthritis.  X-ray showed calcific tendinosis of the right shoulder along with degenerative changes.  Patient was given Decadron 10 mg IM and a tapering dose of prednisone.  He is encouraged to follow-up with his PCP if any continued problems.  ____________________________________________   FINAL CLINICAL IMPRESSION(S) / ED DIAGNOSES  Final diagnoses:  Calcific tendonitis of right shoulder     ED Discharge Orders         Ordered    predniSONE (DELTASONE) 10 MG tablet  Status:  Discontinued     04/13/18 0819    predniSONE (DELTASONE) 10 MG  tablet     04/13/18 1610           Note:  This document was prepared using Dragon voice recognition software and may include unintentional dictation errors.    Tommi Rumps, PA-C 04/13/18 1128    Tommi Rumps, PA-C 04/13/18 1129    Minna Antis, MD 04/13/18 [phone]

## 2018-04-13 NOTE — ED Triage Notes (Signed)
Pt says he went to bed feeling fine and woke with right shoulder pain; pt says he couldn't lay back on the right side after he woke; pain with any movement or trying to lift arm; denies injury and fall;

## 2018-04-13 NOTE — Discharge Instructions (Signed)
Follow-up with your primary care provider if any continued problems.  You may use ice or heat to your shoulder as needed for discomfort.  And taking medication only as directed.

## 2018-04-13 NOTE — ED Notes (Signed)
See triage note  Presents with pain to right shoulder  Pain is mainly at joint area   Denies any specific injury   Having increased pain with movement   Good pulses

## 2018-05-11 MED ORDER — BICTEGRAVIR 50 MG-EMTRICITABINE 200 MG-TENOFOVIR ALAFENAM 25 MG TABLET
ORAL_TABLET | Freq: Every day | ORAL | 0 refills | 0 days | Status: CP
Start: 2018-05-11 — End: 2018-05-19

## 2018-05-19 ENCOUNTER — Ambulatory Visit: Admit: 2018-05-19 | Discharge: 2018-05-20 | Payer: MEDICAID | Attending: Specialist | Primary: Specialist

## 2018-05-19 DIAGNOSIS — I10 Essential (primary) hypertension: Secondary | ICD-10-CM

## 2018-05-19 DIAGNOSIS — K274 Chronic or unspecified peptic ulcer, site unspecified, with hemorrhage: Secondary | ICD-10-CM

## 2018-05-19 DIAGNOSIS — H9192 Unspecified hearing loss, left ear: Secondary | ICD-10-CM

## 2018-05-19 DIAGNOSIS — B20 Human immunodeficiency virus [HIV] disease: Secondary | ICD-10-CM

## 2018-05-19 DIAGNOSIS — E1159 Type 2 diabetes mellitus with other circulatory complications: Principal | ICD-10-CM

## 2018-05-19 DIAGNOSIS — F17200 Nicotine dependence, unspecified, uncomplicated: Secondary | ICD-10-CM

## 2018-05-19 MED ORDER — BICTEGRAVIR 50 MG-EMTRICITABINE 200 MG-TENOFOVIR ALAFENAM 25 MG TABLET
ORAL_TABLET | Freq: Every day | ORAL | 0 refills | 0 days | Status: CP
Start: 2018-05-19 — End: 2018-07-20

## 2018-06-06 ENCOUNTER — Ambulatory Visit: Admit: 2018-06-06 | Discharge: 2018-06-07 | Payer: MEDICAID

## 2018-06-06 DIAGNOSIS — F17200 Nicotine dependence, unspecified, uncomplicated: Principal | ICD-10-CM

## 2018-06-12 ENCOUNTER — Encounter: Payer: Self-pay | Admitting: Emergency Medicine

## 2018-06-12 ENCOUNTER — Emergency Department
Admission: EM | Admit: 2018-06-12 | Discharge: 2018-06-12 | Disposition: A | Discharge status: Home / Self Care | Payer: Medicaid Other | Attending: Emergency Medicine | Admitting: Emergency Medicine

## 2018-06-12 DIAGNOSIS — Z7984 Long term (current) use of oral hypoglycemic drugs: Secondary | ICD-10-CM | POA: Insufficient documentation

## 2018-06-12 DIAGNOSIS — K047 Periapical abscess without sinus: Secondary | ICD-10-CM | POA: Insufficient documentation

## 2018-06-12 DIAGNOSIS — Z79899 Other long term (current) drug therapy: Secondary | ICD-10-CM | POA: Insufficient documentation

## 2018-06-12 DIAGNOSIS — E119 Type 2 diabetes mellitus without complications: Secondary | ICD-10-CM | POA: Insufficient documentation

## 2018-06-12 DIAGNOSIS — B20 Human immunodeficiency virus [HIV] disease: Secondary | ICD-10-CM | POA: Insufficient documentation

## 2018-06-12 DIAGNOSIS — I10 Essential (primary) hypertension: Secondary | ICD-10-CM | POA: Insufficient documentation

## 2018-06-12 DIAGNOSIS — F17210 Nicotine dependence, cigarettes, uncomplicated: Secondary | ICD-10-CM | POA: Insufficient documentation

## 2018-06-12 MED ORDER — LIDOCAINE VISCOUS HCL 2 % MT SOLN: 10.0000 mL | OROMUCOSAL | 0 refills | Status: AC | PRN

## 2018-06-12 MED ORDER — AMOXICILLIN 500 MG PO CAPS: 500.0000 mg | ORAL_CAPSULE | Freq: Three times a day (TID) | ORAL | 0 refills | Status: AC

## 2018-06-12 NOTE — Discharge Instructions (Signed)
OPTIONS FOR DENTAL FOLLOW UP CARE ° °[place] Department of Health and Human Services - Local Safety Net Dental Clinics °http://www.ncdhhs.gov/dph/oralhealth/services/safetynetclinics.htm °  °[facility] (336-562-3123) ° °[facility] [place] (919-933-9087) ° °[facility] (919-663-1744 ext [phone]) ° °[facility] (336-570-6415) ° °[facility] ([phone]) °This clinic caters to the indigent population and is on a lottery system. °Location: °[facility], [address], [place] °Clinic Hours: °Wednesdays from 6pm - 9pm, patients seen by a lottery system. °For dates, call or go to www.med.[facility].edu/[facility]/patients/Dental-[facility] °Services: °Cleanings, fillings and simple extractions. °Payment Options: °DENTAL WORK IS FREE OF CHARGE. Bring proof of income or support. °Best way to get seen: °Arrive at 5:15 pm - this is a lottery, NOT first come/first serve, so arriving earlier will not increase your chances of being seen. °  °  °[facility] °919-537-3737 °Select option 1 for emergencies °  °Location: °[facility], [address], [place] °Clinic Hours: °No walk-ins accepted - call the day before to schedule an appointment. °Check in times are 9:30 am and 1:30 pm. °Services: °Simple extractions, temporary fillings, pulpectomy/pulp debridement, uncomplicated abscess drainage. °Payment Options: °PAYMENT IS DUE AT THE TIME OF SERVICE.  Fee is usually $100-200, additional surgical procedures (e.g. abscess drainage) may be extra. °Cash, checks, Visa/MasterCard accepted.  Can file Medicaid if patient is covered for dental - patient should call case worker to check. °No discount for [facility] patients. °Best way to get seen: °MUST call the day before and get onto the schedule. Can usually be seen the next 1-2 days. No walk-ins accepted. °  °  °[facility] °[phone] °   °Location: °[facility], [address], [place] °Clinic Hours: °M, W, Th, F 8am or 1:30pm, Tues 9a or 1:30 - first come/first served. °Services: °Simple extractions, temporary fillings, uncomplicated abscess drainage.  You do not need to be an [place] resident. °Payment Options: °PAYMENT IS DUE AT THE TIME OF SERVICE. °Dental insurance, otherwise sliding scale - bring proof of income or support. °Depending on income and treatment needed, cost is usually $50-200. °Best way to get seen: °Arrive early as it is first come/first served. °  °  °[facility] °919-542-1641 °  °Location: °[address] °Clinic Hours: °Mon-Thu 8a-5p °Services: °Most basic dental services including extractions and fillings. °Payment Options: °PAYMENT IS DUE AT THE TIME OF SERVICE. °Sliding scale, up to 50% off - bring proof if income or support. °Medicaid with dental option accepted. °Best way to get seen: °Call to schedule an appointment, can usually be seen within 2 weeks OR they will try to see walk-ins - show up at 8a or 2p (you may have to wait). °  °  °[facility] °919-245-2435 °[place] RESIDENTS ONLY °  °Location: °[facility], [address], [place], [place] [address] °Clinic Hours: By appointment only. °Monday - Thursday 8am-5pm, Friday 8am-12pm °Services: Cleanings, fillings, extractions. °Payment Options: °PAYMENT IS DUE AT THE TIME OF SERVICE. °Cash, Visa or MasterCard. Sliding scale - $30 minimum per service. °Best way to get seen: °Come in to office, complete packet and make an appointment - need proof of income °or support monies for each household member and proof of [place] residence. °Usually takes about a month to get in. °  °  °[facility] °[phone] °  °Location: °[address]   [place] °Clinic Hours: Walk-in Urgent Care Dental Services are offered Monday-Friday  mornings only. °The numbers of emergencies accepted daily is limited to the number of °providers available. °Maximum 15 - Mondays, Wednesdays & Thursdays °Maximum 10 - Tuesdays & Fridays °Services: °You do not need to be a [place] resident to be seen for a dental emergency. °Emergencies are defined as pain, swelling, abnormal bleeding, or dental trauma. Walkins will receive x-rays if needed. °NOTE: Dental cleaning is not an emergency. °Payment Options: °PAYMENT IS DUE AT THE TIME OF SERVICE. °Minimum co-pay is $40.00 for uninsured patients. °Minimum co-pay is $3.00 for Medicaid with dental coverage. °Dental Insurance is accepted and must be presented at time of visit. °Medicare does not cover dental. °Forms of payment: Cash, credit card, checks. °Best way to get seen: °If not previously registered with the clinic, walk-in dental registration begins at 7:15 am and is on a first come/first serve basis. °If previously registered with the clinic, call to make an appointment. °  °  °[facility] °919-776-4359 °[place] RESIDENTS ONLY °  °Location: °[address], [place], [place] °Clinic Hours: °Mon-Thu 10a-2p °Services: Extractions only! °Payment Options: °FREE (donations accepted) - bring proof of income or support °Best way to get seen: °Call and schedule an appointment OR come at 8am on the 1st Monday of every month (except for holidays) when it is first come/first served. °  °  °[facility] °[phone] °  °Location: °[address], [place] °Clinic Hours: °Friday mornings °Services, Payment Options, Best way to get seen: °Call for info °

## 2018-06-12 NOTE — ED Triage Notes (Signed)
Pt called from Wr to triage, no response.

## 2018-06-12 NOTE — ED Notes (Signed)
See triage note     States he has had pain to left upper jaw   Possible abscess

## 2018-06-12 NOTE — ED Notes (Signed)
Called   No answer in lobby  

## 2018-06-12 NOTE — ED Triage Notes (Signed)
Pt reports toothache to left upper jaw for the last 2 days.

## 2018-06-12 NOTE — ED Provider Notes (Signed)
[facility] Emergency Department Provider Note  ____________________________________________  Time seen: Approximately 3:42 PM  I have reviewed the triage vital signs and the nursing notes.   HISTORY  Chief Complaint Dental Pain    HPI Patrick Galloway is a 58 y.o. male presents emergency department for evaluation of left upper dental pain for 2 days.  Patient states that he called [facility] dental clinic and they are working on getting him an appointment but was recommended to come to the emergency department first for antibiotics.  No fever, chills, swelling, drainage.  Past Medical History:  Diagnosis Date  . Anemia   . Arthritis    hips and knees  . DM2 (diabetes mellitus, type 2) (HCC)   . Gastric ulcer   . HIV infection (HCC)   . Hyperlipemia   . Hypertension   . Immune deficiency disorder (HCC)    PT reports HIV  . Intestinal metaplasia of gastric cardia   . Major depressive disorder   . Seizures (HCC)   . Sleep apnea    No CPAP    Patient Active Problem List   Diagnosis Date Noted  . Intestinal metaplasia of gastric mucosa   . Anemia 07/23/2017  . MDD (major depressive disorder), recurrent episode, moderate (HCC) 07/21/2017  . Acute gastrointestinal ulcer with hemorrhage   . History of colonic polyps   . Rectal polyp   . HIV (human immunodeficiency virus infection) (HCC) 07/11/2017  . Acute gastric ulcer with hemorrhage   . Melena   . Upper GI bleed 05/23/2017  . HIV test positive (HCC) 04/30/2017  . Mixed hyperlipidemia 01/24/2016  . Hypertension associated with diabetes (HCC) 01/24/2016  . Diabetes mellitus type 2 in obese (HCC) 01/24/2016  . Other chest pain 04/18/2015  . URI, acute 10/07/2014  . Perirectal cyst 09/30/2014  . Bone spur 09/30/2014  . Hyperthyroidism 09/07/2014  . Tobacco dependence 07/08/2014  . 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 Pterygium 04/12/2011  . Obesity 10/19/2010  . Primary localized  osteoarthrosis, lower leg 09/26/2010    Past Surgical History:  Procedure Laterality Date  . ABDOMINAL SURGERY    . COLONOSCOPY WITH PROPOFOL N/A 07/17/2017   Procedure: COLONOSCOPY WITH PROPOFOL;  Surgeon: [name], MD;  Location: [facility];  Service: Endoscopy;  Laterality: N/A;  . ESOPHAGOGASTRODUODENOSCOPY (EGD) WITH PROPOFOL N/A 05/24/2017   Procedure: ESOPHAGOGASTRODUODENOSCOPY (EGD) WITH PROPOFOL;  Surgeon: [name], MD;  Location: [facility] END[facility];  Service: Endoscopy;  Laterality: N/A;  . ESOPHAGOGASTRODUODENOSCOPY (EGD) WITH PROPOFOL N/A 07/17/2017   Procedure: ESOPHAGOGASTRODUODENOSCOPY (EGD) WITH PROPOFOL;  Surgeon: [name], MD;  Location: [facility];  Service: Endoscopy;  Laterality: N/A;  . ESOPHAGOGASTRODUODENOSCOPY (EGD) WITH PROPOFOL N/A 02/06/2018   Procedure: ESOPHAGOGASTRODUODENOSCOPY (EGD) WITH PROPOFOL;  Surgeon: [name], MD;  Location: [facility];  Service: Endoscopy;  Laterality: N/A;  Diabetic - oral meds Cannot arrive before 8:30 AM  . none    . POLYPECTOMY  07/17/2017   Procedure: POLYPECTOMY;  Surgeon: [name], MD;  Location: [facility];  Service: Endoscopy;;    Prior to Admission medications   Medication Sig Start Date End Date Taking? Authorizing Provider  albuterol (PROVENTIL HFA;VENTOLIN HFA) 108 (90 Base) MCG/ACT inhaler Inhale 2 puffs into the lungs every 6 (six) hours as needed for wheezing or shortness of breath. 08/04/17   Dionne Bucy, MD  amoxicillin (AMOXIL) 500 MG capsule Take 1 capsule (500 mg total) by mouth 3 (three) times daily. 06/12/18   Loreta Ave,  Morrie Sheldon, PA-C  atorvastatin (LIPITOR) 40 MG tablet Take 1 tablet by mouth daily. 05/08/17   [provider]  BIKTARVY 50-200-25 MG TABS tablet Take 1 tablet by mouth daily. 05/20/17   [provider]  citalopram (CELEXA) 10 MG tablet Take 1.5 tablets (15 mg total) by mouth daily. 01/07/18   Jomarie Longs, MD  fluticasone (FLONASE) 50  MCG/ACT nasal spray Place 2 sprays into both nostrils as needed for allergies or rhinitis.    [provider]  hydrOXYzine (VISTARIL) 25 MG capsule Take 1 capsule (25 mg total) by mouth 3 (three) times daily as needed (only for severe anxiety and agitation). 01/07/18   Jomarie Longs, MD  lansoprazole (PREVACID) 30 MG capsule Take 1 capsule (30 mg total) by mouth 2 (two) times daily before a meal. 03/20/18   [name], MD  lidocaine (XYLOCAINE) 2 % solution Use as directed 10 mLs in the mouth or throat as needed for mouth pain. 06/12/18   Enid Derry, PA-C  lisinopril (PRINIVIL,ZESTRIL) 20 MG tablet Take 1 tablet by mouth daily. 05/08/17   [provider]  metFORMIN (GLUCOPHAGE) 500 MG tablet Take 1 tablet by mouth daily. 05/08/17   [provider]  Multiple Vitamins-Minerals (MULTIVITAMIN ADULTS 50+ PO) Take by mouth daily.    [provider]  pantoprazole (PROTONIX) 40 MG tablet Take 1 tablet (40 mg total) by mouth 2 (two) times daily. 05/26/17   Katha Hamming, MD  predniSONE (DELTASONE) 10 MG tablet Take 3 tabs x 2 days, then 2 tabs x 2 days, 1 tab x 2 days 04/13/18   Tommi Rumps, PA-C  QUEtiapine (SEROQUEL) 50 MG tablet Take 1 tablet (50 mg total) by mouth at bedtime. 01/07/18   Jomarie Longs, MD  sucralfate (CARAFATE) 1 GM/10ML suspension Take 10 mLs (1 g total) by mouth 4 (four) times daily -  with meals and at bedtime. 05/26/17   Katha Hamming, MD    Allergies Patient has no known allergies.  Family History  Problem Relation Age of Onset  . Heart failure Mother   . Heart failure Father     Social History Social History   Tobacco Use  . Smoking status: Current Every Day Smoker    Packs/day: 0.50    Years: 25.00    Pack years: 12.50    Types: Cigarettes  . Smokeless tobacco: Never Used  . Tobacco comment: Has cut back and trying to quit  Substance Use Topics  . Alcohol use: No    Frequency: Never  . Drug use: Not Currently     Types: Marijuana    Comment: Last used over 4 months ago per patient report     Review of Systems  Constitutional: No fever/chills ENT: No upper respiratory complaints. Cardiovascular: No chest pain. Respiratory: No SOB. Gastrointestinal: No abdominal pain.  No nausea, no vomiting.  Musculoskeletal: Negative for musculoskeletal pain. Skin: Negative for rash, abrasions, lacerations, ecchymosis. Neurological: Negative for headaches, numbness or tingling   ____________________________________________   PHYSICAL EXAM:  VITAL SIGNS: ED Triage Vitals  Enc Vitals Group     BP 06/12/18 1358 123/68     Pulse Rate 06/12/18 1358 70     Resp 06/12/18 1358 18     Temp 06/12/18 1358 97.8 F (36.6 C)     Temp Source 06/12/18 1358 Oral     SpO2 06/12/18 1358 97 %     Weight 06/12/18 1355 270 lb (122.5 kg)     Height 06/12/18 1355 6\' 4"  (  1.93 m)     Head Circumference --      Peak Flow --      Pain Score 06/12/18 1355 8     Pain Loc --      Pain Edu? --      Excl. in GC? --      Constitutional: Alert and oriented. Well appearing and in no acute distress. Eyes: Conjunctivae are normal. PERRL. EOMI. Head: Atraumatic. ENT:      Ears:      Nose: No congestion/rhinnorhea.      Mouth/Throat: Mucous membranes are moist.  Poor dentition.  Multiple dental cavities with tooth decayed to the gumline.  No swelling.  No trismus.  Neck: No stridor.  Cardiovascular: Normal rate, regular rhythm.  Good peripheral circulation. Respiratory: Normal respiratory effort without tachypnea or retractions. Lungs CTAB. Good air entry to the bases with no decreased or absent breath sounds. Musculoskeletal: Full range of motion to all extremities. No gross deformities appreciated. Neurologic:  Normal speech and language. No gross focal neurologic deficits are appreciated.  Skin:  Skin is warm, dry and intact. No rash noted. Psychiatric: Mood and affect are normal. Speech and behavior are normal. Patient  exhibits appropriate insight and judgement.   ____________________________________________   LABS (all labs ordered are listed, but only abnormal results are displayed)  Labs Reviewed - No data to display ____________________________________________  EKG   ____________________________________________  RADIOLOGY   No results found.  ____________________________________________    PROCEDURES  Procedure(s) performed:    Procedures    Medications - No data to display   ____________________________________________   INITIAL IMPRESSION / ASSESSMENT AND PLAN / ED COURSE  Pertinent labs & imaging results that were available during my care of the patient were reviewed by me and considered in my medical decision making (see chart for details).  Review of the [place] CSRS was performed in accordance of the [facility] prior to dispensing any controlled drugs.     Patient's diagnosis is consistent with dental abscess.  Patient will be discharged home with prescriptions for amoxicillin. Patient is to follow up with dentist as directed.  Dental resources were given.  Patient is given ED precautions to return to the ED for any worsening or new symptoms.     ____________________________________________  FINAL CLINICAL IMPRESSION(S) / ED DIAGNOSES  Final diagnoses:  Dental abscess      NEW MEDICATIONS STARTED DURING THIS VISIT:  ED Discharge Orders         Ordered    amoxicillin (AMOXIL) 500 MG capsule  3 times daily     06/12/18 1553    lidocaine (XYLOCAINE) 2 % solution  As needed     06/12/18 1553              This chart was dictated using voice recognition software/Dragon. Despite best efforts to proofread, errors can occur which can change the meaning. Any change was purely unintentional.    Enid DerryWagner, Zackeriah Kissler, PA-C 06/12/18 1623    Jene EveryKinner, Robert, MD 06/15/18 [phone]

## 2018-06-24 MED ORDER — ATORVASTATIN 40 MG TABLET
ORAL_TABLET | 0 refills | 0 days | Status: CP
Start: 2018-06-24 — End: 2018-09-22

## 2018-06-24 MED ORDER — METFORMIN 500 MG TABLET
ORAL_TABLET | 0 refills | 0 days | Status: CP
Start: 2018-06-24 — End: 2018-11-23

## 2018-07-09 ENCOUNTER — Ambulatory Visit: Admit: 2018-07-09 | Discharge: 2018-07-10

## 2018-07-09 DIAGNOSIS — M25469 Effusion, unspecified knee: Principal | ICD-10-CM

## 2018-07-09 DIAGNOSIS — F17200 Nicotine dependence, unspecified, uncomplicated: Principal | ICD-10-CM

## 2018-07-09 DIAGNOSIS — E7800 Pure hypercholesterolemia, unspecified: Principal | ICD-10-CM

## 2018-07-09 DIAGNOSIS — E119 Type 2 diabetes mellitus without complications: Principal | ICD-10-CM

## 2018-07-09 DIAGNOSIS — G4730 Sleep apnea, unspecified: Principal | ICD-10-CM

## 2018-07-09 DIAGNOSIS — E669 Obesity, unspecified: Principal | ICD-10-CM

## 2018-07-09 DIAGNOSIS — M2550 Pain in unspecified joint: Principal | ICD-10-CM

## 2018-07-09 DIAGNOSIS — B20 Human immunodeficiency virus [HIV] disease: Principal | ICD-10-CM

## 2018-07-09 DIAGNOSIS — F339 Major depressive disorder, recurrent, unspecified: Principal | ICD-10-CM

## 2018-07-09 DIAGNOSIS — I10 Essential (primary) hypertension: Principal | ICD-10-CM

## 2018-07-09 DIAGNOSIS — M1710 Unilateral primary osteoarthritis, unspecified knee: Principal | ICD-10-CM

## 2018-07-09 DIAGNOSIS — H11009 Unspecified pterygium of unspecified eye: Principal | ICD-10-CM

## 2018-07-09 DIAGNOSIS — K056 Periodontal disease, unspecified: Principal | ICD-10-CM

## 2018-07-09 DIAGNOSIS — K069 Disorder of gingiva and edentulous alveolar ridge, unspecified: Principal | ICD-10-CM

## 2018-07-09 DIAGNOSIS — R0683 Snoring: Principal | ICD-10-CM

## 2018-07-09 MED ORDER — CITALOPRAM 20 MG TABLET
ORAL_TABLET | Freq: Every day | ORAL | 0 refills | 0.00000 days | Status: CP
Start: 2018-07-09 — End: 2018-09-30

## 2018-07-09 MED ORDER — TRAZODONE 50 MG TABLET
ORAL_TABLET | Freq: Every evening | ORAL | 2 refills | 0.00000 days | Status: CP
Start: 2018-07-09 — End: 2018-10-01

## 2018-07-20 DIAGNOSIS — B20 Human immunodeficiency virus [HIV] disease: Principal | ICD-10-CM

## 2018-07-21 MED ORDER — BIKTARVY 50 MG-200 MG-25 MG TABLET
ORAL_TABLET | Freq: Every day | ORAL | 0 refills | 0 days | Status: CP
Start: 2018-07-21 — End: 2018-08-18

## 2018-08-18 MED ORDER — BIKTARVY 50 MG-200 MG-25 MG TABLET
ORAL_TABLET | Freq: Every day | ORAL | 0 refills | 0 days | Status: CP
Start: 2018-08-18 — End: 2018-10-01

## 2018-08-21 ENCOUNTER — Telehealth: Payer: Self-pay | Admitting: Gastroenterology

## 2018-08-21 NOTE — Telephone Encounter (Signed)
Received Fax from [facility] for lansoprazole (PREVACID) 30 MG capsule for qty:60 last filled 05-27-18. Put on your desk.

## 2018-08-21 NOTE — Telephone Encounter (Signed)
Left vm for pt to return my call in regards to refill request on Lansoprazole (prevacid) pt was taking this due to dx of H pylori. Pt may not need.

## 2018-08-24 ENCOUNTER — Other Ambulatory Visit: Payer: Self-pay

## 2018-08-24 MED ORDER — LANSOPRAZOLE 30 MG PO CPDR: 30.0000 mg | DELAYED_RELEASE_CAPSULE | Freq: Two times a day (BID) | ORAL | 5 refills | Status: AC

## 2018-08-24 NOTE — Telephone Encounter (Signed)
[facility] faxed in second refill request for lansoprazole (PREVACID) 30 MG capsule. I've placed on your desk.

## 2018-08-24 NOTE — Telephone Encounter (Signed)
Called pt again today to confirm refill request for his Lansoprazole and pharmacy location. Pt confirmed the medication was need and location to send.

## 2018-09-15 ENCOUNTER — Ambulatory Visit: Admit: 2018-09-15 | Discharge: 2018-09-16

## 2018-09-15 DIAGNOSIS — K029 Dental caries, unspecified: Secondary | ICD-10-CM

## 2018-09-15 DIAGNOSIS — K05219 Aggressive periodontitis, localized, unspecified severity: Secondary | ICD-10-CM

## 2018-09-15 DIAGNOSIS — K0889 Other specified disorders of teeth and supporting structures: Principal | ICD-10-CM

## 2018-09-15 DIAGNOSIS — K083 Retained dental root: Secondary | ICD-10-CM

## 2018-09-15 DIAGNOSIS — K036 Deposits [accretions] on teeth: Secondary | ICD-10-CM

## 2018-09-15 DIAGNOSIS — Z0120 Encounter for dental examination and cleaning without abnormal findings: Secondary | ICD-10-CM

## 2018-09-15 MED ORDER — HYDROCODONE 5 MG-ACETAMINOPHEN 325 MG TABLET
ORAL_TABLET | Freq: Four times a day (QID) | ORAL | 0 refills | 0 days | Status: CP | PRN
Start: 2018-09-15 — End: 2018-10-01
  Filled 2018-09-15: qty 10, 3d supply, fill #0

## 2018-09-15 MED FILL — HYDROCODONE 5 MG-ACETAMINOPHEN 325 MG TABLET: 3 days supply | Qty: 10 | Fill #0 | Status: AC

## 2018-09-22 MED ORDER — ATORVASTATIN 40 MG TABLET
ORAL_TABLET | 0 refills | 0 days | Status: CP
Start: 2018-09-22 — End: ?

## 2018-09-30 MED ORDER — CITALOPRAM 20 MG TABLET
ORAL_TABLET | Freq: Every day | ORAL | 0 refills | 0.00000 days | Status: CP
Start: 2018-09-30 — End: 2018-11-03

## 2018-10-01 ENCOUNTER — Ambulatory Visit: Admit: 2018-10-01 | Discharge: 2018-10-02 | Payer: MEDICAID | Attending: Medical | Primary: Medical

## 2018-10-01 DIAGNOSIS — J309 Allergic rhinitis, unspecified: Secondary | ICD-10-CM

## 2018-10-01 DIAGNOSIS — F17200 Nicotine dependence, unspecified, uncomplicated: Secondary | ICD-10-CM

## 2018-10-01 DIAGNOSIS — E669 Obesity, unspecified: Secondary | ICD-10-CM

## 2018-10-01 DIAGNOSIS — E0590 Thyrotoxicosis, unspecified without thyrotoxic crisis or storm: Secondary | ICD-10-CM

## 2018-10-01 DIAGNOSIS — E1169 Type 2 diabetes mellitus with other specified complication: Secondary | ICD-10-CM

## 2018-10-01 DIAGNOSIS — E119 Type 2 diabetes mellitus without complications: Secondary | ICD-10-CM

## 2018-10-01 DIAGNOSIS — I10 Essential (primary) hypertension: Secondary | ICD-10-CM

## 2018-10-01 DIAGNOSIS — B20 Human immunodeficiency virus [HIV] disease: Secondary | ICD-10-CM

## 2018-10-01 DIAGNOSIS — E1159 Type 2 diabetes mellitus with other circulatory complications: Secondary | ICD-10-CM

## 2018-10-01 DIAGNOSIS — E782 Mixed hyperlipidemia: Secondary | ICD-10-CM

## 2018-10-01 DIAGNOSIS — F339 Major depressive disorder, recurrent, unspecified: Principal | ICD-10-CM

## 2018-10-01 MED ORDER — QUETIAPINE 25 MG TABLET
ORAL_TABLET | Freq: Every evening | ORAL | 0 refills | 0.00000 days | Status: CP
Start: 2018-10-01 — End: 2018-10-20

## 2018-10-01 MED ORDER — ALBUTEROL SULFATE HFA 90 MCG/ACTUATION AEROSOL INHALER
RESPIRATORY_TRACT | 3 refills | 0.00000 days | Status: CP | PRN
Start: 2018-10-01 — End: ?

## 2018-10-01 MED ORDER — BIKTARVY 50 MG-200 MG-25 MG TABLET
ORAL_TABLET | Freq: Every day | ORAL | 0 refills | 0.00000 days | Status: CP
Start: 2018-10-01 — End: 2018-11-23

## 2018-10-01 MED ORDER — FLUTICASONE PROPIONATE 50 MCG/ACTUATION NASAL SPRAY,SUSPENSION
Freq: Every day | NASAL | 1 refills | 0 days | Status: CP
Start: 2018-10-01 — End: ?

## 2018-10-01 MED ORDER — NICOTINE 21 MG/24 HR DAILY TRANSDERMAL PATCH
MEDICATED_PATCH | TRANSDERMAL | 0 refills | 0 days | Status: CP
Start: 2018-10-01 — End: ?

## 2018-10-08 DIAGNOSIS — F339 Major depressive disorder, recurrent, unspecified: Principal | ICD-10-CM

## 2018-10-20 MED ORDER — QUETIAPINE 25 MG TABLET
ORAL_TABLET | Freq: Every evening | ORAL | 5 refills | 0 days | Status: CP
Start: 2018-10-20 — End: ?

## 2018-11-03 ENCOUNTER — Ambulatory Visit: Admit: 2018-11-03 | Discharge: 2018-11-04 | Payer: MEDICAID | Attending: Medical | Primary: Medical

## 2018-11-03 DIAGNOSIS — E119 Type 2 diabetes mellitus without complications: Secondary | ICD-10-CM

## 2018-11-03 DIAGNOSIS — F339 Major depressive disorder, recurrent, unspecified: Secondary | ICD-10-CM

## 2018-11-03 DIAGNOSIS — F17200 Nicotine dependence, unspecified, uncomplicated: Secondary | ICD-10-CM

## 2018-11-03 DIAGNOSIS — Z23 Encounter for immunization: Secondary | ICD-10-CM

## 2018-11-03 DIAGNOSIS — M25562 Pain in left knee: Secondary | ICD-10-CM

## 2018-11-03 DIAGNOSIS — R3915 Urgency of urination: Secondary | ICD-10-CM

## 2018-11-03 DIAGNOSIS — A630 Anogenital (venereal) warts: Secondary | ICD-10-CM

## 2018-11-03 DIAGNOSIS — E0590 Thyrotoxicosis, unspecified without thyrotoxic crisis or storm: Secondary | ICD-10-CM

## 2018-11-03 DIAGNOSIS — B20 Human immunodeficiency virus [HIV] disease: Secondary | ICD-10-CM

## 2018-11-03 DIAGNOSIS — G8929 Other chronic pain: Secondary | ICD-10-CM

## 2018-11-03 DIAGNOSIS — I10 Essential (primary) hypertension: Principal | ICD-10-CM

## 2018-11-03 MED ORDER — CITALOPRAM 20 MG TABLET
ORAL_TABLET | Freq: Every day | ORAL | 0 refills | 0.00000 days | Status: CP
Start: 2018-11-03 — End: ?

## 2018-11-03 MED ORDER — LISINOPRIL 20 MG TABLET
ORAL_TABLET | Freq: Every day | ORAL | 3 refills | 0 days | Status: CP
Start: 2018-11-03 — End: ?

## 2018-11-19 ENCOUNTER — Ambulatory Visit: Admit: 2018-11-19 | Discharge: 2018-11-20 | Payer: MEDICAID | Attending: Family | Primary: Family

## 2018-11-19 DIAGNOSIS — Z20828 Contact with and (suspected) exposure to other viral communicable diseases: Principal | ICD-10-CM

## 2018-11-23 ENCOUNTER — Ambulatory Visit: Admit: 2018-11-23 | Discharge: 2018-11-24 | Payer: MEDICAID

## 2018-11-23 ENCOUNTER — Ambulatory Visit
Admit: 2018-11-23 | Discharge: 2018-11-24 | Payer: MEDICAID | Attending: Infectious Disease | Primary: Infectious Disease

## 2018-11-23 DIAGNOSIS — A630 Anogenital (venereal) warts: Principal | ICD-10-CM

## 2018-11-23 DIAGNOSIS — K625 Hemorrhage of anus and rectum: Secondary | ICD-10-CM

## 2018-11-23 DIAGNOSIS — B20 Human immunodeficiency virus [HIV] disease: Principal | ICD-10-CM

## 2018-11-23 DIAGNOSIS — E119 Type 2 diabetes mellitus without complications: Secondary | ICD-10-CM

## 2018-11-23 MED ORDER — METFORMIN 500 MG TABLET
ORAL_TABLET | Freq: Every day | ORAL | 0 refills | 45.00000 days | Status: CP
Start: 2018-11-23 — End: ?

## 2018-11-23 MED ORDER — BIKTARVY 50 MG-200 MG-25 MG TABLET
ORAL_TABLET | Freq: Every day | ORAL | 5 refills | 30 days | Status: CP
Start: 2018-11-23 — End: ?

## 2018-12-30 ENCOUNTER — Ambulatory Visit: Admit: 2018-12-30 | Discharge: 2018-12-31

## 2018-12-30 DIAGNOSIS — H11003 Unspecified pterygium of eye, bilateral: Principal | ICD-10-CM

## 2019-01-04 ENCOUNTER — Ambulatory Visit: Admit: 2019-01-04 | Discharge: 2019-01-05 | Payer: MEDICAID | Attending: Medical | Primary: Medical

## 2019-01-04 DIAGNOSIS — Z029 Encounter for administrative examinations, unspecified: Principal | ICD-10-CM

## 2019-01-18 DIAGNOSIS — F339 Major depressive disorder, recurrent, unspecified: Secondary | ICD-10-CM

## 2019-01-18 DIAGNOSIS — E782 Mixed hyperlipidemia: Secondary | ICD-10-CM

## 2019-01-18 MED ORDER — CITALOPRAM 20 MG TABLET
ORAL_TABLET | 1 refills | 0 days | Status: CP
Start: 2019-01-18 — End: ?

## 2019-01-18 MED ORDER — ATORVASTATIN 40 MG TABLET
ORAL_TABLET | 0 refills | 0 days | Status: CP
Start: 2019-01-18 — End: ?

## 2019-01-19 DIAGNOSIS — E119 Type 2 diabetes mellitus without complications: Secondary | ICD-10-CM

## 2019-01-25 DIAGNOSIS — E119 Type 2 diabetes mellitus without complications: Secondary | ICD-10-CM

## 2019-01-26 MED ORDER — METFORMIN 500 MG TABLET
ORAL_TABLET | Freq: Every day | ORAL | 1 refills | 90 days | Status: CP
Start: 2019-01-26 — End: ?

## 2019-01-28 ENCOUNTER — Institutional Professional Consult (permissible substitution): Admit: 2019-01-28 | Discharge: 2019-01-29 | Payer: MEDICAID | Attending: Medical | Primary: Medical

## 2019-01-28 DIAGNOSIS — F339 Major depressive disorder, recurrent, unspecified: Secondary | ICD-10-CM

## 2019-01-28 DIAGNOSIS — M1712 Unilateral primary osteoarthritis, left knee: Secondary | ICD-10-CM

## 2019-01-28 DIAGNOSIS — Z029 Encounter for administrative examinations, unspecified: Secondary | ICD-10-CM

## 2019-04-06 ENCOUNTER — Other Ambulatory Visit: Payer: Self-pay

## 2019-05-12 DIAGNOSIS — E782 Mixed hyperlipidemia: Principal | ICD-10-CM

## 2019-05-12 MED ORDER — ATORVASTATIN 40 MG TABLET: 40 mg | tablet | Freq: Every day | 0 refills | 90 days | Status: AC

## 2019-05-12 MED ORDER — ATORVASTATIN 40 MG TABLET
ORAL_TABLET | Freq: Every day | ORAL | 0 refills | 0.00000 days | Status: CP
Start: 2019-05-12 — End: ?

## 2019-05-17 ENCOUNTER — Ambulatory Visit: Admit: 2019-05-17 | Discharge: 2019-05-18 | Attending: Medical | Primary: Medical

## 2019-05-17 DIAGNOSIS — F339 Major depressive disorder, recurrent, unspecified: Principal | ICD-10-CM

## 2019-05-17 DIAGNOSIS — F17200 Nicotine dependence, unspecified, uncomplicated: Principal | ICD-10-CM

## 2019-05-17 DIAGNOSIS — E1169 Type 2 diabetes mellitus with other specified complication: Principal | ICD-10-CM

## 2019-05-17 DIAGNOSIS — M25551 Pain in right hip: Principal | ICD-10-CM

## 2019-05-17 DIAGNOSIS — K219 Gastro-esophageal reflux disease without esophagitis: Principal | ICD-10-CM

## 2019-05-17 DIAGNOSIS — J309 Allergic rhinitis, unspecified: Principal | ICD-10-CM

## 2019-05-17 DIAGNOSIS — E669 Obesity, unspecified: Secondary | ICD-10-CM

## 2019-05-17 DIAGNOSIS — Z122 Encounter for screening for malignant neoplasm of respiratory organs: Principal | ICD-10-CM

## 2019-05-17 DIAGNOSIS — E782 Mixed hyperlipidemia: Principal | ICD-10-CM

## 2019-05-17 DIAGNOSIS — I10 Essential (primary) hypertension: Principal | ICD-10-CM

## 2019-05-17 DIAGNOSIS — E119 Type 2 diabetes mellitus without complications: Principal | ICD-10-CM

## 2019-05-17 DIAGNOSIS — E0590 Thyrotoxicosis, unspecified without thyrotoxic crisis or storm: Principal | ICD-10-CM

## 2019-05-17 MED ORDER — CITALOPRAM 20 MG TABLET
ORAL_TABLET | Freq: Every day | ORAL | 3 refills | 90 days | Status: CP
Start: 2019-05-17 — End: ?

## 2019-05-17 MED ORDER — ALBUTEROL SULFATE HFA 90 MCG/ACTUATION AEROSOL INHALER
RESPIRATORY_TRACT | 3 refills | 0.00000 days | Status: CP | PRN
Start: 2019-05-17 — End: ?

## 2019-05-17 MED ORDER — ATORVASTATIN 40 MG TABLET
ORAL_TABLET | Freq: Every day | ORAL | 3 refills | 90 days | Status: CP
Start: 2019-05-17 — End: ?

## 2019-05-17 MED ORDER — METFORMIN 500 MG TABLET
ORAL_TABLET | Freq: Every day | ORAL | 3 refills | 90 days | Status: CP
Start: 2019-05-17 — End: ?

## 2019-05-17 MED ORDER — FLUTICASONE PROPIONATE 50 MCG/ACTUATION NASAL SPRAY,SUSPENSION
Freq: Every day | NASAL | 11 refills | 0 days | Status: CP
Start: 2019-05-17 — End: ?

## 2019-05-17 MED ORDER — LANSOPRAZOLE 30 MG CAPSULE,DELAYED RELEASE
ORAL_CAPSULE | Freq: Every day | ORAL | 3 refills | 90 days | Status: CP
Start: 2019-05-17 — End: ?

## 2019-05-26 ENCOUNTER — Ambulatory Visit
Admit: 2019-05-26 | Discharge: 2019-05-27 | Payer: MEDICAID | Attending: Rehabilitative and Restorative Service Providers" | Primary: Rehabilitative and Restorative Service Providers"

## 2019-05-26 ENCOUNTER — Ambulatory Visit: Admit: 2019-05-26 | Discharge: 2019-05-27 | Payer: MEDICAID

## 2019-05-26 DIAGNOSIS — M160 Bilateral primary osteoarthritis of hip: Principal | ICD-10-CM

## 2019-05-26 MED ORDER — TRAMADOL 50 MG TABLET
ORAL_TABLET | 0 refills | 0 days | Status: CP
Start: 2019-05-26 — End: ?

## 2019-05-28 ENCOUNTER — Ambulatory Visit: Admit: 2019-05-28 | Discharge: 2019-05-29

## 2019-05-28 ENCOUNTER — Ambulatory Visit: Admit: 2019-05-28 | Discharge: 2019-05-29 | Attending: Emergency Medicine | Primary: Emergency Medicine

## 2019-06-08 ENCOUNTER — Ambulatory Visit
Admit: 2019-06-08 | Discharge: 2019-06-09 | Payer: MEDICAID | Attending: Infectious Disease | Primary: Infectious Disease

## 2019-06-08 DIAGNOSIS — F17200 Nicotine dependence, unspecified, uncomplicated: Principal | ICD-10-CM

## 2019-06-08 DIAGNOSIS — B20 Human immunodeficiency virus [HIV] disease: Principal | ICD-10-CM

## 2019-06-08 MED ORDER — BIKTARVY 50 MG-200 MG-25 MG TABLET
ORAL_TABLET | Freq: Every day | ORAL | 5 refills | 30 days | Status: CP
Start: 2019-06-08 — End: ?

## 2019-06-08 MED ORDER — NICOTINE 21 MG/24 HR DAILY TRANSDERMAL PATCH
MEDICATED_PATCH | TRANSDERMAL | 0 refills | 28 days | Status: CP
Start: 2019-06-08 — End: ?

## 2019-06-15 DIAGNOSIS — F17200 Nicotine dependence, unspecified, uncomplicated: Principal | ICD-10-CM

## 2019-06-18 DIAGNOSIS — F339 Major depressive disorder, recurrent, unspecified: Principal | ICD-10-CM

## 2019-06-18 MED ORDER — QUETIAPINE 25 MG TABLET
ORAL_TABLET | Freq: Every evening | ORAL | 1 refills | 30 days | Status: CP
Start: 2019-06-18 — End: ?

## 2019-06-22 ENCOUNTER — Ambulatory Visit: Admit: 2019-06-22 | Discharge: 2019-06-23 | Payer: MEDICAID | Attending: Medical | Primary: Medical

## 2019-06-22 DIAGNOSIS — F329 Major depressive disorder, single episode, unspecified: Principal | ICD-10-CM

## 2019-06-22 DIAGNOSIS — R0789 Other chest pain: Principal | ICD-10-CM

## 2019-06-22 MED ORDER — SERTRALINE 50 MG TABLET
ORAL_TABLET | ORAL | 0 refills | 106 days | Status: CP
Start: 2019-06-22 — End: 2019-10-06

## 2019-06-25 MED ORDER — ALBUTEROL SULFATE HFA 90 MCG/ACTUATION AEROSOL INHALER
RESPIRATORY_TRACT | 0 refills | 0 days | Status: CP | PRN
Start: 2019-06-25 — End: ?

## 2019-06-25 MED ORDER — TRAMADOL 50 MG TABLET
ORAL_TABLET | 0 refills | 0 days | Status: CP
Start: 2019-06-25 — End: ?

## 2019-07-02 ENCOUNTER — Telehealth: Admit: 2019-07-02 | Discharge: 2019-07-03 | Payer: MEDICAID

## 2019-07-02 DIAGNOSIS — F17200 Nicotine dependence, unspecified, uncomplicated: Principal | ICD-10-CM

## 2019-07-02 DIAGNOSIS — F339 Major depressive disorder, recurrent, unspecified: Principal | ICD-10-CM

## 2019-07-02 MED ORDER — ALBUTEROL SULFATE HFA 90 MCG/ACTUATION AEROSOL INHALER
RESPIRATORY_TRACT | 0 refills | 0.00000 days | Status: CP | PRN
Start: 2019-07-02 — End: ?
  Filled 2019-07-05: qty 18, 17d supply, fill #0

## 2019-07-02 MED ORDER — TRAMADOL 50 MG TABLET: tablet | 0 refills | 0 days | Status: AC

## 2019-07-02 MED ORDER — TRAMADOL 50 MG TABLET
ORAL_TABLET | ORAL | 0 refills | 0.00000 days | Status: CP
Start: 2019-07-02 — End: 2019-07-02
  Filled 2019-07-05: qty 40, 7d supply, fill #0

## 2019-07-02 MED ORDER — NICOTINE 21 MG/24 HR DAILY TRANSDERMAL PATCH
MEDICATED_PATCH | TRANSDERMAL | 0 refills | 28 days | Status: CP
Start: 2019-07-02 — End: ?
  Filled 2019-07-05: qty 28, 28d supply, fill #0

## 2019-07-05 DIAGNOSIS — J309 Allergic rhinitis, unspecified: Principal | ICD-10-CM

## 2019-07-05 MED ORDER — FLUTICASONE PROPIONATE 50 MCG/ACTUATION NASAL SPRAY,SUSPENSION
Freq: Every day | NASAL | 11 refills | 0 days | Status: CP
Start: 2019-07-05 — End: ?
  Filled 2019-07-05: qty 16, 30d supply, fill #0

## 2019-07-05 MED FILL — TRAMADOL 50 MG TABLET: 7 days supply | Qty: 40 | Fill #0 | Status: AC

## 2019-07-05 MED FILL — FLUTICASONE PROPIONATE 50 MCG/ACTUATION NASAL SPRAY,SUSPENSION: 30 days supply | Qty: 16 | Fill #0 | Status: AC

## 2019-07-05 MED FILL — NICOTINE 21 MG/24 HR DAILY TRANSDERMAL PATCH: 28 days supply | Qty: 28 | Fill #0 | Status: AC

## 2019-07-05 MED FILL — ALBUTEROL SULFATE HFA 90 MCG/ACTUATION AEROSOL INHALER: 17 days supply | Qty: 18 | Fill #0 | Status: AC

## 2019-07-08 ENCOUNTER — Ambulatory Visit: Admit: 2019-07-08 | Discharge: 2019-07-09 | Payer: MEDICAID | Attending: Medical | Primary: Medical

## 2019-07-08 DIAGNOSIS — K625 Hemorrhage of anus and rectum: Principal | ICD-10-CM

## 2019-07-08 DIAGNOSIS — Z029 Encounter for administrative examinations, unspecified: Principal | ICD-10-CM

## 2019-07-08 DIAGNOSIS — F329 Major depressive disorder, single episode, unspecified: Principal | ICD-10-CM

## 2019-07-08 MED ORDER — SERTRALINE 50 MG TABLET
ORAL_TABLET | ORAL | 0 refills | 106 days | Status: CP
Start: 2019-07-08 — End: 2019-10-22

## 2019-07-30 ENCOUNTER — Other Ambulatory Visit: Payer: Self-pay

## 2019-07-30 ENCOUNTER — Emergency Department: Payer: Medicaid Other

## 2019-07-30 ENCOUNTER — Emergency Department
Admission: EM | Admit: 2019-07-30 | Discharge: 2019-07-31 | Disposition: A | Discharge status: Home / Self Care | Payer: Medicaid Other | Attending: Emergency Medicine | Admitting: Emergency Medicine

## 2019-07-30 DIAGNOSIS — Y939 Activity, unspecified: Secondary | ICD-10-CM | POA: Insufficient documentation

## 2019-07-30 DIAGNOSIS — Z79899 Other long term (current) drug therapy: Secondary | ICD-10-CM | POA: Insufficient documentation

## 2019-07-30 DIAGNOSIS — W19XXXA Unspecified fall, initial encounter: Secondary | ICD-10-CM

## 2019-07-30 DIAGNOSIS — R519 Headache, unspecified: Secondary | ICD-10-CM | POA: Insufficient documentation

## 2019-07-30 DIAGNOSIS — F10920 Alcohol use, unspecified with intoxication, uncomplicated: Secondary | ICD-10-CM

## 2019-07-30 DIAGNOSIS — Y92019 Unspecified place in single-family (private) house as the place of occurrence of the external cause: Secondary | ICD-10-CM | POA: Insufficient documentation

## 2019-07-30 DIAGNOSIS — T148XXA Other injury of unspecified body region, initial encounter: Secondary | ICD-10-CM

## 2019-07-30 DIAGNOSIS — I10 Essential (primary) hypertension: Secondary | ICD-10-CM | POA: Insufficient documentation

## 2019-07-30 DIAGNOSIS — F17210 Nicotine dependence, cigarettes, uncomplicated: Secondary | ICD-10-CM | POA: Insufficient documentation

## 2019-07-30 DIAGNOSIS — W0110XA Fall on same level from slipping, tripping and stumbling with subsequent striking against unspecified object, initial encounter: Secondary | ICD-10-CM | POA: Insufficient documentation

## 2019-07-30 DIAGNOSIS — Z21 Asymptomatic human immunodeficiency virus [HIV] infection status: Secondary | ICD-10-CM | POA: Insufficient documentation

## 2019-07-30 DIAGNOSIS — Z7984 Long term (current) use of oral hypoglycemic drugs: Secondary | ICD-10-CM | POA: Insufficient documentation

## 2019-07-30 DIAGNOSIS — S80211A Abrasion, right knee, initial encounter: Secondary | ICD-10-CM | POA: Insufficient documentation

## 2019-07-30 DIAGNOSIS — Y999 Unspecified external cause status: Secondary | ICD-10-CM | POA: Insufficient documentation

## 2019-07-30 DIAGNOSIS — S80212A Abrasion, left knee, initial encounter: Secondary | ICD-10-CM | POA: Insufficient documentation

## 2019-07-30 DIAGNOSIS — E119 Type 2 diabetes mellitus without complications: Secondary | ICD-10-CM | POA: Insufficient documentation

## 2019-07-30 DIAGNOSIS — S0081XA Abrasion of other part of head, initial encounter: Secondary | ICD-10-CM | POA: Insufficient documentation

## 2019-07-30 NOTE — Discharge Instructions (Addendum)
Please seek medical attention for any high fevers, chest pain, shortness of breath, change in behavior, persistent vomiting, bloody stool or any other new or concerning symptoms.  

## 2019-07-30 NOTE — ED Provider Notes (Signed)
[facility] Emergency Department Provider Note   ____________________________________________   I have reviewed the triage vital signs and the nursing notes.   HISTORY  Chief Complaint Fall and Alcohol Intoxication   History limited by: Intoxication   HPI Patrick Galloway is a 59 y.o. male who presents to the emergency department today after a fall. The patient states that he has had issues with hip and knee pain. Has received shots but states turned to alcohol today to help with the pain. Says he knows he drank too much today. Says that he had a fall. Complaining of some head pain and bilateral knee pain.    Records reviewed. Per medical record review patient has a history of arthritis in hips and knees.   Past Medical History:  Diagnosis Date  . Anemia   . Arthritis    hips and knees  . DM2 (diabetes mellitus, type 2) (HCC)   . Gastric ulcer   . HIV infection (HCC)   . Hyperlipemia   . Hypertension   . Immune deficiency disorder (HCC)    PT reports HIV  . Intestinal metaplasia of gastric cardia   . Major depressive disorder   . Seizures (HCC)   . Sleep apnea    No CPAP    Patient Active Problem List   Diagnosis Date Noted  . Intestinal metaplasia of gastric mucosa   . Anemia 07/23/2017  . MDD (major depressive disorder), recurrent episode, moderate (HCC) 07/21/2017  . Acute gastrointestinal ulcer with hemorrhage   . History of colonic polyps   . Rectal polyp   . HIV (human immunodeficiency virus infection) (HCC) 07/11/2017  . Acute gastric ulcer with hemorrhage   . Melena   . Upper GI bleed 05/23/2017  . HIV test positive (HCC) 04/30/2017  . Mixed hyperlipidemia 01/24/2016  . Hypertension associated with diabetes (HCC) 01/24/2016  . Diabetes mellitus type 2 in obese (HCC) 01/24/2016  . Other chest pain 04/18/2015  . URI, acute 10/07/2014  . Perirectal cyst 09/30/2014  . Bone spur 09/30/2014  . Hyperthyroidism 09/07/2014  . Tobacco  dependence 07/08/2014  . 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 Pterygium 04/12/2011  . Obesity 10/19/2010  . Primary localized osteoarthrosis, lower leg 09/26/2010    Past Surgical History:  Procedure Laterality Date  . ABDOMINAL SURGERY    . COLONOSCOPY WITH PROPOFOL N/A 07/17/2017   Procedure: COLONOSCOPY WITH PROPOFOL;  Surgeon: [name], MD;  Location: [facility];  Service: Endoscopy;  Laterality: N/A;  . ESOPHAGOGASTRODUODENOSCOPY (EGD) WITH PROPOFOL N/A 05/24/2017   Procedure: ESOPHAGOGASTRODUODENOSCOPY (EGD) WITH PROPOFOL;  Surgeon: [name], MD;  Location: [facility];  Service: Endoscopy;  Laterality: N/A;  . ESOPHAGOGASTRODUODENOSCOPY (EGD) WITH PROPOFOL N/A 07/17/2017   Procedure: ESOPHAGOGASTRODUODENOSCOPY (EGD) WITH PROPOFOL;  Surgeon: [name], MD;  Location: [facility];  Service: Endoscopy;  Laterality: N/A;  . ESOPHAGOGASTRODUODENOSCOPY (EGD) WITH PROPOFOL N/A 02/06/2018   Procedure: ESOPHAGOGASTRODUODENOSCOPY (EGD) WITH PROPOFOL;  Surgeon: [name], MD;  Location: [facility];  Service: Endoscopy;  Laterality: N/A;  Diabetic - oral meds Cannot arrive before 8:30 AM  . none    . POLYPECTOMY  07/17/2017   Procedure: POLYPECTOMY;  Surgeon: [name], MD;  Location: [facility];  Service: Endoscopy;;    Prior to Admission medications   Medication Sig Start Date End Date Taking? Authorizing Provider  albuterol (PROVENTIL HFA;VENTOLIN HFA) 108 (90 Base) MCG/ACT inhaler Inhale 2 puffs into the lungs every 6 (six) hours as needed for wheezing  or shortness of breath. 08/04/17   Arta Silence, MD  amoxicillin (AMOXIL) 500 MG capsule Take 1 capsule (500 mg total) by mouth 3 (three) times daily. 06/12/18   Laban Emperor, PA-C  atorvastatin (LIPITOR) 40 MG tablet Take 1 tablet by mouth daily. 05/08/17   [provider]  BIKTARVY 50-200-25 MG TABS tablet Take 1 tablet by mouth daily. 05/20/17   [provider]   citalopram (CELEXA) 10 MG tablet Take 1.5 tablets (15 mg total) by mouth daily. 01/07/18   Ursula Alert, MD  fluticasone (FLONASE) 50 MCG/ACT nasal spray Place 2 sprays into both nostrils as needed for allergies or rhinitis.    [provider]  hydrOXYzine (VISTARIL) 25 MG capsule Take 1 capsule (25 mg total) by mouth 3 (three) times daily as needed (only for severe anxiety and agitation). 01/07/18   Ursula Alert, MD  lansoprazole (PREVACID) 30 MG capsule Take 1 capsule (30 mg total) by mouth 2 (two) times daily before a meal. 08/24/18   Lucilla Lame, MD  lidocaine (XYLOCAINE) 2 % solution Use as directed 10 mLs in the mouth or throat as needed for mouth pain. 06/12/18   Laban Emperor, PA-C  lisinopril (PRINIVIL,ZESTRIL) 20 MG tablet Take 1 tablet by mouth daily. 05/08/17   [provider]  metFORMIN (GLUCOPHAGE) 500 MG tablet Take 1 tablet by mouth daily. 05/08/17   [provider]  Multiple Vitamins-Minerals (MULTIVITAMIN ADULTS 50+ PO) Take by mouth daily.    [provider]  pantoprazole (PROTONIX) 40 MG tablet Take 1 tablet (40 mg total) by mouth 2 (two) times daily. 05/26/17   Epifanio Lesches, MD  predniSONE (DELTASONE) 10 MG tablet Take 3 tabs x 2 days, then 2 tabs x 2 days, 1 tab x 2 days 04/13/18   Johnn Hai, PA-C  QUEtiapine (SEROQUEL) 50 MG tablet Take 1 tablet (50 mg total) by mouth at bedtime. 01/07/18   Ursula Alert, MD  sucralfate (CARAFATE) 1 GM/10ML suspension Take 10 mLs (1 g total) by mouth 4 (four) times daily -  with meals and at bedtime. 05/26/17   Epifanio Lesches, MD    Allergies Patient has no known allergies.  Family History  Problem Relation Age of Onset  . Heart failure Mother   . Heart failure Father     Social History Social History   Tobacco Use  . Smoking status: Current Every Day Smoker    Packs/day: 0.50    Years: 25.00    Pack years: 12.50    Types: Cigarettes  . Smokeless tobacco: Never Used  .  Tobacco comment: Has cut back and trying to quit  Substance Use Topics  . Alcohol use: No  . Drug use: Not Currently    Types: Marijuana    Comment: Last used over 4 months ago per patient report    Review of Systems Constitutional: No fever/chills Eyes: No visual changes. ENT: No sore throat. Cardiovascular: Denies chest pain. Respiratory: Denies shortness of breath. Gastrointestinal: No abdominal pain.  No nausea, no vomiting.  No diarrhea.   Genitourinary: Negative for dysuria. Musculoskeletal: Positive for bilateral knee pain.  Skin: Positive for abrasion to knees.  Neurological: Positive for headache.  ____________________________________________   PHYSICAL EXAM:  VITAL SIGNS: ED Triage Vitals  Enc Vitals Group     BP 07/30/19 2208 110/67     Pulse Rate 07/30/19 2208 61     Resp 07/30/19 2208 16     Temp 07/30/19 2208 (!) 97.5 F (36.4 C)  Temp src --      SpO2 07/30/19 2208 99 %     Weight 07/30/19 2209 250 lb (113.4 kg)     Height 07/30/19 2209 6\' 2"  (1.88 m)   Constitutional: Alert and oriented.  Eyes: Conjunctivae are normal.  ENT      Head: Normocephalic. Bruise to forehead.       Nose: No congestion/rhinnorhea.      Mouth/Throat: Mucous membranes are moist.      Neck: No stridor. No midline tenderness.  Hematological/Lymphatic/Immunilogical: No cervical lymphadenopathy. Cardiovascular: Normal rate, regular rhythm.  No murmurs, rubs, or gallops.  Respiratory: Normal respiratory effort without tachypnea nor retractions. Breath sounds are clear and equal bilaterally. No wheezes/rales/rhonchi. Gastrointestinal: Soft and non tender. No rebound. No guarding.  Genitourinary: Deferred Musculoskeletal: Normal range of motion in all extremities. No lower extremity edema. Neurologic:  Appears intoxicated.  Skin:  Abrasions noted to bilateral knees.  ____________________________________________    LABS (pertinent  positives/negatives)  None  ____________________________________________   EKG  None  ____________________________________________    RADIOLOGY  CT head/cervical spine No acute traumatic findings  ____________________________________________   PROCEDURES  Procedures  ____________________________________________   INITIAL IMPRESSION / ASSESSMENT AND PLAN / ED COURSE  Pertinent labs & imaging results that were available during my care of the patient were reviewed by me and considered in my medical decision making (see chart for details).   Patient presented to the emergency department today after a fall whilst intoxicated.  Does have abrasion to his forehead and abrasions to his bilateral knees.  Will obtain imaging to evaluate for any acute injury.  CT head and cervical spine negative for acute fracture.  ____________________________________________   FINAL CLINICAL IMPRESSION(S) / ED DIAGNOSES  Final diagnoses:  Alcoholic intoxication without complication (HCC)  Fall, initial encounter  Abrasion     Note: This dictation was prepared with Dragon dictation. Any transcriptional errors that result from this process are unintentional     , MD 07/30/19 2310

## 2019-07-30 NOTE — ED Notes (Signed)
Pt transported to CT ?

## 2019-07-30 NOTE — ED Triage Notes (Signed)
PT to ED via EMS today c/o fall. PT has been drinking all day, obviously intoxicated. Fell at home once, family was able to get him up and onto deck where pt then lowered himself to the floor and would not get up. Small abrasion to head, abrasions to right knee noted. PT alert and oriented.

## 2019-07-31 NOTE — ED Notes (Signed)
Pt friend and roommate, Patty Sermons, called and given permission by patient to speak to.  His phone number is [phone]

## 2019-07-31 NOTE — ED Notes (Signed)
Jari Favre called this RN to inform that pt's friend, Barron Alvine will be here to get pt at 9am.

## 2019-07-31 NOTE — ED Notes (Signed)
This RN went over discharge instructions with pt. Pt verbalized understanding. Unable to obtain electronic signature at this time.

## 2019-07-31 NOTE — ED Notes (Signed)
This RN spoke w/ pt's friend Jari Favre to see if he can contact pt's brother and sister for transportation.

## 2019-07-31 NOTE — ED Notes (Signed)
Pt urinated on self again.  Taken to shower room to be cleaned and changed.

## 2019-07-31 NOTE — ED Notes (Signed)
This RN attempted to call pt's sister, Velna Hatchet [phone] and pt's brother [phone]. This RN unable to reach either at this time.

## 2019-07-31 NOTE — ED Notes (Signed)
Assisted steven, rn with cleansing pt of urine and placing paper scrubs on. Pt's t shirt, sandals and shorts placed in belonging bag and hung on iv pole at head of pt's bed.

## 2019-07-31 NOTE — ED Notes (Signed)
Patient is heavily sleeping at this time.  Attempting to wake patient to review discharge but patient is very drowsy and having trouble staying awake at this time.

## 2019-07-31 NOTE — ED Notes (Signed)
Pt taken to decon shower room to be cleaned and changed into new clothes d/t urinating on self.  Cleaned by this RN and Trenton Founds, EDT.  Pt dry and back to hallway, on phone with Jari Favre.

## 2019-08-26 ENCOUNTER — Ambulatory Visit: Admit: 2019-08-26 | Discharge: 2019-08-27 | Attending: Adult Health | Primary: Adult Health

## 2019-08-26 ENCOUNTER — Ambulatory Visit: Admit: 2019-08-26 | Discharge: 2019-08-27 | Payer: MEDICAID

## 2019-08-30 DIAGNOSIS — M1710 Unilateral primary osteoarthritis, unspecified knee: Principal | ICD-10-CM

## 2019-08-30 DIAGNOSIS — M25551 Pain in right hip: Principal | ICD-10-CM

## 2019-08-30 MED ORDER — TRAMADOL 50 MG TABLET
ORAL_TABLET | Freq: Three times a day (TID) | ORAL | 0 refills | 7 days | Status: CP | PRN
Start: 2019-08-30 — End: ?
  Filled 2019-09-02: qty 40, 7d supply, fill #0

## 2019-09-01 ENCOUNTER — Ambulatory Visit: Admit: 2019-09-01 | Discharge: 2019-09-02 | Payer: MEDICAID | Attending: Clinical | Primary: Clinical

## 2019-09-01 DIAGNOSIS — B20 Human immunodeficiency virus [HIV] disease: Principal | ICD-10-CM

## 2019-09-02 MED FILL — TRAMADOL 50 MG TABLET: 7 days supply | Qty: 40 | Fill #0 | Status: AC

## 2019-09-16 DIAGNOSIS — E782 Mixed hyperlipidemia: Principal | ICD-10-CM

## 2019-09-28 ENCOUNTER — Ambulatory Visit: Admit: 2019-09-28 | Discharge: 2019-09-29 | Payer: MEDICAID | Attending: Clinical | Primary: Clinical

## 2019-09-28 DIAGNOSIS — M25551 Pain in right hip: Principal | ICD-10-CM

## 2019-09-29 MED ORDER — TRAMADOL 50 MG TABLET: tablet | Freq: Three times a day (TID) | 0 refills | 7 days | Status: AC

## 2019-09-29 MED ORDER — TRAMADOL 50 MG TABLET
ORAL_TABLET | Freq: Three times a day (TID) | ORAL | 0 refills | 7.00000 days | Status: CP | PRN
Start: 2019-09-29 — End: 2019-09-29
  Filled 2019-09-30: qty 40, 7d supply, fill #0

## 2019-09-30 MED FILL — TRAMADOL 50 MG TABLET: 7 days supply | Qty: 40 | Fill #0 | Status: AC

## 2019-10-05 ENCOUNTER — Ambulatory Visit: Admit: 2019-10-05 | Discharge: 2019-10-06 | Attending: Emergency Medicine | Primary: Emergency Medicine

## 2019-10-05 DIAGNOSIS — M160 Bilateral primary osteoarthritis of hip: Principal | ICD-10-CM

## 2019-10-15 DIAGNOSIS — F329 Major depressive disorder, single episode, unspecified: Principal | ICD-10-CM

## 2019-10-18 DIAGNOSIS — F339 Major depressive disorder, recurrent, unspecified: Principal | ICD-10-CM

## 2019-10-18 DIAGNOSIS — K219 Gastro-esophageal reflux disease without esophagitis: Principal | ICD-10-CM

## 2019-10-18 MED ORDER — SERTRALINE 50 MG TABLET
ORAL_TABLET | Freq: Every day | ORAL | 3 refills | 90 days | Status: CP
Start: 2019-10-18 — End: 2020-01-16

## 2019-10-18 MED ORDER — LANSOPRAZOLE 30 MG CAPSULE,DELAYED RELEASE
ORAL_CAPSULE | Freq: Every day | ORAL | 3 refills | 90.00000 days | Status: CP
Start: 2019-10-18 — End: ?

## 2019-10-19 MED ORDER — QUETIAPINE 25 MG TABLET
ORAL_TABLET | Freq: Every evening | ORAL | 3 refills | 90.00000 days | Status: CP
Start: 2019-10-19 — End: ?

## 2019-10-19 MED ORDER — LANSOPRAZOLE 30 MG CAPSULE,DELAYED RELEASE
ORAL_CAPSULE | Freq: Every day | ORAL | 3 refills | 90.00000 days | Status: CP
Start: 2019-10-19 — End: ?

## 2019-11-05 DIAGNOSIS — I10 Essential (primary) hypertension: Principal | ICD-10-CM

## 2019-11-05 MED ORDER — LISINOPRIL 20 MG TABLET
ORAL_TABLET | 3 refills | 0 days | Status: CP
Start: 2019-11-05 — End: ?

## 2019-11-24 ENCOUNTER — Ambulatory Visit: Admit: 2019-11-24 | Discharge: 2019-11-24 | Payer: MEDICAID

## 2019-11-24 DIAGNOSIS — K625 Hemorrhage of anus and rectum: Principal | ICD-10-CM

## 2019-11-24 DIAGNOSIS — F329 Major depressive disorder, single episode, unspecified: Principal | ICD-10-CM

## 2019-11-24 DIAGNOSIS — M25569 Pain in unspecified knee: Principal | ICD-10-CM

## 2019-11-24 DIAGNOSIS — B20 Human immunodeficiency virus [HIV] disease: Principal | ICD-10-CM

## 2019-11-26 ENCOUNTER — Ambulatory Visit: Admit: 2019-11-26 | Payer: MEDICAID | Attending: Registered" | Primary: Registered"

## 2019-12-06 ENCOUNTER — Institutional Professional Consult (permissible substitution): Admit: 2019-12-06 | Discharge: 2019-12-07 | Payer: MEDICAID | Attending: Registered" | Primary: Registered"

## 2019-12-06 DIAGNOSIS — B20 Human immunodeficiency virus [HIV] disease: Principal | ICD-10-CM

## 2019-12-13 DIAGNOSIS — B20 Human immunodeficiency virus [HIV] disease: Principal | ICD-10-CM

## 2019-12-13 MED ORDER — BIKTARVY 50 MG-200 MG-25 MG TABLET
ORAL_TABLET | Freq: Every day | ORAL | 5 refills | 30 days | Status: CP
Start: 2019-12-13 — End: ?

## 2019-12-20 DIAGNOSIS — B20 Human immunodeficiency virus [HIV] disease: Principal | ICD-10-CM

## 2019-12-21 ENCOUNTER — Institutional Professional Consult (permissible substitution): Admit: 2019-12-21 | Discharge: 2019-12-22 | Attending: Registered" | Primary: Registered"

## 2019-12-29 ENCOUNTER — Ambulatory Visit
Admit: 2019-12-29 | Discharge: 2019-12-29 | Attending: Student in an Organized Health Care Education/Training Program | Primary: Student in an Organized Health Care Education/Training Program

## 2019-12-29 ENCOUNTER — Ambulatory Visit: Admit: 2019-12-29 | Discharge: 2019-12-29 | Attending: Emergency Medicine | Primary: Emergency Medicine

## 2019-12-29 ENCOUNTER — Ambulatory Visit: Admit: 2019-12-29 | Discharge: 2019-12-29

## 2019-12-29 DIAGNOSIS — B20 Human immunodeficiency virus [HIV] disease: Principal | ICD-10-CM

## 2019-12-29 DIAGNOSIS — K625 Hemorrhage of anus and rectum: Principal | ICD-10-CM

## 2020-01-05 ENCOUNTER — Ambulatory Visit
Admit: 2020-01-05 | Discharge: 2020-01-06 | Payer: MEDICAID | Attending: Student in an Organized Health Care Education/Training Program | Primary: Student in an Organized Health Care Education/Training Program

## 2020-01-18 ENCOUNTER — Telehealth: Admit: 2020-01-18 | Discharge: 2020-01-19 | Payer: MEDICAID | Attending: Registered" | Primary: Registered"

## 2020-01-18 DIAGNOSIS — E669 Obesity, unspecified: Principal | ICD-10-CM

## 2020-01-28 DIAGNOSIS — R05 Cough: Principal | ICD-10-CM

## 2020-02-17 ENCOUNTER — Ambulatory Visit: Admit: 2020-02-17 | Discharge: 2020-02-18 | Attending: Medical | Primary: Medical

## 2020-02-17 DIAGNOSIS — E1169 Type 2 diabetes mellitus with other specified complication: Secondary | ICD-10-CM

## 2020-02-17 DIAGNOSIS — M25562 Pain in left knee: Secondary | ICD-10-CM

## 2020-02-17 DIAGNOSIS — F32A Depression, unspecified depression type: Principal | ICD-10-CM

## 2020-02-17 DIAGNOSIS — Z0000 Encounter for general adult medical examination without abnormal findings: Principal | ICD-10-CM

## 2020-02-17 DIAGNOSIS — M25551 Pain in right hip: Principal | ICD-10-CM

## 2020-02-17 DIAGNOSIS — M25561 Pain in right knee: Principal | ICD-10-CM

## 2020-02-17 DIAGNOSIS — Z6839 Body mass index (BMI) 39.0-39.9, adult: Secondary | ICD-10-CM

## 2020-02-17 DIAGNOSIS — I10 Essential (primary) hypertension: Principal | ICD-10-CM

## 2020-02-17 DIAGNOSIS — Z125 Encounter for screening for malignant neoplasm of prostate: Principal | ICD-10-CM

## 2020-02-17 DIAGNOSIS — Z1159 Encounter for screening for other viral diseases: Principal | ICD-10-CM

## 2020-02-17 DIAGNOSIS — G8929 Other chronic pain: Principal | ICD-10-CM

## 2020-02-17 DIAGNOSIS — R0602 Shortness of breath: Principal | ICD-10-CM

## 2020-02-17 DIAGNOSIS — E782 Mixed hyperlipidemia: Principal | ICD-10-CM

## 2020-02-17 DIAGNOSIS — E669 Obesity, unspecified: Secondary | ICD-10-CM

## 2020-02-17 DIAGNOSIS — H6120 Impacted cerumen, unspecified ear: Principal | ICD-10-CM

## 2020-02-17 DIAGNOSIS — Z720 Tobacco use: Principal | ICD-10-CM

## 2020-02-17 MED ORDER — METFORMIN 500 MG TABLET
ORAL_TABLET | Freq: Every day | ORAL | 3 refills | 90 days | Status: CP
Start: 2020-02-17 — End: ?

## 2020-02-17 MED ORDER — TRAMADOL 50 MG TABLET
ORAL_TABLET | Freq: Three times a day (TID) | ORAL | 0 refills | 7 days | Status: CP | PRN
Start: 2020-02-17 — End: ?

## 2020-02-17 MED ORDER — SERTRALINE 100 MG TABLET
ORAL_TABLET | Freq: Every day | ORAL | 3 refills | 90 days | Status: CP
Start: 2020-02-17 — End: 2020-05-17

## 2020-02-17 MED ORDER — ATORVASTATIN 40 MG TABLET
ORAL_TABLET | Freq: Every day | ORAL | 3 refills | 90.00000 days | Status: CP
Start: 2020-02-17 — End: ?

## 2020-02-24 ENCOUNTER — Ambulatory Visit: Admit: 2020-02-24 | Discharge: 2020-02-25 | Payer: MEDICAID | Attending: Psychiatry | Primary: Psychiatry

## 2020-02-24 DIAGNOSIS — F32A Depression, unspecified depression type: Principal | ICD-10-CM

## 2020-02-24 MED ORDER — SERTRALINE 100 MG TABLET
ORAL_TABLET | Freq: Every day | ORAL | 1 refills | 90 days | Status: CP
Start: 2020-02-24 — End: 2020-08-22

## 2020-03-08 DIAGNOSIS — F339 Major depressive disorder, recurrent, unspecified: Principal | ICD-10-CM

## 2020-03-13 ENCOUNTER — Ambulatory Visit: Admit: 2020-03-13 | Discharge: 2020-03-14

## 2020-03-13 DIAGNOSIS — Z720 Tobacco use: Principal | ICD-10-CM

## 2020-03-13 DIAGNOSIS — B20 Human immunodeficiency virus [HIV] disease: Principal | ICD-10-CM

## 2020-03-13 DIAGNOSIS — J449 Chronic obstructive pulmonary disease, unspecified: Principal | ICD-10-CM

## 2020-03-13 DIAGNOSIS — E785 Hyperlipidemia, unspecified: Principal | ICD-10-CM

## 2020-03-13 DIAGNOSIS — Z21 Asymptomatic human immunodeficiency virus [HIV] infection status: Principal | ICD-10-CM

## 2020-03-13 DIAGNOSIS — R059 Cough, unspecified: Principal | ICD-10-CM

## 2020-03-13 DIAGNOSIS — E119 Type 2 diabetes mellitus without complications: Principal | ICD-10-CM

## 2020-03-13 DIAGNOSIS — Z6839 Body mass index (BMI) 39.0-39.9, adult: Principal | ICD-10-CM

## 2020-03-13 DIAGNOSIS — I10 Essential (primary) hypertension: Principal | ICD-10-CM

## 2020-03-13 MED ORDER — LANCETS 28 GAUGE
0 refills | 0.00000 days
Start: 2020-03-13 — End: 2020-03-21

## 2020-03-13 MED ORDER — BLOOD-GLUCOSE METER KIT WRAPPER
0 refills | 0 days
Start: 2020-03-13 — End: 2021-03-13

## 2020-03-14 DIAGNOSIS — M25551 Pain in right hip: Principal | ICD-10-CM

## 2020-03-14 MED ORDER — TRAMADOL 50 MG TABLET
ORAL_TABLET | Freq: Three times a day (TID) | ORAL | 0 refills | 7.00000 days | Status: CP | PRN
Start: 2020-03-14 — End: 2020-04-07
  Filled 2020-03-16: qty 40, 7d supply, fill #0

## 2020-03-14 MED ORDER — (~~LOC~~ PAP ONLY) SPIRIVA RESPIMAT 2.5 MCG/ACTUATION SOLUTION FOR INHALN
Freq: Every day | RESPIRATORY_TRACT | 11 refills | 0 days | Status: CP
Start: 2020-03-14 — End: 2020-04-13
  Filled 2020-03-16: qty 4, 60d supply, fill #0

## 2020-03-14 MED ORDER — SPIRIVA RESPIMAT 2.5 MCG/ACTUATION SOLUTION FOR INHALATION
Freq: Every day | RESPIRATORY_TRACT | 11 refills | 0 days | Status: CP
Start: 2020-03-14 — End: 2020-05-12

## 2020-03-16 MED FILL — SPIRIVA RESPIMAT 2.5 MCG/ACTUATION SOLUTION FOR INHALATION: 60 days supply | Qty: 4 | Fill #0 | Status: AC

## 2020-03-16 MED FILL — TRAMADOL 50 MG TABLET: 7 days supply | Qty: 40 | Fill #0 | Status: AC

## 2020-03-17 DIAGNOSIS — H6120 Impacted cerumen, unspecified ear: Principal | ICD-10-CM

## 2020-03-21 ENCOUNTER — Institutional Professional Consult (permissible substitution): Admit: 2020-03-21 | Discharge: 2020-03-22 | Attending: Registered" | Primary: Registered"

## 2020-03-22 ENCOUNTER — Ambulatory Visit
Admit: 2020-03-22 | Discharge: 2020-03-23 | Attending: Student in an Organized Health Care Education/Training Program | Primary: Student in an Organized Health Care Education/Training Program

## 2020-03-22 DIAGNOSIS — E669 Obesity, unspecified: Principal | ICD-10-CM

## 2020-03-22 DIAGNOSIS — B20 Human immunodeficiency virus [HIV] disease: Principal | ICD-10-CM

## 2020-03-22 DIAGNOSIS — Z716 Tobacco abuse counseling: Principal | ICD-10-CM

## 2020-03-22 DIAGNOSIS — K08409 Partial loss of teeth, unspecified cause, unspecified class: Principal | ICD-10-CM

## 2020-03-22 DIAGNOSIS — F32A Depression, unspecified depression type: Principal | ICD-10-CM

## 2020-03-22 DIAGNOSIS — J449 Chronic obstructive pulmonary disease, unspecified: Principal | ICD-10-CM

## 2020-03-22 DIAGNOSIS — E1169 Type 2 diabetes mellitus with other specified complication: Principal | ICD-10-CM

## 2020-03-22 MED ORDER — LANCETS 28 GAUGE
0 refills | 0 days
Start: 2020-03-22 — End: 2020-05-15

## 2020-03-23 ENCOUNTER — Ambulatory Visit: Admit: 2020-03-23 | Discharge: 2020-03-24 | Attending: Psychiatry | Primary: Psychiatry

## 2020-03-23 DIAGNOSIS — F17200 Nicotine dependence, unspecified, uncomplicated: Principal | ICD-10-CM

## 2020-03-23 DIAGNOSIS — F32A Depression, unspecified depression type: Principal | ICD-10-CM

## 2020-03-23 DIAGNOSIS — F411 Generalized anxiety disorder: Principal | ICD-10-CM

## 2020-04-06 DIAGNOSIS — H6120 Impacted cerumen, unspecified ear: Principal | ICD-10-CM

## 2020-04-07 DIAGNOSIS — M25551 Pain in right hip: Principal | ICD-10-CM

## 2020-04-07 MED ORDER — TRAMADOL 50 MG TABLET
ORAL_TABLET | Freq: Three times a day (TID) | ORAL | 0 refills | 7 days | Status: CP | PRN
Start: 2020-04-07 — End: 2020-06-19
  Filled 2020-04-25: qty 40, 7d supply, fill #0

## 2020-04-19 ENCOUNTER — Ambulatory Visit: Admit: 2020-04-19 | Discharge: 2020-04-20 | Attending: Registered" | Primary: Registered"

## 2020-04-25 MED FILL — TRAMADOL 50 MG TABLET: 7 days supply | Qty: 40 | Fill #0 | Status: AC

## 2020-05-06 MED ORDER — TRAMADOL 50 MG TABLET
ORAL_TABLET | Freq: Three times a day (TID) | ORAL | 0 refills | 7 days | Status: CP | PRN
Start: 2020-05-06 — End: ?
  Filled 2020-05-12: qty 40, 7d supply, fill #0

## 2020-05-12 MED ORDER — SPIRIVA RESPIMAT 2.5 MCG/ACTUATION SOLUTION FOR INHALATION
Freq: Every day | RESPIRATORY_TRACT | 11 refills | 0.00000 days | Status: CP
Start: 2020-05-12 — End: 2020-06-11

## 2020-05-15 ENCOUNTER — Ambulatory Visit: Admit: 2020-05-15 | Discharge: 2020-05-16

## 2020-05-15 DIAGNOSIS — H938X9 Other specified disorders of ear, unspecified ear: Principal | ICD-10-CM

## 2020-05-15 DIAGNOSIS — E785 Hyperlipidemia, unspecified: Principal | ICD-10-CM

## 2020-05-15 DIAGNOSIS — R059 Cough, unspecified: Principal | ICD-10-CM

## 2020-05-15 DIAGNOSIS — J309 Allergic rhinitis, unspecified: Principal | ICD-10-CM

## 2020-05-15 DIAGNOSIS — E669 Obesity, unspecified: Principal | ICD-10-CM

## 2020-05-15 DIAGNOSIS — Z7984 Long term (current) use of oral hypoglycemic drugs: Principal | ICD-10-CM

## 2020-05-15 DIAGNOSIS — Z6841 Body Mass Index (BMI) 40.0 and over, adult: Principal | ICD-10-CM

## 2020-05-15 DIAGNOSIS — E7800 Pure hypercholesterolemia, unspecified: Principal | ICD-10-CM

## 2020-05-15 DIAGNOSIS — J449 Chronic obstructive pulmonary disease, unspecified: Principal | ICD-10-CM

## 2020-05-15 DIAGNOSIS — F17210 Nicotine dependence, cigarettes, uncomplicated: Principal | ICD-10-CM

## 2020-05-15 DIAGNOSIS — F17200 Nicotine dependence, unspecified, uncomplicated: Principal | ICD-10-CM

## 2020-05-15 DIAGNOSIS — I10 Essential (primary) hypertension: Principal | ICD-10-CM

## 2020-05-15 DIAGNOSIS — Z720 Tobacco use: Principal | ICD-10-CM

## 2020-05-15 DIAGNOSIS — E119 Type 2 diabetes mellitus without complications: Principal | ICD-10-CM

## 2020-05-15 MED ORDER — ALBUTEROL SULFATE HFA 90 MCG/ACTUATION AEROSOL INHALER
RESPIRATORY_TRACT | 11 refills | 85.00000 days | Status: CP | PRN
Start: 2020-05-15 — End: ?
  Filled 2020-05-25: qty 18, 17d supply, fill #0

## 2020-05-15 MED ORDER — SPIRIVA RESPIMAT 2.5 MCG/ACTUATION SOLUTION FOR INHALATION
Freq: Every day | RESPIRATORY_TRACT | 11 refills | 0.00000 days | Status: CP
Start: 2020-05-15 — End: 2020-06-29
  Filled 2020-05-30: qty 4, 30d supply, fill #0

## 2020-05-15 MED ORDER — LANCETS 28 GAUGE
0 refills | 0.00000 days
Start: 2020-05-15 — End: 2020-06-05

## 2020-05-15 MED ORDER — NICOTINE 21 MG/24 HR DAILY TRANSDERMAL PATCH
MEDICATED_PATCH | TRANSDERMAL | 3 refills | 28 days | Status: CP
Start: 2020-05-15 — End: ?

## 2020-05-16 ENCOUNTER — Ambulatory Visit: Admit: 2020-05-16 | Discharge: 2020-05-16 | Payer: MEDICAID | Attending: Family | Primary: Family

## 2020-05-16 ENCOUNTER — Encounter
Admit: 2020-05-16 | Discharge: 2020-05-16 | Payer: MEDICAID | Attending: Audiologist-Hearing Aid Fitter | Primary: Audiologist-Hearing Aid Fitter

## 2020-05-16 DIAGNOSIS — H90A31 Mixed conductive and sensorineural hearing loss, unilateral, right ear with restricted hearing on the contralateral side: Principal | ICD-10-CM

## 2020-05-16 DIAGNOSIS — H6121 Impacted cerumen, right ear: Principal | ICD-10-CM

## 2020-05-16 DIAGNOSIS — E119 Type 2 diabetes mellitus without complications: Principal | ICD-10-CM

## 2020-05-16 DIAGNOSIS — I10 Essential (primary) hypertension: Principal | ICD-10-CM

## 2020-05-16 DIAGNOSIS — Z79899 Other long term (current) drug therapy: Principal | ICD-10-CM

## 2020-05-16 DIAGNOSIS — E7800 Pure hypercholesterolemia, unspecified: Principal | ICD-10-CM

## 2020-05-16 DIAGNOSIS — Z7984 Long term (current) use of oral hypoglycemic drugs: Principal | ICD-10-CM

## 2020-05-16 DIAGNOSIS — H9201 Otalgia, right ear: Principal | ICD-10-CM

## 2020-05-16 DIAGNOSIS — F17210 Nicotine dependence, cigarettes, uncomplicated: Principal | ICD-10-CM

## 2020-05-16 DIAGNOSIS — H6120 Impacted cerumen, unspecified ear: Principal | ICD-10-CM

## 2020-05-16 DIAGNOSIS — H90A22 Sensorineural hearing loss, unilateral, left ear, with restricted hearing on the contralateral side: Principal | ICD-10-CM

## 2020-05-16 MED ORDER — NEOMYCIN-POLYMYXIN-HYDROCORT 3.5 MG/ML-10,000 UNIT/ML-1 % EAR SOLUTION
2 refills | 0.00000 days | Status: CP
Start: 2020-05-16 — End: 2020-05-17

## 2020-05-17 DIAGNOSIS — Z139 Encounter for screening, unspecified: Secondary | ICD-10-CM

## 2020-05-17 DIAGNOSIS — H9201 Otalgia, right ear: Principal | ICD-10-CM

## 2020-05-17 MED ORDER — CIPROFLOXACIN 0.3 %-DEXAMETHASONE 0.1 % EAR DROPS,SUSPENSION
Freq: Two times a day (BID) | OTIC | 2 refills | 19 days | Status: CP
Start: 2020-05-17 — End: ?
  Filled 2020-05-23: qty 7.5, 19d supply, fill #0

## 2020-05-17 NOTE — Congregational Nurse Program (Signed)
Client into food pantry nurse only clinic requesting Blood sugar and blood pressure screenings as he doesn't have equipment at home. States that he's working on getting the glucometer approved through his insurance. States he's taking meds as prescribed. Saw pcp @ [facility] yesterday. Reviewed limiting sugars/carb foods. Blood sugar 189 non fasting; BP 140/84. Pt agrees to return to this clinic for screenings as needed and to follow up w/ MD as encouraged. Encouraged rescreen in 1 week while awaiting equipment. Co. Re basic nutrition for healthy glucose. Rhermann, RN

## 2020-05-23 ENCOUNTER — Encounter: Admit: 2020-05-23 | Discharge: 2020-05-24 | Payer: MEDICAID | Attending: Medical | Primary: Medical

## 2020-05-23 DIAGNOSIS — E782 Mixed hyperlipidemia: Principal | ICD-10-CM

## 2020-05-23 DIAGNOSIS — M1710 Unilateral primary osteoarthritis, unspecified knee: Principal | ICD-10-CM

## 2020-05-23 DIAGNOSIS — J449 Chronic obstructive pulmonary disease, unspecified: Principal | ICD-10-CM

## 2020-05-23 DIAGNOSIS — E1169 Type 2 diabetes mellitus with other specified complication: Principal | ICD-10-CM

## 2020-05-23 DIAGNOSIS — I10 Essential (primary) hypertension: Principal | ICD-10-CM

## 2020-05-23 DIAGNOSIS — M25569 Pain in unspecified knee: Principal | ICD-10-CM

## 2020-05-23 DIAGNOSIS — B20 Human immunodeficiency virus [HIV] disease: Principal | ICD-10-CM

## 2020-05-23 DIAGNOSIS — E669 Obesity, unspecified: Principal | ICD-10-CM

## 2020-05-23 MED ORDER — BLOOD GLUCOSE TEST STRIPS
ORAL_STRIP | 4 refills | 0 days | Status: CP
Start: 2020-05-23 — End: ?
  Filled 2020-05-25: qty 100, 50d supply, fill #0

## 2020-05-23 MED ORDER — METFORMIN 500 MG TABLET
ORAL_TABLET | Freq: Two times a day (BID) | ORAL | 3 refills | 90.00000 days | Status: CP
Start: 2020-05-23 — End: 2020-08-21
  Filled 2020-05-25: qty 180, 90d supply, fill #0

## 2020-05-23 MED ORDER — ON CALL EXPRESS TEST STRIP
4 refills | 0.00000 days | Status: CP
Start: 2020-05-23 — End: ?

## 2020-05-23 MED ORDER — ATORVASTATIN 40 MG TABLET
ORAL_TABLET | Freq: Every day | ORAL | 3 refills | 90.00000 days | Status: CP
Start: 2020-05-23 — End: ?
  Filled 2020-05-25: qty 90, 90d supply, fill #0

## 2020-05-24 ENCOUNTER — Ambulatory Visit: Admit: 2020-05-24 | Discharge: 2020-05-25 | Attending: Registered" | Primary: Registered"

## 2020-05-24 DIAGNOSIS — Z6839 Body mass index (BMI) 39.0-39.9, adult: Principal | ICD-10-CM

## 2020-05-25 MED FILL — FLUTICASONE PROPIONATE 50 MCG/ACTUATION NASAL SPRAY,SUSPENSION: NASAL | 30 days supply | Qty: 16 | Fill #1

## 2020-06-02 ENCOUNTER — Ambulatory Visit: Admit: 2020-06-02 | Discharge: 2020-06-03 | Attending: Emergency Medicine | Primary: Emergency Medicine

## 2020-06-02 DIAGNOSIS — M170 Bilateral primary osteoarthritis of knee: Principal | ICD-10-CM

## 2020-06-05 MED ORDER — LANCETS 30 GAUGE
0 refills | 0.00000 days | Status: CP
Start: 2020-06-05 — End: ?
  Filled 2020-06-07: qty 200, 50d supply, fill #0

## 2020-06-14 MED ORDER — LANCING DEVICE
0 refills | 0 days
Start: 2020-06-14 — End: ?

## 2020-06-14 MED FILL — ON CALL LANCING DEVICE: 30 days supply | Qty: 1 | Fill #0

## 2020-06-19 ENCOUNTER — Ambulatory Visit: Admit: 2020-06-19 | Discharge: 2020-06-20

## 2020-06-19 ENCOUNTER — Other Ambulatory Visit: Admit: 2020-06-19 | Discharge: 2020-06-20

## 2020-06-19 DIAGNOSIS — Z122 Encounter for screening for malignant neoplasm of respiratory organs: Principal | ICD-10-CM

## 2020-06-19 DIAGNOSIS — Z23 Encounter for immunization: Principal | ICD-10-CM

## 2020-06-19 DIAGNOSIS — J449 Chronic obstructive pulmonary disease, unspecified: Principal | ICD-10-CM

## 2020-06-19 DIAGNOSIS — B20 Human immunodeficiency virus [HIV] disease: Principal | ICD-10-CM

## 2020-06-19 DIAGNOSIS — Z720 Tobacco use: Principal | ICD-10-CM

## 2020-06-19 DIAGNOSIS — M25551 Pain in right hip: Principal | ICD-10-CM

## 2020-06-19 MED ORDER — TRAMADOL 50 MG TABLET
ORAL_TABLET | Freq: Three times a day (TID) | ORAL | 0 refills | 7.00000 days | Status: CP | PRN
Start: 2020-06-19 — End: 2020-06-20
  Filled 2020-06-21: qty 40, 7d supply, fill #0

## 2020-06-19 MED ORDER — BIKTARVY 50 MG-200 MG-25 MG TABLET
ORAL_TABLET | Freq: Every day | ORAL | 5 refills | 30 days | Status: CP
Start: 2020-06-19 — End: ?

## 2020-06-20 DIAGNOSIS — M25551 Pain in right hip: Principal | ICD-10-CM

## 2020-06-20 MED ORDER — TRAMADOL 50 MG TABLET
ORAL_TABLET | Freq: Three times a day (TID) | ORAL | 0 refills | 7 days | Status: CP | PRN
Start: 2020-06-20 — End: ?

## 2020-06-21 MED ORDER — BLOOD-GLUCOSE METER
0 refills | 0 days
Start: 2020-06-21 — End: ?

## 2020-06-21 MED FILL — ON CALL EXPRESS METER: 30 days supply | Qty: 1 | Fill #0

## 2020-06-28 ENCOUNTER — Ambulatory Visit: Admit: 2020-06-28 | Discharge: 2020-06-29 | Attending: Registered" | Primary: Registered"

## 2020-06-28 DIAGNOSIS — Z6839 Body mass index (BMI) 39.0-39.9, adult: Principal | ICD-10-CM

## 2020-07-05 MED FILL — SPIRIVA RESPIMAT 2.5 MCG/ACTUATION SOLUTION FOR INHALATION: RESPIRATORY_TRACT | 30 days supply | Qty: 4 | Fill #1

## 2020-07-11 ENCOUNTER — Ambulatory Visit
Admit: 2020-07-11 | Discharge: 2020-07-12 | Payer: MEDICAID | Attending: Emergency Medicine | Primary: Emergency Medicine

## 2020-07-14 DIAGNOSIS — M25551 Pain in right hip: Principal | ICD-10-CM

## 2020-07-16 MED ORDER — TRAMADOL 50 MG TABLET
ORAL_TABLET | Freq: Three times a day (TID) | ORAL | 0 refills | 7 days | Status: CP | PRN
Start: 2020-07-16 — End: ?
  Filled 2020-07-17: qty 40, 7d supply, fill #0

## 2020-07-19 ENCOUNTER — Ambulatory Visit
Admit: 2020-07-19 | Discharge: 2020-07-20 | Attending: Student in an Organized Health Care Education/Training Program | Primary: Student in an Organized Health Care Education/Training Program

## 2020-07-19 DIAGNOSIS — B20 Human immunodeficiency virus [HIV] disease: Principal | ICD-10-CM

## 2020-07-21 ENCOUNTER — Ambulatory Visit: Admit: 2020-07-21 | Discharge: 2020-07-22

## 2020-07-24 DIAGNOSIS — Z122 Encounter for screening for malignant neoplasm of respiratory organs: Principal | ICD-10-CM

## 2020-07-24 DIAGNOSIS — Z720 Tobacco use: Principal | ICD-10-CM

## 2020-07-26 ENCOUNTER — Ambulatory Visit
Admit: 2020-07-26 | Discharge: 2020-07-27 | Attending: Student in an Organized Health Care Education/Training Program | Primary: Student in an Organized Health Care Education/Training Program

## 2020-07-26 ENCOUNTER — Institutional Professional Consult (permissible substitution): Admit: 2020-07-26 | Discharge: 2020-07-27 | Attending: Registered" | Primary: Registered"

## 2020-07-26 DIAGNOSIS — E669 Obesity, unspecified: Principal | ICD-10-CM

## 2020-07-26 DIAGNOSIS — E1169 Type 2 diabetes mellitus with other specified complication: Principal | ICD-10-CM

## 2020-08-07 ENCOUNTER — Ambulatory Visit: Admit: 2020-08-07 | Discharge: 2020-08-08

## 2020-08-07 DIAGNOSIS — Z720 Tobacco use: Principal | ICD-10-CM

## 2020-08-07 DIAGNOSIS — F339 Major depressive disorder, recurrent, unspecified: Principal | ICD-10-CM

## 2020-08-07 DIAGNOSIS — J309 Allergic rhinitis, unspecified: Principal | ICD-10-CM

## 2020-08-07 DIAGNOSIS — J449 Chronic obstructive pulmonary disease, unspecified: Principal | ICD-10-CM

## 2020-08-07 DIAGNOSIS — R059 Cough: Principal | ICD-10-CM

## 2020-08-07 MED ORDER — NICOTINE (POLACRILEX) 2 MG BUCCAL LOZENGE
LOZENGE | BUCCAL | 0 refills | 5.00000 days | PRN
Start: 2020-08-07 — End: ?

## 2020-08-07 MED ORDER — FLUTICASONE PROPIONATE 50 MCG/ACTUATION NASAL SPRAY,SUSPENSION
Freq: Every day | NASAL | 11 refills | 30.00000 days | Status: CP
Start: 2020-08-07 — End: ?
  Filled 2020-08-23: qty 16, 30d supply, fill #0

## 2020-08-07 MED ORDER — CETIRIZINE 5 MG TABLET
ORAL_TABLET | Freq: Every day | ORAL | 11 refills | 30 days | Status: CP
Start: 2020-08-07 — End: 2021-08-07

## 2020-08-09 MED FILL — CIPRODEX 0.3 %-0.1 % EAR DROPS,SUSPENSION: OTIC | 19 days supply | Qty: 7.5 | Fill #1

## 2020-08-21 ENCOUNTER — Encounter: Admit: 2020-08-21 | Discharge: 2020-08-22 | Payer: MEDICAID

## 2020-08-21 ENCOUNTER — Encounter: Admit: 2020-08-21 | Discharge: 2020-08-22 | Payer: MEDICAID | Attending: Medical | Primary: Medical

## 2020-08-21 DIAGNOSIS — E782 Mixed hyperlipidemia: Principal | ICD-10-CM

## 2020-08-21 DIAGNOSIS — J449 Chronic obstructive pulmonary disease, unspecified: Principal | ICD-10-CM

## 2020-08-21 DIAGNOSIS — E1169 Type 2 diabetes mellitus with other specified complication: Principal | ICD-10-CM

## 2020-08-21 DIAGNOSIS — E669 Obesity, unspecified: Principal | ICD-10-CM

## 2020-08-21 DIAGNOSIS — B20 Human immunodeficiency virus [HIV] disease: Principal | ICD-10-CM

## 2020-08-21 DIAGNOSIS — M25551 Pain in right hip: Principal | ICD-10-CM

## 2020-08-21 DIAGNOSIS — F32A Depression, unspecified depression type: Principal | ICD-10-CM

## 2020-08-21 DIAGNOSIS — K219 Gastro-esophageal reflux disease without esophagitis: Principal | ICD-10-CM

## 2020-08-21 MED ORDER — TRAMADOL 50 MG TABLET
ORAL_TABLET | Freq: Three times a day (TID) | ORAL | 0 refills | 7 days | Status: CP | PRN
Start: 2020-08-21 — End: 2020-08-21

## 2020-08-21 MED ORDER — SERTRALINE 100 MG TABLET
ORAL_TABLET | 1 refills | 0.00000 days | Status: CP
Start: 2020-08-21 — End: ?

## 2020-08-21 MED ORDER — LANSOPRAZOLE 30 MG CAPSULE,DELAYED RELEASE
ORAL_CAPSULE | Freq: Every day | ORAL | 3 refills | 90.00000 days | Status: CP
Start: 2020-08-21 — End: 2020-08-21

## 2020-08-23 DIAGNOSIS — M25569 Pain in unspecified knee: Principal | ICD-10-CM

## 2020-08-23 MED ORDER — TRAMADOL 50 MG TABLET
ORAL_TABLET | Freq: Three times a day (TID) | ORAL | 0 refills | 7 days | Status: CP | PRN
Start: 2020-08-23 — End: ?
  Filled 2020-08-25: qty 40, 7d supply, fill #0

## 2020-08-23 MED FILL — SPIRIVA RESPIMAT 2.5 MCG/ACTUATION SOLUTION FOR INHALATION: RESPIRATORY_TRACT | 30 days supply | Qty: 4 | Fill #2

## 2020-08-23 MED FILL — ALBUTEROL SULFATE HFA 90 MCG/ACTUATION AEROSOL INHALER: RESPIRATORY_TRACT | 17 days supply | Qty: 18 | Fill #1

## 2020-08-25 ENCOUNTER — Institutional Professional Consult (permissible substitution): Admit: 2020-08-25 | Discharge: 2020-08-26 | Attending: Registered" | Primary: Registered"

## 2020-08-25 DIAGNOSIS — Z6839 Body mass index (BMI) 39.0-39.9, adult: Principal | ICD-10-CM

## 2020-09-01 DIAGNOSIS — K130 Diseases of lips: Principal | ICD-10-CM

## 2020-09-05 ENCOUNTER — Ambulatory Visit: Admit: 2020-09-05 | Discharge: 2020-09-06 | Payer: MEDICAID

## 2020-09-05 DIAGNOSIS — M79622 Pain in left upper arm: Principal | ICD-10-CM

## 2020-09-05 MED ORDER — CYCLOBENZAPRINE 10 MG TABLET
ORAL_TABLET | Freq: Two times a day (BID) | ORAL | 0 refills | 15.00000 days | Status: CP | PRN
Start: 2020-09-05 — End: ?
  Filled 2020-09-07: qty 30, 15d supply, fill #0

## 2020-09-05 MED ORDER — MELOXICAM 15 MG TABLET
ORAL_TABLET | Freq: Every day | ORAL | 0 refills | 14 days | Status: CP
Start: 2020-09-05 — End: 2020-09-19

## 2020-09-07 MED ORDER — PREDNISONE 20 MG TABLET
ORAL_TABLET | Freq: Every day | ORAL | 0 refills | 5 days | Status: CP
Start: 2020-09-07 — End: ?
  Filled 2020-09-11: qty 5, 5d supply, fill #0

## 2020-09-19 MED ORDER — TRAMADOL 50 MG TABLET
ORAL_TABLET | Freq: Three times a day (TID) | ORAL | 0 refills | 7 days | Status: CP | PRN
Start: 2020-09-19 — End: ?
  Filled 2020-10-03: qty 40, 7d supply, fill #0

## 2020-09-27 ENCOUNTER — Ambulatory Visit: Admit: 2020-09-27 | Discharge: 2020-09-28 | Payer: MEDICAID

## 2020-09-27 DIAGNOSIS — M79602 Pain in left arm: Principal | ICD-10-CM

## 2020-09-27 DIAGNOSIS — M549 Dorsalgia, unspecified: Principal | ICD-10-CM

## 2020-09-27 DIAGNOSIS — M5410 Radiculopathy, site unspecified: Principal | ICD-10-CM

## 2020-09-27 MED ORDER — PREDNISONE 20 MG TABLET
ORAL_TABLET | ORAL | 0 refills | 0.00000 days | Status: CP
Start: 2020-09-27 — End: ?

## 2020-09-28 DIAGNOSIS — M79622 Pain in left upper arm: Principal | ICD-10-CM

## 2020-09-29 MED ORDER — CYCLOBENZAPRINE 10 MG TABLET
ORAL_TABLET | Freq: Two times a day (BID) | ORAL | 0 refills | 15 days | Status: CP | PRN
Start: 2020-09-29 — End: ?
  Filled 2020-10-03: qty 30, 15d supply, fill #0

## 2020-10-03 MED FILL — SPIRIVA RESPIMAT 2.5 MCG/ACTUATION SOLUTION FOR INHALATION: RESPIRATORY_TRACT | 30 days supply | Qty: 4 | Fill #3

## 2020-10-03 MED FILL — ALBUTEROL SULFATE HFA 90 MCG/ACTUATION AEROSOL INHALER: RESPIRATORY_TRACT | 17 days supply | Qty: 18 | Fill #2

## 2020-10-03 MED FILL — FLUTICASONE PROPIONATE 50 MCG/ACTUATION NASAL SPRAY,SUSPENSION: NASAL | 30 days supply | Qty: 16 | Fill #1

## 2020-10-03 MED FILL — ATORVASTATIN 40 MG TABLET: ORAL | 90 days supply | Qty: 90 | Fill #1

## 2020-10-11 DIAGNOSIS — F339 Major depressive disorder, recurrent, unspecified: Principal | ICD-10-CM

## 2020-10-13 MED ORDER — QUETIAPINE 25 MG TABLET
ORAL_TABLET | 3 refills | 0 days | Status: CP
Start: 2020-10-13 — End: ?

## 2020-10-18 MED ORDER — TRAMADOL 50 MG TABLET
ORAL_TABLET | Freq: Three times a day (TID) | ORAL | 0 refills | 7.00000 days | Status: CP | PRN
Start: 2020-10-18 — End: ?
  Filled 2020-10-26: qty 40, 7d supply, fill #0

## 2020-10-19 ENCOUNTER — Ambulatory Visit: Admit: 2020-10-19 | Discharge: 2020-10-20 | Attending: Infectious Disease | Primary: Infectious Disease

## 2020-10-19 ENCOUNTER — Ambulatory Visit: Admit: 2020-10-19 | Discharge: 2020-10-20

## 2020-10-19 DIAGNOSIS — M79602 Pain in left arm: Principal | ICD-10-CM

## 2020-10-19 DIAGNOSIS — K6282 Dysplasia of anus: Principal | ICD-10-CM

## 2020-10-19 DIAGNOSIS — B20 Human immunodeficiency virus [HIV] disease: Principal | ICD-10-CM

## 2020-10-23 DIAGNOSIS — J449 Chronic obstructive pulmonary disease, unspecified: Principal | ICD-10-CM

## 2020-10-24 DIAGNOSIS — M79622 Pain in left upper arm: Principal | ICD-10-CM

## 2020-10-26 MED ORDER — CYCLOBENZAPRINE 10 MG TABLET
ORAL_TABLET | Freq: Two times a day (BID) | ORAL | 0 refills | 15.00000 days | Status: CP | PRN
Start: 2020-10-26 — End: ?
  Filled 2020-10-26: qty 30, 15d supply, fill #0

## 2020-11-10 ENCOUNTER — Ambulatory Visit: Admit: 2020-11-10 | Discharge: 2020-11-11

## 2020-11-10 DIAGNOSIS — M5410 Radiculopathy, site unspecified: Principal | ICD-10-CM

## 2020-11-10 DIAGNOSIS — M79602 Pain in left arm: Principal | ICD-10-CM

## 2020-11-10 DIAGNOSIS — M5412 Radiculopathy, cervical region: Principal | ICD-10-CM

## 2020-11-10 DIAGNOSIS — M25551 Pain in right hip: Principal | ICD-10-CM

## 2020-11-10 MED ORDER — TRAMADOL 50 MG TABLET
ORAL_TABLET | Freq: Three times a day (TID) | ORAL | 0 refills | 10 days | Status: CP | PRN
Start: 2020-11-10 — End: ?

## 2020-11-15 DIAGNOSIS — I10 Essential (primary) hypertension: Principal | ICD-10-CM

## 2020-11-15 DIAGNOSIS — M25551 Pain in right hip: Principal | ICD-10-CM

## 2020-11-15 DIAGNOSIS — M79622 Pain in left upper arm: Principal | ICD-10-CM

## 2020-11-15 MED ORDER — LISINOPRIL 20 MG TABLET
ORAL_TABLET | 3 refills | 0 days | Status: CP
Start: 2020-11-15 — End: ?

## 2020-11-16 MED ORDER — TRAMADOL 50 MG TABLET
ORAL_TABLET | Freq: Three times a day (TID) | ORAL | 0 refills | 7.00000 days | Status: CP | PRN
Start: 2020-11-16 — End: ?
  Filled 2020-11-17: qty 40, 7d supply, fill #0

## 2020-11-16 MED ORDER — CYCLOBENZAPRINE 10 MG TABLET
ORAL_TABLET | Freq: Two times a day (BID) | ORAL | 0 refills | 15.00000 days | Status: CP | PRN
Start: 2020-11-16 — End: ?
  Filled 2020-11-17: qty 30, 15d supply, fill #0

## 2020-11-17 MED FILL — ALBUTEROL SULFATE HFA 90 MCG/ACTUATION AEROSOL INHALER: RESPIRATORY_TRACT | 17 days supply | Qty: 18 | Fill #3

## 2020-11-20 ENCOUNTER — Ambulatory Visit: Admit: 2020-11-20 | Discharge: 2020-11-21

## 2020-11-20 DIAGNOSIS — M79602 Pain in left arm: Principal | ICD-10-CM

## 2020-11-20 DIAGNOSIS — M5412 Radiculopathy, cervical region: Principal | ICD-10-CM

## 2020-11-22 ENCOUNTER — Ambulatory Visit
Admit: 2020-11-22 | Discharge: 2020-11-23 | Attending: Student in an Organized Health Care Education/Training Program | Primary: Student in an Organized Health Care Education/Training Program

## 2020-11-22 DIAGNOSIS — B20 Human immunodeficiency virus [HIV] disease: Principal | ICD-10-CM

## 2020-11-22 DIAGNOSIS — F32A Depression, unspecified depression type: Principal | ICD-10-CM

## 2020-11-22 DIAGNOSIS — H6121 Impacted cerumen, right ear: Principal | ICD-10-CM

## 2020-11-30 ENCOUNTER — Ambulatory Visit: Admit: 2020-11-30 | Discharge: 2020-12-01 | Attending: Emergency Medicine | Primary: Emergency Medicine

## 2020-12-05 ENCOUNTER — Ambulatory Visit: Admit: 2020-12-05 | Discharge: 2020-12-06 | Payer: MEDICAID

## 2020-12-05 DIAGNOSIS — K130 Diseases of lips: Principal | ICD-10-CM

## 2020-12-06 ENCOUNTER — Ambulatory Visit: Admit: 2020-12-06 | Discharge: 2020-12-07

## 2020-12-07 DIAGNOSIS — M79622 Pain in left upper arm: Principal | ICD-10-CM

## 2020-12-07 DIAGNOSIS — M25551 Pain in right hip: Principal | ICD-10-CM

## 2020-12-07 MED ORDER — CYCLOBENZAPRINE 10 MG TABLET
ORAL_TABLET | Freq: Two times a day (BID) | ORAL | 0 refills | 15 days | Status: CP | PRN
Start: 2020-12-07 — End: ?
  Filled 2020-12-11: qty 30, 15d supply, fill #0

## 2020-12-07 MED ORDER — NICOTINE (POLACRILEX) 2 MG BUCCAL LOZENGE
LOZENGE | BUCCAL | 0 refills | 5.00000 days | Status: CP | PRN
Start: 2020-12-07 — End: ?
  Filled 2020-12-11: qty 72, 6d supply, fill #0

## 2020-12-09 MED ORDER — TRAMADOL 50 MG TABLET
ORAL_TABLET | Freq: Three times a day (TID) | ORAL | 0 refills | 7 days | Status: CP | PRN
Start: 2020-12-09 — End: ?
  Filled 2020-12-11: qty 40, 7d supply, fill #0

## 2020-12-11 MED FILL — FLUTICASONE PROPIONATE 50 MCG/ACTUATION NASAL SPRAY,SUSPENSION: NASAL | 30 days supply | Qty: 16 | Fill #2

## 2020-12-11 MED FILL — ALBUTEROL SULFATE HFA 90 MCG/ACTUATION AEROSOL INHALER: RESPIRATORY_TRACT | 17 days supply | Qty: 18 | Fill #4

## 2020-12-13 ENCOUNTER — Ambulatory Visit: Admit: 2020-12-13 | Discharge: 2020-12-14 | Attending: Family | Primary: Family

## 2020-12-14 DIAGNOSIS — B20 Human immunodeficiency virus [HIV] disease: Principal | ICD-10-CM

## 2020-12-19 DIAGNOSIS — B20 Human immunodeficiency virus [HIV] disease: Principal | ICD-10-CM

## 2020-12-19 MED ORDER — BIKTARVY 50 MG-200 MG-25 MG TABLET
ORAL_TABLET | Freq: Every day | ORAL | 5 refills | 30 days | Status: CP
Start: 2020-12-19 — End: ?

## 2020-12-29 ENCOUNTER — Ambulatory Visit: Admit: 2020-12-29 | Discharge: 2020-12-30

## 2020-12-29 DIAGNOSIS — M5412 Radiculopathy, cervical region: Principal | ICD-10-CM

## 2021-01-02 ENCOUNTER — Ambulatory Visit: Admit: 2021-01-02 | Discharge: 2021-01-03

## 2021-01-02 DIAGNOSIS — M160 Bilateral primary osteoarthritis of hip: Principal | ICD-10-CM

## 2021-01-02 DIAGNOSIS — M5412 Radiculopathy, cervical region: Principal | ICD-10-CM

## 2021-01-03 DIAGNOSIS — M79622 Pain in left upper arm: Principal | ICD-10-CM

## 2021-01-03 DIAGNOSIS — M25551 Pain in right hip: Principal | ICD-10-CM

## 2021-01-03 MED ORDER — TRAMADOL 50 MG TABLET
ORAL_TABLET | Freq: Three times a day (TID) | ORAL | 0 refills | 7.00000 days | Status: CP | PRN
Start: 2021-01-03 — End: ?
  Filled 2021-01-05: qty 40, 7d supply, fill #0

## 2021-01-09 DIAGNOSIS — M79622 Pain in left upper arm: Principal | ICD-10-CM

## 2021-01-10 ENCOUNTER — Ambulatory Visit: Admit: 2021-01-10 | Discharge: 2021-01-11

## 2021-01-10 ENCOUNTER — Ambulatory Visit: Admit: 2021-01-10 | Discharge: 2021-01-11 | Payer: MEDICAID | Attending: Family | Primary: Family

## 2021-01-10 DIAGNOSIS — M5412 Radiculopathy, cervical region: Principal | ICD-10-CM

## 2021-01-10 MED ORDER — FLUOCINOLONE ACETONIDE OIL 0.01 % EAR DROPS
Freq: Two times a day (BID) | OTIC | 1 refills | 5 days | Status: CP
Start: 2021-01-10 — End: 2021-01-15

## 2021-01-11 MED FILL — ALBUTEROL SULFATE HFA 90 MCG/ACTUATION AEROSOL INHALER: RESPIRATORY_TRACT | 17 days supply | Qty: 18 | Fill #5

## 2021-01-11 MED FILL — FLUTICASONE PROPIONATE 50 MCG/ACTUATION NASAL SPRAY,SUSPENSION: NASAL | 30 days supply | Qty: 16 | Fill #3

## 2021-01-16 ENCOUNTER — Ambulatory Visit: Admit: 2021-01-16 | Discharge: 2021-01-17

## 2021-01-16 DIAGNOSIS — G959 Disease of spinal cord, unspecified: Principal | ICD-10-CM

## 2021-01-18 MED FILL — ATORVASTATIN 40 MG TABLET: ORAL | 90 days supply | Qty: 90 | Fill #2

## 2021-01-22 DIAGNOSIS — H9201 Otalgia, right ear: Principal | ICD-10-CM

## 2021-01-22 DIAGNOSIS — F32A Depression, unspecified depression type: Principal | ICD-10-CM

## 2021-01-22 MED ORDER — SERTRALINE 100 MG TABLET
ORAL_TABLET | 2 refills | 0.00000 days | Status: CP
Start: 2021-01-22 — End: ?

## 2021-01-22 MED ORDER — CIPROFLOXACIN 0.3 %-DEXAMETHASONE 0.1 % EAR DROPS,SUSPENSION
Freq: Two times a day (BID) | OTIC | 2 refills | 19 days | Status: CP
Start: 2021-01-22 — End: ?
  Filled 2021-03-07: qty 7.5, 19d supply, fill #0

## 2021-01-30 ENCOUNTER — Ambulatory Visit
Admit: 2021-01-30 | Discharge: 2021-01-31 | Payer: MEDICAID | Attending: Emergency Medicine | Primary: Emergency Medicine

## 2021-01-30 DIAGNOSIS — M79622 Pain in left upper arm: Principal | ICD-10-CM

## 2021-01-30 MED ORDER — CYCLOBENZAPRINE 10 MG TABLET
ORAL_TABLET | Freq: Two times a day (BID) | ORAL | 1 refills | 30.00000 days | Status: CP | PRN
Start: 2021-01-30 — End: ?
  Filled 2021-02-01: qty 60, 30d supply, fill #0

## 2021-02-05 MED FILL — FLUTICASONE PROPIONATE 50 MCG/ACTUATION NASAL SPRAY,SUSPENSION: NASAL | 30 days supply | Qty: 16 | Fill #4

## 2021-02-07 DIAGNOSIS — F32A Depression, unspecified depression type: Principal | ICD-10-CM

## 2021-02-07 DIAGNOSIS — M25551 Pain in right hip: Principal | ICD-10-CM

## 2021-02-07 MED ORDER — TRAMADOL 50 MG TABLET
ORAL_TABLET | Freq: Three times a day (TID) | ORAL | 0 refills | 7.00000 days | Status: CP | PRN
Start: 2021-02-07 — End: ?
  Filled 2021-02-09: qty 40, 7d supply, fill #0

## 2021-02-08 ENCOUNTER — Ambulatory Visit: Admit: 2021-02-08 | Discharge: 2021-02-09 | Payer: MEDICAID

## 2021-02-08 ENCOUNTER — Ambulatory Visit
Admit: 2021-02-08 | Discharge: 2021-02-08 | Disposition: A | Discharge status: Home / Self Care | Payer: MEDICAID | Attending: Emergency Medicine

## 2021-02-08 ENCOUNTER — Emergency Department
Admit: 2021-02-08 | Discharge: 2021-02-08 | Disposition: A | Discharge status: Home / Self Care | Payer: MEDICAID | Attending: Emergency Medicine

## 2021-02-08 DIAGNOSIS — K08409 Partial loss of teeth, unspecified cause, unspecified class: Principal | ICD-10-CM

## 2021-02-08 DIAGNOSIS — G8929 Other chronic pain: Principal | ICD-10-CM

## 2021-02-08 DIAGNOSIS — M25512 Pain in left shoulder: Principal | ICD-10-CM

## 2021-02-08 DIAGNOSIS — B20 Human immunodeficiency virus [HIV] disease: Principal | ICD-10-CM

## 2021-02-08 MED ORDER — LIDOCAINE 5 % TOPICAL PATCH
MEDICATED_PATCH | TRANSDERMAL | 0 refills | 30 days | Status: CP
Start: 2021-02-08 — End: 2021-03-10
  Filled 2021-02-09: qty 30, 30d supply, fill #0

## 2021-02-08 MED ORDER — NAPROXEN 500 MG TABLET
ORAL_TABLET | Freq: Two times a day (BID) | ORAL | 0 refills | 15 days | Status: CP
Start: 2021-02-08 — End: 2021-02-23
  Filled 2021-02-08: qty 30, 15d supply, fill #0

## 2021-02-08 MED ORDER — GABAPENTIN 300 MG CAPSULE
ORAL_CAPSULE | Freq: Every evening | ORAL | 0 refills | 30 days | Status: CP
Start: 2021-02-08 — End: 2021-03-10
  Filled 2021-02-09: qty 30, 30d supply, fill #0

## 2021-02-09 MED FILL — ALBUTEROL SULFATE HFA 90 MCG/ACTUATION AEROSOL INHALER: RESPIRATORY_TRACT | 17 days supply | Qty: 18 | Fill #6

## 2021-02-15 ENCOUNTER — Ambulatory Visit: Admit: 2021-02-15 | Discharge: 2021-02-16 | Payer: MEDICAID | Attending: Medical | Primary: Medical

## 2021-02-15 DIAGNOSIS — F17200 Nicotine dependence, unspecified, uncomplicated: Principal | ICD-10-CM

## 2021-02-15 DIAGNOSIS — H9201 Otalgia, right ear: Principal | ICD-10-CM

## 2021-02-15 DIAGNOSIS — E669 Obesity, unspecified: Principal | ICD-10-CM

## 2021-02-15 DIAGNOSIS — M5412 Radiculopathy, cervical region: Principal | ICD-10-CM

## 2021-02-15 DIAGNOSIS — M25551 Pain in right hip: Principal | ICD-10-CM

## 2021-02-15 DIAGNOSIS — M79622 Pain in left upper arm: Principal | ICD-10-CM

## 2021-02-15 DIAGNOSIS — F32A Depression, unspecified depression type: Principal | ICD-10-CM

## 2021-02-15 DIAGNOSIS — J309 Allergic rhinitis, unspecified: Principal | ICD-10-CM

## 2021-02-15 DIAGNOSIS — F339 Major depressive disorder, recurrent, unspecified: Principal | ICD-10-CM

## 2021-02-15 DIAGNOSIS — I10 Essential (primary) hypertension: Principal | ICD-10-CM

## 2021-02-15 DIAGNOSIS — E1169 Type 2 diabetes mellitus with other specified complication: Principal | ICD-10-CM

## 2021-02-15 DIAGNOSIS — G8929 Other chronic pain: Principal | ICD-10-CM

## 2021-02-15 DIAGNOSIS — K219 Gastro-esophageal reflux disease without esophagitis: Principal | ICD-10-CM

## 2021-02-15 DIAGNOSIS — E782 Mixed hyperlipidemia: Principal | ICD-10-CM

## 2021-02-15 MED ORDER — ATORVASTATIN 40 MG TABLET
ORAL_TABLET | Freq: Every day | ORAL | 3 refills | 90 days | Status: CP
Start: 2021-02-15 — End: ?

## 2021-02-19 MED ORDER — LANCETS 30 GAUGE
3 refills | 0 days | Status: CP
Start: 2021-02-19 — End: ?

## 2021-02-19 MED ORDER — GABAPENTIN 300 MG CAPSULE
ORAL_CAPSULE | Freq: Every evening | ORAL | 3 refills | 90 days | Status: CP
Start: 2021-02-19 — End: ?
  Filled 2021-04-09: qty 180, 90d supply, fill #0

## 2021-02-19 MED ORDER — SPIRIVA WITH HANDIHALER 18 MCG AND INHALATION CAPSULES
ORAL_CAPSULE | Freq: Every day | RESPIRATORY_TRACT | 3 refills | 90 days | Status: CP
Start: 2021-02-19 — End: 2022-02-19

## 2021-02-19 MED ORDER — ATORVASTATIN 40 MG TABLET
ORAL_TABLET | Freq: Every day | ORAL | 3 refills | 90 days | Status: CP
Start: 2021-02-19 — End: ?
  Filled 2021-04-09: qty 90, 90d supply, fill #0

## 2021-02-19 MED ORDER — ON CALL EXPRESS TEST STRIP
4 refills | 0.00000 days | Status: CP
Start: 2021-02-19 — End: ?

## 2021-02-19 MED ORDER — LIDOCAINE 5 % TOPICAL PATCH
MEDICATED_PATCH | TRANSDERMAL | 2 refills | 30 days | Status: CP
Start: 2021-02-19 — End: ?
  Filled 2021-03-13: qty 30, 30d supply, fill #0

## 2021-02-19 MED ORDER — METFORMIN 500 MG TABLET
ORAL_TABLET | Freq: Two times a day (BID) | ORAL | 3 refills | 90 days | Status: CP
Start: 2021-02-19 — End: ?
  Filled 2021-02-26: qty 180, 90d supply, fill #0

## 2021-02-19 MED ORDER — NICOTINE (POLACRILEX) 2 MG BUCCAL LOZENGE
LOZENGE | BUCCAL | 5 refills | 6 days | Status: CP | PRN
Start: 2021-02-19 — End: ?

## 2021-02-19 MED ORDER — LANSOPRAZOLE 30 MG CAPSULE,DELAYED RELEASE
ORAL_CAPSULE | Freq: Every day | ORAL | 3 refills | 90.00000 days | Status: CP
Start: 2021-02-19 — End: ?

## 2021-02-19 MED ORDER — LISINOPRIL 20 MG TABLET
ORAL_TABLET | Freq: Every day | ORAL | 3 refills | 90 days | Status: CP
Start: 2021-02-19 — End: ?
  Filled 2021-02-26: qty 90, 90d supply, fill #0

## 2021-02-19 MED ORDER — QUETIAPINE 25 MG TABLET
ORAL_TABLET | Freq: Every evening | ORAL | 3 refills | 90 days | Status: CP
Start: 2021-02-19 — End: ?
  Filled 2021-02-26: qty 90, 90d supply, fill #0

## 2021-02-26 MED FILL — CYCLOBENZAPRINE 10 MG TABLET: ORAL | 30 days supply | Qty: 60 | Fill #1

## 2021-02-26 MED FILL — ALBUTEROL SULFATE HFA 90 MCG/ACTUATION AEROSOL INHALER: RESPIRATORY_TRACT | 17 days supply | Qty: 18 | Fill #7

## 2021-03-01 MED ORDER — TRAMADOL 50 MG TABLET
ORAL_TABLET | Freq: Three times a day (TID) | ORAL | 0 refills | 7 days | Status: CP | PRN
Start: 2021-03-01 — End: ?

## 2021-03-26 ENCOUNTER — Ambulatory Visit: Admit: 2021-03-26 | Discharge: 2021-03-27 | Attending: Otolaryngology | Primary: Otolaryngology

## 2021-03-26 DIAGNOSIS — H7191 Unspecified cholesteatoma, right ear: Principal | ICD-10-CM

## 2021-03-28 ENCOUNTER — Ambulatory Visit
Admit: 2021-03-28 | Discharge: 2021-03-29 | Attending: Student in an Organized Health Care Education/Training Program | Primary: Student in an Organized Health Care Education/Training Program

## 2021-03-28 DIAGNOSIS — Z720 Tobacco use: Principal | ICD-10-CM

## 2021-03-28 DIAGNOSIS — B20 Human immunodeficiency virus [HIV] disease: Principal | ICD-10-CM

## 2021-03-28 DIAGNOSIS — M549 Dorsalgia, unspecified: Principal | ICD-10-CM

## 2021-03-28 DIAGNOSIS — H7190 Unspecified cholesteatoma, unspecified ear: Principal | ICD-10-CM

## 2021-03-30 MED ORDER — TRAMADOL 50 MG TABLET
ORAL_TABLET | Freq: Three times a day (TID) | ORAL | 0 refills | 7 days | Status: CP | PRN
Start: 2021-03-30 — End: 2021-04-29

## 2021-04-02 DIAGNOSIS — M79622 Pain in left upper arm: Principal | ICD-10-CM

## 2021-04-03 ENCOUNTER — Ambulatory Visit: Admit: 2021-04-03 | Discharge: 2021-04-04 | Payer: MEDICAID | Attending: Medical | Primary: Medical

## 2021-04-03 DIAGNOSIS — M79622 Pain in left upper arm: Principal | ICD-10-CM

## 2021-04-03 DIAGNOSIS — J449 Chronic obstructive pulmonary disease, unspecified: Principal | ICD-10-CM

## 2021-04-03 DIAGNOSIS — G8929 Other chronic pain: Principal | ICD-10-CM

## 2021-04-03 DIAGNOSIS — M25551 Pain in right hip: Principal | ICD-10-CM

## 2021-04-03 DIAGNOSIS — I10 Essential (primary) hypertension: Principal | ICD-10-CM

## 2021-04-03 DIAGNOSIS — M5412 Radiculopathy, cervical region: Principal | ICD-10-CM

## 2021-04-03 MED ORDER — TRAMADOL 50 MG TABLET
ORAL_TABLET | Freq: Three times a day (TID) | ORAL | 0 refills | 15.00000 days | Status: CP | PRN
Start: 2021-04-03 — End: 2021-05-03

## 2021-04-03 MED ORDER — CYCLOBENZAPRINE 10 MG TABLET
ORAL_TABLET | Freq: Two times a day (BID) | ORAL | 1 refills | 30.00000 days | Status: CP | PRN
Start: 2021-04-03 — End: ?

## 2021-04-06 DIAGNOSIS — J449 Chronic obstructive pulmonary disease, unspecified: Principal | ICD-10-CM

## 2021-04-06 DIAGNOSIS — Z6839 Body mass index (BMI) 39.0-39.9, adult: Principal | ICD-10-CM

## 2021-04-06 DIAGNOSIS — H7191 Unspecified cholesteatoma, right ear: Principal | ICD-10-CM

## 2021-04-09 MED FILL — FLUTICASONE PROPIONATE 50 MCG/ACTUATION NASAL SPRAY,SUSPENSION: NASAL | 30 days supply | Qty: 16 | Fill #5

## 2021-04-09 MED FILL — ALBUTEROL SULFATE HFA 90 MCG/ACTUATION AEROSOL INHALER: RESPIRATORY_TRACT | 17 days supply | Qty: 18 | Fill #8

## 2021-04-24 DIAGNOSIS — G8929 Other chronic pain: Principal | ICD-10-CM

## 2021-04-26 MED ORDER — TRAMADOL 50 MG TABLET
ORAL_TABLET | Freq: Three times a day (TID) | ORAL | 0 refills | 15.00000 days | Status: CP | PRN
Start: 2021-04-26 — End: 2021-05-26
  Filled 2021-05-01: qty 90, 15d supply, fill #0

## 2021-05-11 ENCOUNTER — Ambulatory Visit: Admit: 2021-05-11

## 2021-05-15 ENCOUNTER — Ambulatory Visit: Admit: 2021-05-15 | Discharge: 2021-05-16

## 2021-05-15 DIAGNOSIS — B20 Human immunodeficiency virus [HIV] disease: Principal | ICD-10-CM

## 2021-05-15 DIAGNOSIS — G8929 Other chronic pain: Principal | ICD-10-CM

## 2021-05-15 DIAGNOSIS — E119 Type 2 diabetes mellitus without complications: Principal | ICD-10-CM

## 2021-05-15 DIAGNOSIS — Z01818 Encounter for other preprocedural examination: Principal | ICD-10-CM

## 2021-05-15 DIAGNOSIS — I10 Essential (primary) hypertension: Principal | ICD-10-CM

## 2021-05-15 DIAGNOSIS — Z6839 Body mass index (BMI) 39.0-39.9, adult: Principal | ICD-10-CM

## 2021-05-15 DIAGNOSIS — J449 Chronic obstructive pulmonary disease, unspecified: Principal | ICD-10-CM

## 2021-05-15 DIAGNOSIS — H7191 Unspecified cholesteatoma, right ear: Principal | ICD-10-CM

## 2021-05-22 DIAGNOSIS — F17200 Nicotine dependence, unspecified, uncomplicated: Principal | ICD-10-CM

## 2021-05-23 MED ORDER — ALBUTEROL SULFATE HFA 90 MCG/ACTUATION AEROSOL INHALER
RESPIRATORY_TRACT | 1 refills | 0.00000 days | Status: CP | PRN
Start: 2021-05-23 — End: ?
  Filled 2021-05-24: qty 18, 17d supply, fill #0

## 2021-05-24 ENCOUNTER — Ambulatory Visit: Admit: 2021-05-24 | Discharge: 2021-05-28 | Payer: MEDICAID

## 2021-05-24 ENCOUNTER — Encounter
Admit: 2021-05-24 | Discharge: 2021-05-28 | Attending: Student in an Organized Health Care Education/Training Program | Primary: Student in an Organized Health Care Education/Training Program

## 2021-05-24 MED ORDER — OXYCODONE 5 MG TABLET
ORAL_TABLET | ORAL | 0 refills | 1.00000 days | Status: CP | PRN
Start: 2021-05-24 — End: 2021-05-29
  Filled 2021-05-25: qty 10, 2d supply, fill #0
  Filled 2021-05-25: qty 10, 1d supply, fill #0

## 2021-05-24 MED ORDER — NEOMYCIN-POLYMYXIN-HYDROCORT 3.5 MG/ML-10,000 UNIT/ML-1 % EAR SOLUTION
Freq: Two times a day (BID) | OTIC | 3 refills | 0.00000 days | Status: CP
Start: 2021-05-24 — End: 2021-07-23
  Filled 2021-05-25: qty 10, 33d supply, fill #0

## 2021-05-24 MED FILL — QUETIAPINE 25 MG TABLET: ORAL | 90 days supply | Qty: 90 | Fill #1

## 2021-05-24 MED FILL — FLUTICASONE PROPIONATE 50 MCG/ACTUATION NASAL SPRAY,SUSPENSION: NASAL | 30 days supply | Qty: 16 | Fill #6

## 2021-05-24 MED FILL — METFORMIN 500 MG TABLET: ORAL | 90 days supply | Qty: 180 | Fill #1

## 2021-05-24 MED FILL — LIDOCAINE 5 % TOPICAL PATCH: TRANSDERMAL | 30 days supply | Qty: 30 | Fill #1

## 2021-05-24 MED FILL — LISINOPRIL 20 MG TABLET: ORAL | 90 days supply | Qty: 90 | Fill #1

## 2021-05-24 MED FILL — TRAMADOL 50 MG TABLET: ORAL | 15 days supply | Qty: 90 | Fill #0

## 2021-05-28 DIAGNOSIS — M79622 Pain in left upper arm: Principal | ICD-10-CM

## 2021-05-28 MED ORDER — OXYCODONE 5 MG TABLET
ORAL_TABLET | ORAL | 0 refills | 5.00000 days | Status: CP | PRN
Start: 2021-05-28 — End: ?
  Filled 2021-05-29: qty 25, 4d supply, fill #0

## 2021-05-29 ENCOUNTER — Ambulatory Visit: Admit: 2021-05-29 | Discharge: 2021-05-30 | Payer: MEDICAID | Attending: Medical | Primary: Medical

## 2021-05-29 ENCOUNTER — Ambulatory Visit: Admit: 2021-05-29 | Discharge: 2021-05-30 | Payer: MEDICAID

## 2021-05-29 DIAGNOSIS — F17200 Nicotine dependence, unspecified, uncomplicated: Principal | ICD-10-CM

## 2021-05-29 DIAGNOSIS — E669 Obesity, unspecified: Principal | ICD-10-CM

## 2021-05-29 DIAGNOSIS — E1169 Type 2 diabetes mellitus with other specified complication: Principal | ICD-10-CM

## 2021-05-29 DIAGNOSIS — M79671 Pain in right foot: Principal | ICD-10-CM

## 2021-05-29 DIAGNOSIS — I10 Essential (primary) hypertension: Principal | ICD-10-CM

## 2021-05-29 DIAGNOSIS — G8929 Other chronic pain: Principal | ICD-10-CM

## 2021-05-29 MED ORDER — ALBUTEROL SULFATE HFA 90 MCG/ACTUATION AEROSOL INHALER
RESPIRATORY_TRACT | 1 refills | 0.00000 days | Status: CP | PRN
Start: 2021-05-29 — End: ?
  Filled 2021-07-24: qty 8.5, 17d supply, fill #0

## 2021-05-29 MED ORDER — CYCLOBENZAPRINE 10 MG TABLET
ORAL_TABLET | Freq: Three times a day (TID) | ORAL | 2 refills | 20 days | Status: CP
Start: 2021-05-29 — End: ?
  Filled 2021-05-30: qty 60, 20d supply, fill #0

## 2021-06-07 DIAGNOSIS — M25551 Pain in right hip: Principal | ICD-10-CM

## 2021-06-15 ENCOUNTER — Ambulatory Visit
Admit: 2021-06-15 | Discharge: 2021-06-16 | Payer: MEDICAID | Attending: Emergency Medicine | Primary: Emergency Medicine

## 2021-06-15 MED ORDER — OXYCODONE 5 MG TABLET
ORAL_TABLET | ORAL | 0 refills | 4 days | Status: CP | PRN
Start: 2021-06-15 — End: ?
  Filled 2021-06-15: qty 20, 4d supply, fill #0

## 2021-06-21 DIAGNOSIS — M25551 Pain in right hip: Principal | ICD-10-CM

## 2021-06-21 MED ORDER — TRAMADOL 50 MG TABLET
ORAL_TABLET | Freq: Three times a day (TID) | ORAL | 0 refills | 15 days | Status: CP | PRN
Start: 2021-06-21 — End: ?
  Filled 2021-06-25: qty 90, 15d supply, fill #0

## 2021-06-25 ENCOUNTER — Ambulatory Visit: Admit: 2021-06-25 | Discharge: 2021-06-26 | Payer: MEDICAID | Attending: Otolaryngology | Primary: Otolaryngology

## 2021-06-25 MED FILL — CYCLOBENZAPRINE 10 MG TABLET: ORAL | 20 days supply | Qty: 60 | Fill #1

## 2021-07-04 ENCOUNTER — Ambulatory Visit
Admit: 2021-07-04 | Attending: Student in an Organized Health Care Education/Training Program | Primary: Student in an Organized Health Care Education/Training Program

## 2021-07-23 ENCOUNTER — Ambulatory Visit: Admit: 2021-07-23 | Discharge: 2021-07-24 | Attending: Otolaryngology | Primary: Otolaryngology

## 2021-07-23 DIAGNOSIS — H7191 Unspecified cholesteatoma, right ear: Principal | ICD-10-CM

## 2021-07-24 ENCOUNTER — Ambulatory Visit: Admit: 2021-07-24 | Discharge: 2021-07-24

## 2021-07-24 MED ORDER — TRAMADOL 50 MG TABLET
ORAL_TABLET | Freq: Three times a day (TID) | ORAL | 0 refills | 15 days | Status: CP | PRN
Start: 2021-07-24 — End: ?
  Filled 2021-07-24: qty 90, 15d supply, fill #0

## 2021-07-24 MED FILL — FLUTICASONE PROPIONATE 50 MCG/ACTUATION NASAL SPRAY,SUSPENSION: NASAL | 30 days supply | Qty: 16 | Fill #7

## 2021-07-24 MED FILL — ATORVASTATIN 40 MG TABLET: ORAL | 90 days supply | Qty: 90 | Fill #1

## 2021-07-24 MED FILL — CYCLOBENZAPRINE 10 MG TABLET: ORAL | 20 days supply | Qty: 60 | Fill #2

## 2021-07-24 MED FILL — LIDOCAINE 5 % TOPICAL PATCH: TRANSDERMAL | 30 days supply | Qty: 30 | Fill #2

## 2021-07-25 ENCOUNTER — Ambulatory Visit: Admit: 2021-07-25 | Discharge: 2021-07-25 | Attending: Registered" | Primary: Registered"

## 2021-07-25 ENCOUNTER — Ambulatory Visit
Admit: 2021-07-25 | Discharge: 2021-07-25 | Attending: Student in an Organized Health Care Education/Training Program | Primary: Student in an Organized Health Care Education/Training Program

## 2021-07-25 DIAGNOSIS — E669 Obesity, unspecified: Principal | ICD-10-CM

## 2021-07-25 DIAGNOSIS — B20 Human immunodeficiency virus [HIV] disease: Principal | ICD-10-CM

## 2021-07-25 MED ORDER — NICOTINE (POLACRILEX) 2 MG BUCCAL LOZENGE
LOZENGE | BUCCAL | 5 refills | 6 days | Status: CP | PRN
Start: 2021-07-25 — End: ?

## 2021-07-30 ENCOUNTER — Ambulatory Visit: Admit: 2021-07-30 | Discharge: 2021-07-31

## 2021-07-30 DIAGNOSIS — Z122 Encounter for screening for malignant neoplasm of respiratory organs: Principal | ICD-10-CM

## 2021-07-30 DIAGNOSIS — F17200 Nicotine dependence, unspecified, uncomplicated: Principal | ICD-10-CM

## 2021-07-30 DIAGNOSIS — J449 Chronic obstructive pulmonary disease, unspecified: Principal | ICD-10-CM

## 2021-07-30 MED ORDER — SPIRIVA WITH HANDIHALER 18 MCG AND INHALATION CAPSULES
ORAL_CAPSULE | Freq: Every day | RESPIRATORY_TRACT | 3 refills | 90 days | Status: CP
Start: 2021-07-30 — End: 2022-07-30

## 2021-07-30 MED ORDER — NICOTINE (POLACRILEX) 2 MG BUCCAL LOZENGE
LOZENGE | BUCCAL | 5 refills | 6 days | Status: CP | PRN
Start: 2021-07-30 — End: ?
  Filled 2021-07-30: qty 72, 6d supply, fill #0

## 2021-08-02 ENCOUNTER — Ambulatory Visit: Admit: 2021-08-02

## 2021-08-03 DIAGNOSIS — B20 Human immunodeficiency virus [HIV] disease: Principal | ICD-10-CM

## 2021-08-03 MED ORDER — BIKTARVY 50 MG-200 MG-25 MG TABLET
ORAL_TABLET | Freq: Every day | ORAL | 1 refills | 30 days | Status: CP
Start: 2021-08-03 — End: ?

## 2021-08-08 ENCOUNTER — Ambulatory Visit
Admit: 2021-08-08 | Discharge: 2021-08-09 | Payer: MEDICAID | Attending: Emergency Medicine | Primary: Emergency Medicine

## 2021-08-09 ENCOUNTER — Ambulatory Visit: Admit: 2021-08-09 | Payer: MEDICAID

## 2021-08-27 ENCOUNTER — Ambulatory Visit: Admit: 2021-08-27 | Discharge: 2021-08-28 | Attending: Medical | Primary: Medical

## 2021-08-27 ENCOUNTER — Ambulatory Visit: Admit: 2021-08-27 | Discharge: 2021-08-28 | Attending: Otolaryngology | Primary: Otolaryngology

## 2021-08-27 DIAGNOSIS — M25551 Pain in right hip: Principal | ICD-10-CM

## 2021-08-27 DIAGNOSIS — R630 Anorexia: Principal | ICD-10-CM

## 2021-08-27 DIAGNOSIS — Z0000 Encounter for general adult medical examination without abnormal findings: Principal | ICD-10-CM

## 2021-08-27 DIAGNOSIS — B20 Human immunodeficiency virus [HIV] disease: Principal | ICD-10-CM

## 2021-08-27 DIAGNOSIS — E669 Obesity, unspecified: Principal | ICD-10-CM

## 2021-08-27 DIAGNOSIS — I10 Essential (primary) hypertension: Principal | ICD-10-CM

## 2021-08-27 DIAGNOSIS — G8929 Other chronic pain: Principal | ICD-10-CM

## 2021-08-27 DIAGNOSIS — E1169 Type 2 diabetes mellitus with other specified complication: Principal | ICD-10-CM

## 2021-08-27 MED ORDER — LIDOCAINE 5 % TOPICAL PATCH
MEDICATED_PATCH | TRANSDERMAL | 2 refills | 30 days | Status: CP
Start: 2021-08-27 — End: ?
  Filled 2021-08-31: qty 90, 90d supply, fill #0

## 2021-08-27 MED ORDER — TRAMADOL 50 MG TABLET
ORAL_TABLET | Freq: Three times a day (TID) | ORAL | 0 refills | 15 days | Status: CP | PRN
Start: 2021-08-27 — End: ?
  Filled 2021-08-31: qty 90, 15d supply, fill #0

## 2021-08-27 MED ORDER — CETIRIZINE 10 MG TABLET
ORAL_TABLET | Freq: Every day | ORAL | 3 refills | 90.00000 days | Status: CP
Start: 2021-08-27 — End: 2021-08-27

## 2021-08-27 MED ORDER — CYCLOBENZAPRINE 10 MG TABLET
ORAL_TABLET | Freq: Three times a day (TID) | ORAL | 2 refills | 20 days | Status: CP
Start: 2021-08-27 — End: ?
  Filled 2021-08-31: qty 60, 20d supply, fill #0

## 2021-08-29 ENCOUNTER — Telehealth: Admit: 2021-08-29

## 2021-08-29 ENCOUNTER — Ambulatory Visit: Admit: 2021-08-29 | Attending: Ophthalmology | Primary: Ophthalmology

## 2021-08-31 MED FILL — LISINOPRIL 20 MG TABLET: ORAL | 90 days supply | Qty: 90 | Fill #2

## 2021-08-31 MED FILL — QUETIAPINE 25 MG TABLET: ORAL | 90 days supply | Qty: 90 | Fill #2

## 2021-08-31 MED FILL — METFORMIN 500 MG TABLET: ORAL | 90 days supply | Qty: 180 | Fill #2

## 2021-09-13 DIAGNOSIS — K219 Gastro-esophageal reflux disease without esophagitis: Principal | ICD-10-CM

## 2021-09-13 MED ORDER — LANSOPRAZOLE 30 MG CAPSULE,DELAYED RELEASE
ORAL_CAPSULE | 3 refills | 0 days | Status: CP
Start: 2021-09-13 — End: ?

## 2021-09-14 DIAGNOSIS — B20 Human immunodeficiency virus [HIV] disease: Principal | ICD-10-CM

## 2021-09-14 MED ORDER — BIKTARVY 50 MG-200 MG-25 MG TABLET
ORAL_TABLET | Freq: Every day | ORAL | 4 refills | 30 days | Status: CP
Start: 2021-09-14 — End: ?

## 2021-09-19 ENCOUNTER — Ambulatory Visit: Admit: 2021-09-19 | Discharge: 2021-09-20

## 2021-09-19 DIAGNOSIS — Z91199 No-show for appointment: Principal | ICD-10-CM

## 2021-09-21 ENCOUNTER — Institutional Professional Consult (permissible substitution): Admit: 2021-09-21

## 2021-09-25 ENCOUNTER — Institutional Professional Consult (permissible substitution): Admit: 2021-09-25 | Discharge: 2021-09-26

## 2021-09-27 ENCOUNTER — Institutional Professional Consult (permissible substitution): Admit: 2021-09-27 | Discharge: 2021-09-28 | Attending: Registered" | Primary: Registered"

## 2021-09-27 DIAGNOSIS — Z713 Dietary counseling and surveillance: Principal | ICD-10-CM

## 2021-10-10 DIAGNOSIS — G8929 Other chronic pain: Principal | ICD-10-CM

## 2021-10-10 DIAGNOSIS — J309 Allergic rhinitis, unspecified: Principal | ICD-10-CM

## 2021-10-10 DIAGNOSIS — M25551 Pain in right hip: Principal | ICD-10-CM

## 2021-10-11 MED ORDER — FLUTICASONE PROPIONATE 50 MCG/ACTUATION NASAL SPRAY,SUSPENSION
Freq: Every day | NASAL | 11 refills | 60 days | Status: CP
Start: 2021-10-11 — End: ?
  Filled 2021-10-12: qty 16, 30d supply, fill #0

## 2021-10-11 MED ORDER — LIDOCAINE 5 % TOPICAL PATCH
MEDICATED_PATCH | TRANSDERMAL | 2 refills | 30 days | Status: CP
Start: 2021-10-11 — End: ?
  Filled 2021-11-26: qty 30, 30d supply, fill #0

## 2021-10-11 MED ORDER — TRAMADOL 50 MG TABLET
ORAL_TABLET | Freq: Three times a day (TID) | ORAL | 0 refills | 15 days | Status: CP | PRN
Start: 2021-10-11 — End: ?
  Filled 2021-10-12: qty 90, 15d supply, fill #0

## 2021-10-23 ENCOUNTER — Institutional Professional Consult (permissible substitution): Admit: 2021-10-23 | Discharge: 2021-10-24

## 2021-10-23 MED ORDER — NICOTINE (POLACRILEX) 4 MG BUCCAL LOZENGE
BUCCAL | 1 refills | 18 days | Status: CP | PRN
Start: 2021-10-23 — End: ?
  Filled 2021-11-26: qty 72, 12d supply, fill #0

## 2021-10-24 MED FILL — CYCLOBENZAPRINE 10 MG TABLET: ORAL | 20 days supply | Qty: 60 | Fill #1

## 2021-10-29 ENCOUNTER — Institutional Professional Consult (permissible substitution): Admit: 2021-10-29 | Discharge: 2021-10-30 | Attending: Registered" | Primary: Registered"

## 2021-11-22 ENCOUNTER — Institutional Professional Consult (permissible substitution): Admit: 2021-11-22 | Discharge: 2021-11-23

## 2021-11-26 ENCOUNTER — Ambulatory Visit: Admit: 2021-11-26 | Attending: Registered" | Primary: Registered"

## 2021-11-26 MED FILL — QUETIAPINE 25 MG TABLET: ORAL | 90 days supply | Qty: 90 | Fill #3

## 2021-11-26 MED FILL — LISINOPRIL 20 MG TABLET: ORAL | 90 days supply | Qty: 90 | Fill #3

## 2021-11-26 MED FILL — ATORVASTATIN 40 MG TABLET: ORAL | 90 days supply | Qty: 90 | Fill #2

## 2021-11-26 MED FILL — FLUTICASONE PROPIONATE 50 MCG/ACTUATION NASAL SPRAY,SUSPENSION: NASAL | 30 days supply | Qty: 16 | Fill #1

## 2021-11-26 MED FILL — METFORMIN 500 MG TABLET: ORAL | 90 days supply | Qty: 180 | Fill #3

## 2021-11-26 MED FILL — CYCLOBENZAPRINE 10 MG TABLET: ORAL | 20 days supply | Qty: 60 | Fill #2

## 2021-11-26 MED FILL — ALBUTEROL SULFATE HFA 90 MCG/ACTUATION AEROSOL INHALER: RESPIRATORY_TRACT | 17 days supply | Qty: 8.5 | Fill #1

## 2021-11-28 ENCOUNTER — Ambulatory Visit
Admit: 2021-11-28 | Discharge: 2021-11-29 | Attending: Student in an Organized Health Care Education/Training Program | Primary: Student in an Organized Health Care Education/Training Program

## 2021-12-04 ENCOUNTER — Ambulatory Visit: Admit: 2021-12-04 | Payer: MEDICAID | Attending: Medical | Primary: Medical

## 2021-12-05 DIAGNOSIS — F339 Major depressive disorder, recurrent, unspecified: Principal | ICD-10-CM

## 2021-12-10 DIAGNOSIS — M25551 Pain in right hip: Principal | ICD-10-CM

## 2021-12-10 MED ORDER — TRAMADOL 50 MG TABLET
ORAL_TABLET | Freq: Three times a day (TID) | ORAL | 0 refills | 15 days | Status: CP | PRN
Start: 2021-12-10 — End: ?
  Filled 2021-12-12: qty 90, 15d supply, fill #0

## 2021-12-13 ENCOUNTER — Ambulatory Visit: Admit: 2021-12-13 | Payer: MEDICAID | Attending: Medical | Primary: Medical

## 2021-12-17 ENCOUNTER — Institutional Professional Consult (permissible substitution): Admit: 2021-12-17 | Discharge: 2021-12-18 | Payer: MEDICAID | Attending: Registered" | Primary: Registered"

## 2021-12-17 DIAGNOSIS — E669 Obesity, unspecified: Principal | ICD-10-CM

## 2021-12-18 ENCOUNTER — Ambulatory Visit: Admit: 2021-12-18 | Discharge: 2021-12-19 | Payer: MEDICAID | Attending: Medical | Primary: Medical

## 2021-12-18 DIAGNOSIS — J449 Chronic obstructive pulmonary disease, unspecified: Principal | ICD-10-CM

## 2021-12-18 DIAGNOSIS — I10 Essential (primary) hypertension: Principal | ICD-10-CM

## 2021-12-18 DIAGNOSIS — H7191 Unspecified cholesteatoma, right ear: Principal | ICD-10-CM

## 2021-12-18 DIAGNOSIS — E0590 Thyrotoxicosis, unspecified without thyrotoxic crisis or storm: Principal | ICD-10-CM

## 2021-12-18 DIAGNOSIS — Z720 Tobacco use: Principal | ICD-10-CM

## 2021-12-18 DIAGNOSIS — Z13220 Encounter for screening for lipoid disorders: Principal | ICD-10-CM

## 2021-12-18 DIAGNOSIS — E1169 Type 2 diabetes mellitus with other specified complication: Principal | ICD-10-CM

## 2021-12-18 DIAGNOSIS — E782 Mixed hyperlipidemia: Principal | ICD-10-CM

## 2021-12-18 DIAGNOSIS — F32A Depression, unspecified depression type: Principal | ICD-10-CM

## 2021-12-18 DIAGNOSIS — E669 Obesity, unspecified: Principal | ICD-10-CM

## 2021-12-18 DIAGNOSIS — Z0000 Encounter for general adult medical examination without abnormal findings: Principal | ICD-10-CM

## 2021-12-18 DIAGNOSIS — Z1211 Encounter for screening for malignant neoplasm of colon: Principal | ICD-10-CM

## 2021-12-18 DIAGNOSIS — K219 Gastro-esophageal reflux disease without esophagitis: Principal | ICD-10-CM

## 2021-12-18 DIAGNOSIS — F17200 Nicotine dependence, unspecified, uncomplicated: Principal | ICD-10-CM

## 2021-12-18 DIAGNOSIS — Z8639 Personal history of other endocrine, nutritional and metabolic disease: Principal | ICD-10-CM

## 2021-12-18 DIAGNOSIS — B20 Human immunodeficiency virus [HIV] disease: Principal | ICD-10-CM

## 2021-12-18 DIAGNOSIS — M25551 Pain in right hip: Principal | ICD-10-CM

## 2021-12-18 DIAGNOSIS — M25569 Pain in unspecified knee: Principal | ICD-10-CM

## 2021-12-18 DIAGNOSIS — G8929 Other chronic pain: Principal | ICD-10-CM

## 2021-12-18 MED ORDER — LANCETS 30 GAUGE
3 refills | 0 days | Status: CP
Start: 2021-12-18 — End: ?

## 2021-12-18 MED ORDER — ON CALL EXPRESS TEST STRIP
4 refills | 0 days | Status: CP
Start: 2021-12-18 — End: ?
  Filled 2021-12-28: qty 200, 100d supply, fill #0

## 2021-12-18 MED ORDER — LISINOPRIL 20 MG TABLET
ORAL_TABLET | Freq: Every day | ORAL | 3 refills | 90 days | Status: CP
Start: 2021-12-18 — End: ?

## 2021-12-18 MED ORDER — METFORMIN 500 MG TABLET
ORAL_TABLET | Freq: Two times a day (BID) | ORAL | 3 refills | 90 days | Status: CP
Start: 2021-12-18 — End: ?

## 2021-12-18 MED ORDER — ATORVASTATIN 40 MG TABLET
ORAL_TABLET | Freq: Every day | ORAL | 3 refills | 90 days | Status: CP
Start: 2021-12-18 — End: ?

## 2021-12-18 MED ORDER — ALBUTEROL SULFATE HFA 90 MCG/ACTUATION AEROSOL INHALER
RESPIRATORY_TRACT | 2 refills | 0 days | Status: CP | PRN
Start: 2021-12-18 — End: ?
  Filled 2022-01-22: qty 8.5, 17d supply, fill #0

## 2021-12-18 MED ORDER — CYCLOBENZAPRINE 10 MG TABLET
ORAL_TABLET | Freq: Three times a day (TID) | ORAL | 2 refills | 20 days | Status: CP
Start: 2021-12-18 — End: ?
  Filled 2022-01-10: qty 180, 60d supply, fill #0

## 2021-12-26 ENCOUNTER — Ambulatory Visit: Admit: 2021-12-26 | Discharge: 2021-12-27 | Attending: Registered" | Primary: Registered"

## 2021-12-26 ENCOUNTER — Ambulatory Visit
Admit: 2021-12-26 | Discharge: 2021-12-27 | Attending: Student in an Organized Health Care Education/Training Program | Primary: Student in an Organized Health Care Education/Training Program

## 2021-12-26 DIAGNOSIS — R634 Abnormal weight loss: Principal | ICD-10-CM

## 2021-12-26 DIAGNOSIS — B20 Human immunodeficiency virus [HIV] disease: Principal | ICD-10-CM

## 2021-12-26 MED ORDER — LANCETS 30 GAUGE
3 refills | 0 days | Status: CP
Start: 2021-12-26 — End: ?

## 2022-01-03 MED ORDER — TRAMADOL 50 MG TABLET
ORAL_TABLET | Freq: Three times a day (TID) | ORAL | 0 refills | 15 days | Status: CP | PRN
Start: 2022-01-03 — End: 2022-02-01
  Filled 2022-01-22: qty 90, 15d supply, fill #0

## 2022-01-22 MED FILL — FLUTICASONE PROPIONATE 50 MCG/ACTUATION NASAL SPRAY,SUSPENSION: NASAL | 30 days supply | Qty: 16 | Fill #2

## 2022-01-22 MED FILL — LIDOCAINE 5 % TOPICAL PATCH: TRANSDERMAL | 30 days supply | Qty: 30 | Fill #1

## 2022-02-01 MED ORDER — TRAMADOL 50 MG TABLET
ORAL_TABLET | Freq: Three times a day (TID) | ORAL | 0 refills | 15 days | Status: CP | PRN
Start: 2022-02-01 — End: 2022-03-02
  Filled 2022-02-15: qty 90, 15d supply, fill #0

## 2022-02-04 ENCOUNTER — Ambulatory Visit: Admit: 2022-02-04 | Payer: MEDICAID

## 2022-02-06 ENCOUNTER — Institutional Professional Consult (permissible substitution): Admit: 2022-02-06 | Discharge: 2022-02-07 | Payer: MEDICAID | Attending: Registered" | Primary: Registered"

## 2022-02-06 DIAGNOSIS — E669 Obesity, unspecified: Principal | ICD-10-CM

## 2022-02-11 DIAGNOSIS — G8929 Other chronic pain: Principal | ICD-10-CM

## 2022-02-11 MED ORDER — CYCLOBENZAPRINE 10 MG TABLET
ORAL_TABLET | Freq: Three times a day (TID) | ORAL | 2 refills | 20 days | Status: CP
Start: 2022-02-11 — End: ?
  Filled 2022-02-27: qty 60, 20d supply, fill #0

## 2022-02-15 MED FILL — ALBUTEROL SULFATE HFA 90 MCG/ACTUATION AEROSOL INHALER: RESPIRATORY_TRACT | 17 days supply | Qty: 8.5 | Fill #1

## 2022-02-19 MED FILL — FLUTICASONE PROPIONATE 50 MCG/ACTUATION NASAL SPRAY,SUSPENSION: NASAL | 30 days supply | Qty: 16 | Fill #3

## 2022-02-20 IMAGING — CT CT HEAD W/O CM
3 series · 15 of 47 positions shown, 18 images · non-contrast
Comparison: None.

CLINICAL DATA: Fall at home.

EXAM:
CT HEAD WITHOUT CONTRAST
TECHNIQUE: Contiguous axial images were obtained from the base of the skull
through the vertex without intravenous contrast.

[Series 3: head wo · axial · 0.45mm/px · z∈[+299,+424]mm · 9 of 31 slices shown, 12 images]
[im 3/31  brain]
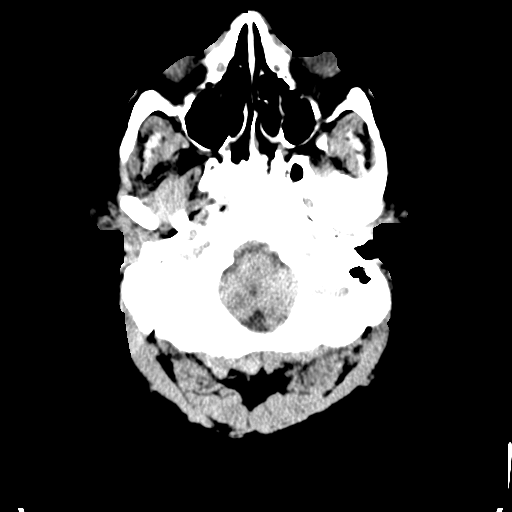
[im 3/31  bone]
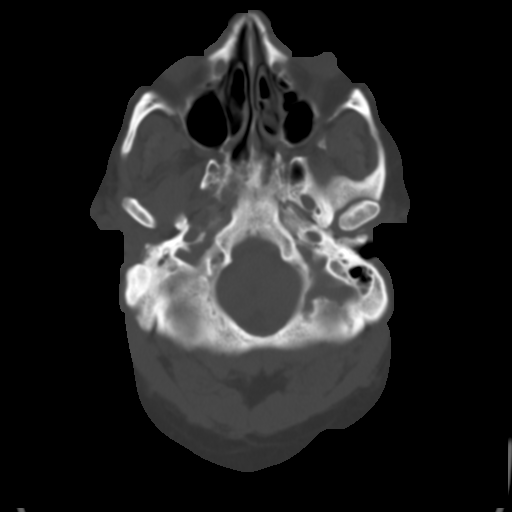
[im 6/31  brain]
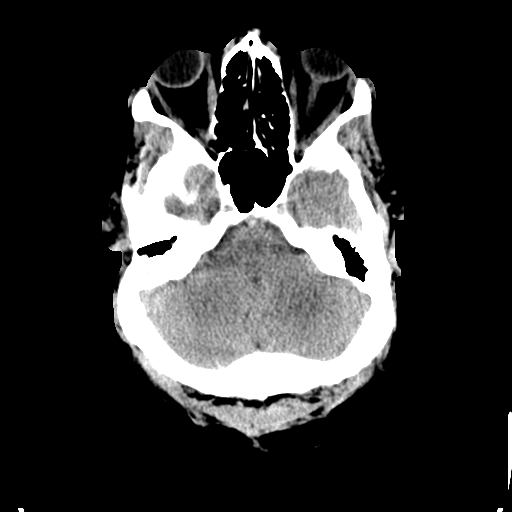
[im 9/31  brain]
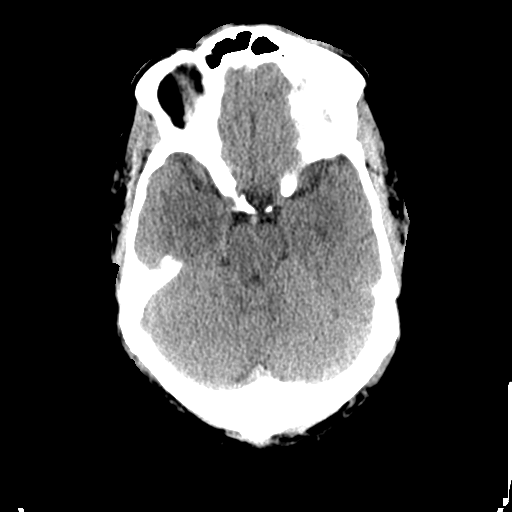
[im 12/31  brain]
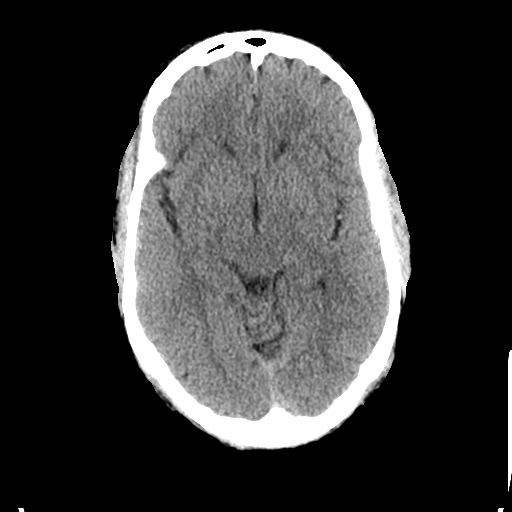
[im 16/31  brain]
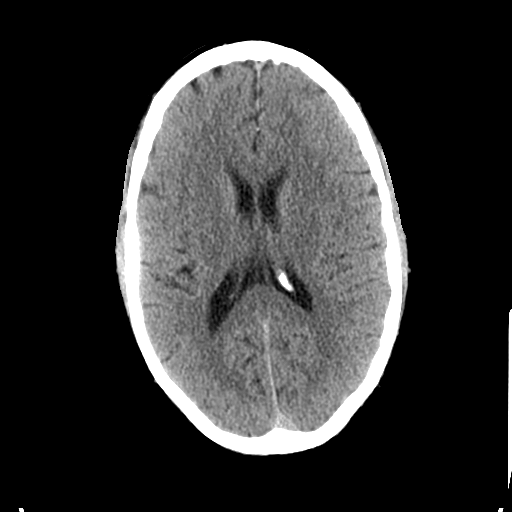
[im 16/31  bone]
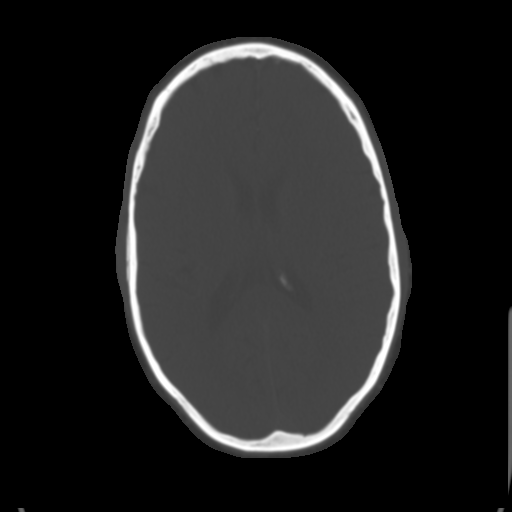
[im 19/31  brain]
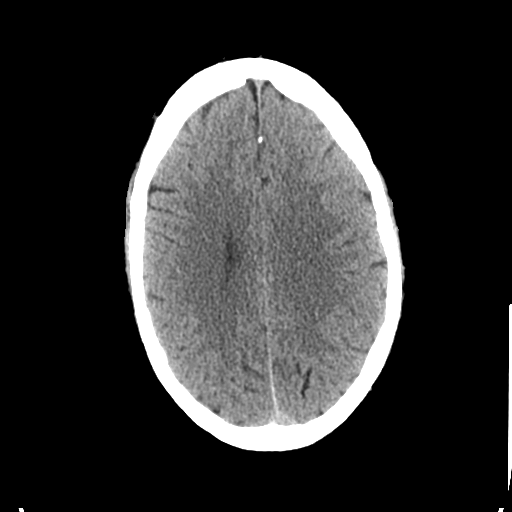
[im 22/31  brain]
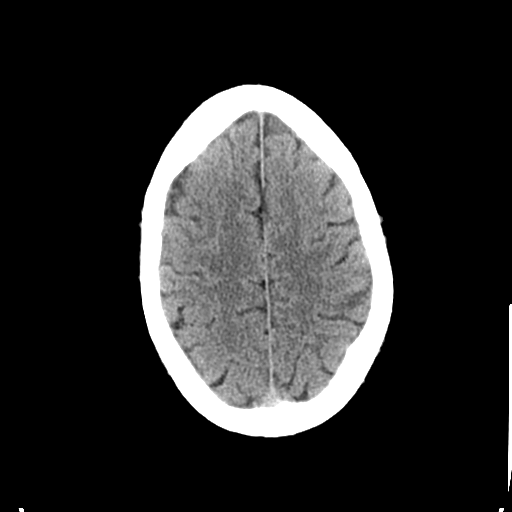
[im 25/31  brain]
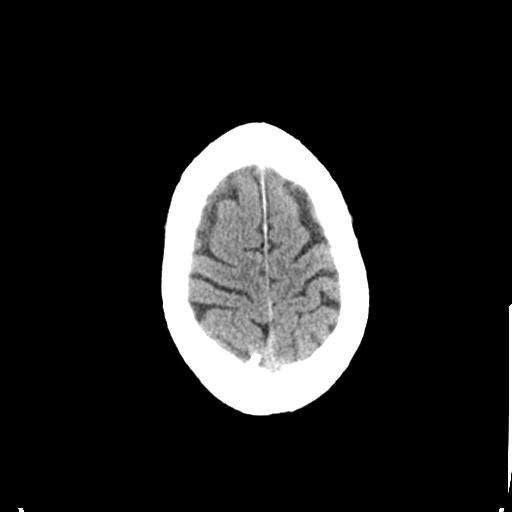
[im 28/31  brain]
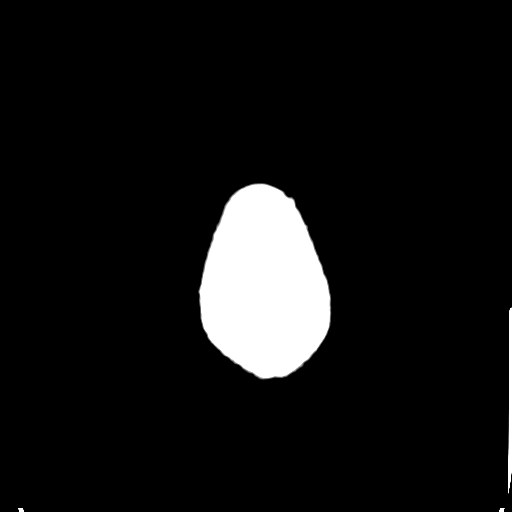
[im 28/31  bone]
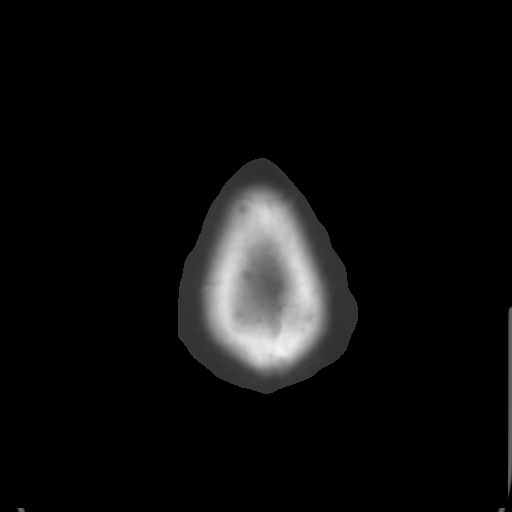

[Series 4: coronal soft tissue · coronal · 0.31mm/px · 3 of 66 slices shown]
[im 22/66  brain]
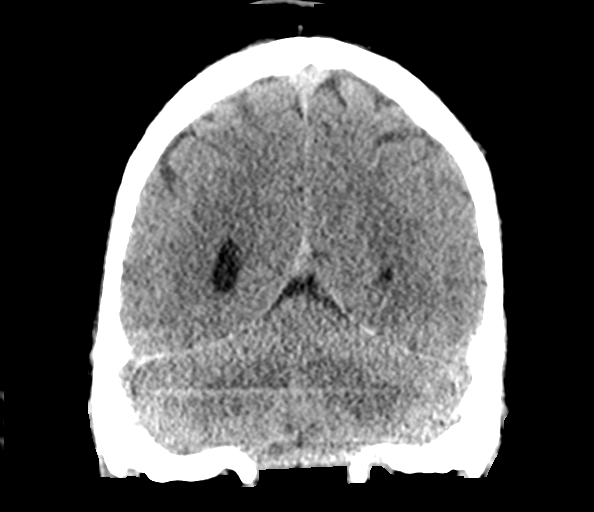
[im 29/66  brain]
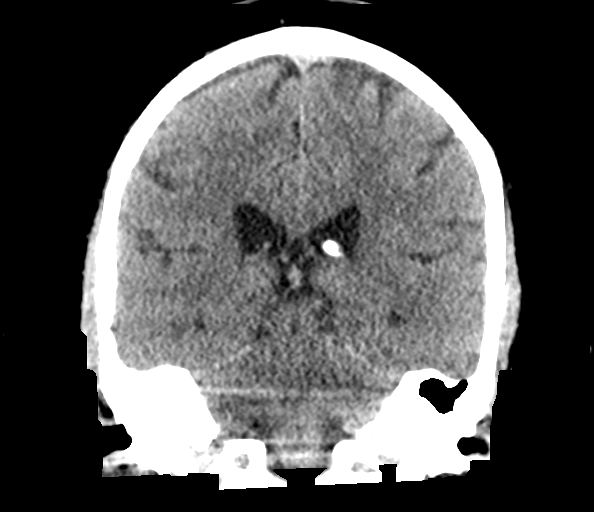
[im 37/66  brain]
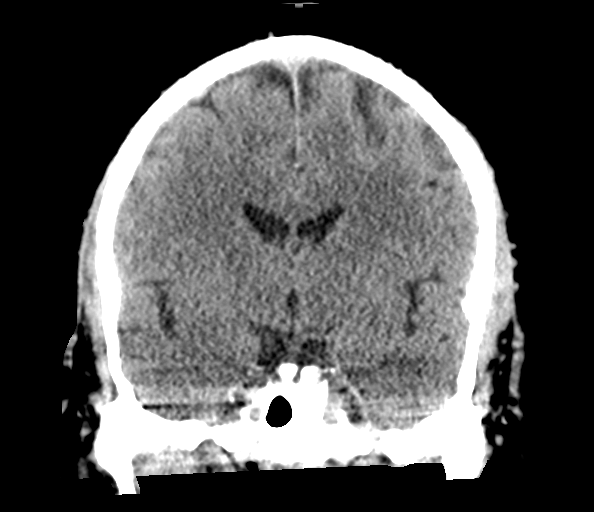

[Series 5: sagittal soft tissue · sagittal · 0.31mm/px · 3 of 50 slices shown]
[im 17/50  brain]
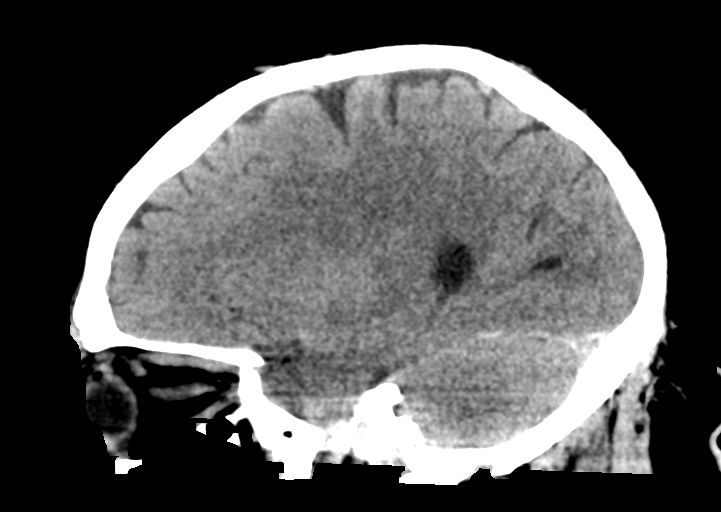
[im 25/50  brain]
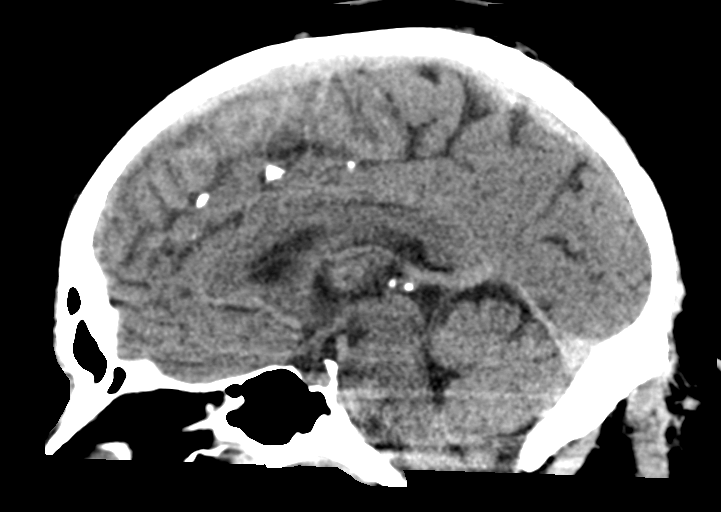
[im 33/50  brain]
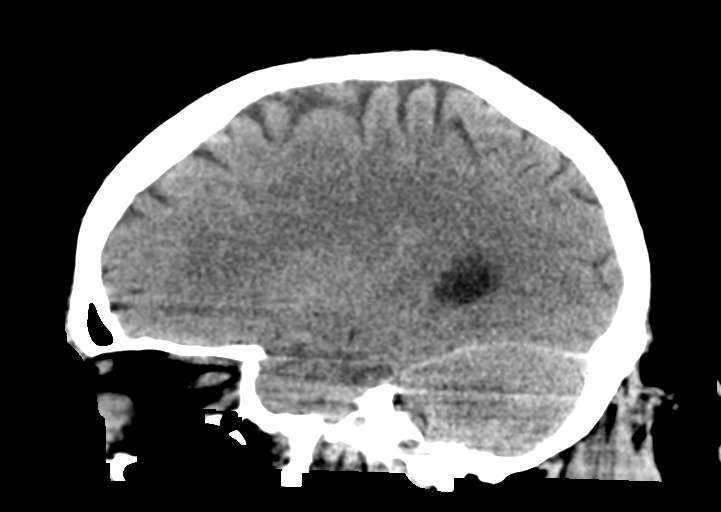

[15 of 47 positions shown; findings below may reference images not displayed]

FINDINGS: Brain: Low lying cerebellar tonsils. No intracranial hemorrhage,
mass effect, or midline shift. No hydrocephalus. The basilar
cisterns are patent. Small nonspecific parenchymal calcification in
the right temporal lobe, likely post infectious or inflammatory. No
evidence of territorial infarct or acute ischemia. No extra-axial or
intracranial fluid collection.

Vascular: No hyperdense vessel.

Skull: No fracture or focal lesion.

Sinuses/Orbits: Mastoid air cells are hypoplastic with opacification
of the lower mastoid air cells bilaterally. Minor mucosal thickening
of the paranasal sinuses. No sinus fluid levels. No evidence of
fracture. No acute orbital abnormality.

Other: None.
IMPRESSION: 1. No acute intracranial abnormality. No skull fracture.
2. Low-lying cerebellar tonsils, typically incidental but may be
Chiari 1 malformation.

## 2022-02-22 MED ORDER — CETIRIZINE 10 MG TABLET
ORAL_TABLET | Freq: Every day | ORAL | 3 refills | 90 days | Status: CP
Start: 2022-02-22 — End: 2023-02-22
  Filled 2022-02-27: qty 90, 90d supply, fill #0

## 2022-03-01 MED FILL — LIDOCAINE 5 % TOPICAL PATCH: TRANSDERMAL | 30 days supply | Qty: 30 | Fill #2

## 2022-03-02 MED ORDER — TRAMADOL 50 MG TABLET
ORAL_TABLET | Freq: Three times a day (TID) | ORAL | 0 refills | 15 days | Status: CP | PRN
Start: 2022-03-02 — End: 2022-03-31

## 2022-03-11 ENCOUNTER — Institutional Professional Consult (permissible substitution): Admit: 2022-03-11 | Discharge: 2022-03-11 | Payer: MEDICARE | Attending: Audiologist | Primary: Audiologist

## 2022-03-11 ENCOUNTER — Ambulatory Visit: Admit: 2022-03-11 | Discharge: 2022-03-11 | Payer: MEDICARE | Attending: Otolaryngology | Primary: Otolaryngology

## 2022-03-11 DIAGNOSIS — H6120 Impacted cerumen, unspecified ear: Principal | ICD-10-CM

## 2022-03-11 DIAGNOSIS — H9190 Unspecified hearing loss, unspecified ear: Principal | ICD-10-CM

## 2022-03-18 ENCOUNTER — Ambulatory Visit: Admit: 2022-03-18 | Discharge: 2022-03-19 | Payer: MEDICARE

## 2022-03-18 DIAGNOSIS — J410 Simple chronic bronchitis: Principal | ICD-10-CM

## 2022-03-18 DIAGNOSIS — F17200 Nicotine dependence, unspecified, uncomplicated: Principal | ICD-10-CM

## 2022-03-21 ENCOUNTER — Ambulatory Visit: Admit: 2022-03-21 | Discharge: 2022-03-22 | Payer: MEDICARE | Attending: Medical | Primary: Medical

## 2022-03-21 DIAGNOSIS — E669 Obesity, unspecified: Principal | ICD-10-CM

## 2022-03-21 DIAGNOSIS — Z13220 Encounter for screening for lipoid disorders: Principal | ICD-10-CM

## 2022-03-21 DIAGNOSIS — E1169 Type 2 diabetes mellitus with other specified complication: Principal | ICD-10-CM

## 2022-03-21 DIAGNOSIS — E1159 Type 2 diabetes mellitus with other circulatory complications: Principal | ICD-10-CM

## 2022-03-21 DIAGNOSIS — I152 Hypertension secondary to endocrine disorders: Principal | ICD-10-CM

## 2022-03-21 DIAGNOSIS — F331 Major depressive disorder, recurrent, moderate: Principal | ICD-10-CM

## 2022-03-21 DIAGNOSIS — B20 Human immunodeficiency virus [HIV] disease: Principal | ICD-10-CM

## 2022-03-21 DIAGNOSIS — Z8639 Personal history of other endocrine, nutritional and metabolic disease: Principal | ICD-10-CM

## 2022-03-21 DIAGNOSIS — M25551 Pain in right hip: Principal | ICD-10-CM

## 2022-03-21 DIAGNOSIS — I10 Essential (primary) hypertension: Principal | ICD-10-CM

## 2022-03-21 DIAGNOSIS — Z6839 Body mass index (BMI) 39.0-39.9, adult: Principal | ICD-10-CM

## 2022-03-21 DIAGNOSIS — G8929 Other chronic pain: Principal | ICD-10-CM

## 2022-03-21 DIAGNOSIS — Z0000 Encounter for general adult medical examination without abnormal findings: Principal | ICD-10-CM

## 2022-03-21 MED ORDER — CLONIDINE HCL 0.1 MG TABLET
ORAL_TABLET | Freq: Every evening | ORAL | 1 refills | 90 days | Status: CP
Start: 2022-03-21 — End: ?
  Filled 2022-04-05: qty 90, 90d supply, fill #0

## 2022-03-21 MED ORDER — OZEMPIC 0.25 MG OR 0.5 MG (2 MG/3 ML) SUBCUTANEOUS PEN INJECTOR
3 refills | 0 days | Status: CP
Start: 2022-03-21 — End: ?

## 2022-03-21 MED ORDER — CYCLOBENZAPRINE 10 MG TABLET
ORAL_TABLET | Freq: Three times a day (TID) | ORAL | 2 refills | 20 days | Status: CP | PRN
Start: 2022-03-21 — End: ?

## 2022-03-21 MED ORDER — TRAMADOL 50 MG TABLET
ORAL_TABLET | Freq: Three times a day (TID) | ORAL | 0 refills | 15 days | Status: CP | PRN
Start: 2022-03-21 — End: 2022-04-19
  Filled 2022-04-05: qty 42, 7d supply, fill #0

## 2022-03-21 MED ORDER — SERTRALINE 100 MG TABLET
ORAL_TABLET | Freq: Every day | ORAL | 3 refills | 90 days | Status: CP
Start: 2022-03-21 — End: ?

## 2022-03-27 DIAGNOSIS — E1169 Type 2 diabetes mellitus with other specified complication: Principal | ICD-10-CM

## 2022-03-27 DIAGNOSIS — E669 Obesity, unspecified: Principal | ICD-10-CM

## 2022-04-05 MED FILL — ALBUTEROL SULFATE HFA 90 MCG/ACTUATION AEROSOL INHALER: RESPIRATORY_TRACT | 17 days supply | Qty: 8.5 | Fill #2

## 2022-04-05 MED FILL — LISINOPRIL 20 MG TABLET: ORAL | 90 days supply | Qty: 90 | Fill #0

## 2022-04-05 MED FILL — ATORVASTATIN 40 MG TABLET: ORAL | 90 days supply | Qty: 90 | Fill #0

## 2022-04-05 MED FILL — FLUTICASONE PROPIONATE 50 MCG/ACTUATION NASAL SPRAY,SUSPENSION: NASAL | 30 days supply | Qty: 16 | Fill #4

## 2022-04-05 MED FILL — METFORMIN 500 MG TABLET: ORAL | 90 days supply | Qty: 180 | Fill #0

## 2022-04-19 MED ORDER — TRAMADOL 50 MG TABLET
ORAL_TABLET | Freq: Three times a day (TID) | ORAL | 0 refills | 15 days | Status: CP | PRN
Start: 2022-04-19 — End: 2022-05-18

## 2022-04-23 ENCOUNTER — Ambulatory Visit: Admit: 2022-04-23 | Payer: MEDICARE | Attending: Medical | Primary: Medical

## 2022-05-09 ENCOUNTER — Ambulatory Visit: Admit: 2022-05-09 | Discharge: 2022-05-10 | Payer: MEDICARE

## 2022-05-09 DIAGNOSIS — E1169 Type 2 diabetes mellitus with other specified complication: Principal | ICD-10-CM

## 2022-05-09 DIAGNOSIS — R059 Cough, unspecified type: Principal | ICD-10-CM

## 2022-05-09 DIAGNOSIS — E669 Obesity, unspecified: Principal | ICD-10-CM

## 2022-05-09 MED ORDER — OZEMPIC 0.25 MG OR 0.5 MG (2 MG/3 ML) SUBCUTANEOUS PEN INJECTOR
SUBCUTANEOUS | 3 refills | 98 days | Status: CP
Start: 2022-05-09 — End: 2023-04-24
  Filled 2022-06-17: qty 3, 42d supply, fill #0

## 2022-05-09 MED ORDER — BENZONATATE 100 MG CAPSULE
ORAL_CAPSULE | Freq: Three times a day (TID) | ORAL | 1 refills | 10 days | Status: CP | PRN
Start: 2022-05-09 — End: 2023-05-09
  Filled 2022-05-16: qty 30, 10d supply, fill #0

## 2022-05-16 MED FILL — FLUTICASONE PROPIONATE 50 MCG/ACTUATION NASAL SPRAY,SUSPENSION: NASAL | 30 days supply | Qty: 16 | Fill #5

## 2022-05-18 MED ORDER — TRAMADOL 50 MG TABLET
ORAL_TABLET | Freq: Three times a day (TID) | ORAL | 0 refills | 15 days | Status: CP | PRN
Start: 2022-05-18 — End: 2022-06-16
  Filled 2022-07-15: qty 90, 15d supply, fill #0

## 2022-06-10 DIAGNOSIS — G8929 Other chronic pain: Principal | ICD-10-CM

## 2022-06-11 DIAGNOSIS — E669 Obesity, unspecified: Principal | ICD-10-CM

## 2022-06-11 DIAGNOSIS — G8929 Other chronic pain: Principal | ICD-10-CM

## 2022-06-11 DIAGNOSIS — E1169 Type 2 diabetes mellitus with other specified complication: Principal | ICD-10-CM

## 2022-06-11 MED ORDER — LIDOCAINE 5 % TOPICAL PATCH
MEDICATED_PATCH | TRANSDERMAL | 2 refills | 30 days | Status: CP
Start: 2022-06-11 — End: ?
  Filled 2022-06-17: qty 30, 30d supply, fill #0

## 2022-06-11 MED FILL — BENZONATATE 100 MG CAPSULE: ORAL | 10 days supply | Qty: 30 | Fill #1

## 2022-06-11 MED FILL — FLUTICASONE PROPIONATE 50 MCG/ACTUATION NASAL SPRAY,SUSPENSION: NASAL | 30 days supply | Qty: 16 | Fill #6

## 2022-06-11 MED FILL — TRAMADOL 50 MG TABLET: ORAL | 7 days supply | Qty: 42 | Fill #1

## 2022-06-11 MED FILL — CETIRIZINE 10 MG TABLET: ORAL | 90 days supply | Qty: 90 | Fill #1

## 2022-06-17 MED FILL — ON CALL EXPRESS TEST STRIP: 100 days supply | Qty: 200 | Fill #1

## 2022-06-17 MED FILL — ALBUTEROL SULFATE HFA 90 MCG/ACTUATION AEROSOL INHALER: RESPIRATORY_TRACT | 17 days supply | Qty: 8.5 | Fill #3

## 2022-07-03 ENCOUNTER — Ambulatory Visit: Admit: 2022-07-03 | Discharge: 2022-07-03 | Payer: MEDICARE | Attending: Registered" | Primary: Registered"

## 2022-07-03 ENCOUNTER — Ambulatory Visit
Admit: 2022-07-03 | Discharge: 2022-07-03 | Payer: MEDICARE | Attending: Student in an Organized Health Care Education/Training Program | Primary: Student in an Organized Health Care Education/Training Program

## 2022-07-03 DIAGNOSIS — Z6838 Body mass index (BMI) 38.0-38.9, adult: Principal | ICD-10-CM

## 2022-07-03 DIAGNOSIS — B20 Human immunodeficiency virus [HIV] disease: Principal | ICD-10-CM

## 2022-07-03 MED ORDER — BIKTARVY 50 MG-200 MG-25 MG TABLET
ORAL_TABLET | Freq: Every day | ORAL | 4 refills | 30 days | Status: CP
Start: 2022-07-03 — End: ?

## 2022-07-04 DIAGNOSIS — K219 Gastro-esophageal reflux disease without esophagitis: Principal | ICD-10-CM

## 2022-07-04 MED ORDER — LANSOPRAZOLE 30 MG CAPSULE,DELAYED RELEASE
ORAL_CAPSULE | 3 refills | 0 days | Status: CP
Start: 2022-07-04 — End: ?

## 2022-07-10 ENCOUNTER — Ambulatory Visit: Admit: 2022-07-10 | Discharge: 2022-07-11 | Payer: MEDICARE

## 2022-07-10 DIAGNOSIS — Z6837 Body mass index (BMI) 37.0-37.9, adult: Principal | ICD-10-CM

## 2022-07-10 DIAGNOSIS — E0590 Thyrotoxicosis, unspecified without thyrotoxic crisis or storm: Principal | ICD-10-CM

## 2022-07-10 DIAGNOSIS — G8929 Other chronic pain: Principal | ICD-10-CM

## 2022-07-10 DIAGNOSIS — Z1211 Encounter for screening for malignant neoplasm of colon: Principal | ICD-10-CM

## 2022-07-10 DIAGNOSIS — E669 Obesity, unspecified: Principal | ICD-10-CM

## 2022-07-10 DIAGNOSIS — M25551 Pain in right hip: Principal | ICD-10-CM

## 2022-07-10 DIAGNOSIS — E1169 Type 2 diabetes mellitus with other specified complication: Principal | ICD-10-CM

## 2022-07-10 MED ORDER — OZEMPIC 1 MG/DOSE (4 MG/3 ML) SUBCUTANEOUS PEN INJECTOR
SUBCUTANEOUS | 3 refills | 28 days | Status: CP
Start: 2022-07-10 — End: ?
  Filled 2022-07-15: qty 3, 28d supply, fill #0

## 2022-07-13 DIAGNOSIS — G8929 Other chronic pain: Principal | ICD-10-CM

## 2022-07-15 MED FILL — FLUTICASONE PROPIONATE 50 MCG/ACTUATION NASAL SPRAY,SUSPENSION: NASAL | 90 days supply | Qty: 48 | Fill #7

## 2022-07-15 MED FILL — METFORMIN 500 MG TABLET: ORAL | 90 days supply | Qty: 180 | Fill #1

## 2022-07-15 MED FILL — LISINOPRIL 20 MG TABLET: ORAL | 90 days supply | Qty: 90 | Fill #1

## 2022-07-15 MED FILL — ALBUTEROL SULFATE HFA 90 MCG/ACTUATION AEROSOL INHALER: RESPIRATORY_TRACT | 17 days supply | Qty: 8.5 | Fill #4

## 2022-07-15 MED FILL — ATORVASTATIN 40 MG TABLET: ORAL | 90 days supply | Qty: 90 | Fill #1

## 2022-07-15 MED FILL — CLONIDINE HCL 0.1 MG TABLET: ORAL | 90 days supply | Qty: 90 | Fill #1

## 2022-07-16 MED ORDER — PEG 3350-ELECTROLYTES 236 GRAM-22.74 GRAM-6.74 GRAM-5.86 GRAM SOLUTION
0 refills | 0 days | Status: CP
Start: 2022-07-16 — End: ?

## 2022-07-31 MED FILL — OZEMPIC 0.25 MG OR 0.5 MG (2 MG/3 ML) SUBCUTANEOUS PEN INJECTOR: SUBCUTANEOUS | 28 days supply | Qty: 3 | Fill #1

## 2022-08-02 MED ORDER — NOVOFINE PLUS 32 GAUGE X 1/6" NEEDLE (4 MM)
SUBCUTANEOUS | 1 refills | 0 days | Status: CP
Start: 2022-08-02 — End: 2022-08-02

## 2022-08-27 ENCOUNTER — Ambulatory Visit: Admit: 2022-08-27 | Payer: MEDICARE | Attending: Emergency Medicine | Primary: Emergency Medicine

## 2022-08-28 MED FILL — TRAMADOL 50 MG TABLET: ORAL | 15 days supply | Qty: 90 | Fill #0

## 2022-09-05 DIAGNOSIS — H90A22 Sensorineural hearing loss, unilateral, left ear, with restricted hearing on the contralateral side: Principal | ICD-10-CM

## 2022-09-09 ENCOUNTER — Ambulatory Visit: Admit: 2022-09-09 | Discharge: 2022-09-09 | Payer: MEDICARE | Attending: Otolaryngology | Primary: Otolaryngology

## 2022-09-09 ENCOUNTER — Ambulatory Visit: Admit: 2022-09-09 | Discharge: 2022-09-09 | Payer: MEDICARE

## 2022-09-09 ENCOUNTER — Institutional Professional Consult (permissible substitution): Admit: 2022-09-09 | Discharge: 2022-09-09 | Payer: MEDICARE | Attending: Audiologist | Primary: Audiologist

## 2022-09-09 DIAGNOSIS — H9190 Unspecified hearing loss, unspecified ear: Principal | ICD-10-CM

## 2022-10-02 ENCOUNTER — Ambulatory Visit: Admit: 2022-10-02 | Discharge: 2022-10-03 | Payer: MEDICARE

## 2022-10-02 MED ORDER — CYCLOBENZAPRINE 10 MG TABLET
ORAL_TABLET | Freq: Three times a day (TID) | ORAL | 2 refills | 20 days | Status: CP | PRN
Start: 2022-10-02 — End: ?

## 2022-10-02 MED ORDER — LIDOCAINE 5 % TOPICAL PATCH
MEDICATED_PATCH | TRANSDERMAL | 2 refills | 30 days | Status: CP
Start: 2022-10-02 — End: ?
  Filled 2022-10-24: qty 30, 30d supply, fill #0

## 2022-10-02 MED ORDER — ALBUTEROL SULFATE HFA 90 MCG/ACTUATION AEROSOL INHALER
RESPIRATORY_TRACT | 2 refills | 34 days | Status: CP | PRN
Start: 2022-10-02 — End: ?
  Filled 2022-11-22: qty 17, 34d supply, fill #0

## 2022-10-02 MED ORDER — CLONIDINE HCL 0.1 MG TABLET
ORAL_TABLET | Freq: Every evening | ORAL | 1 refills | 90 days | Status: CP
Start: 2022-10-02 — End: ?
  Filled 2022-10-24: qty 90, 90d supply, fill #0

## 2022-10-02 MED ORDER — LANSOPRAZOLE 30 MG CAPSULE,DELAYED RELEASE
ORAL_CAPSULE | Freq: Every day | ORAL | 3 refills | 90 days | Status: CP
Start: 2022-10-02 — End: ?

## 2022-10-02 MED ORDER — ATORVASTATIN 40 MG TABLET
ORAL_TABLET | Freq: Every day | ORAL | 3 refills | 90 days | Status: CP
Start: 2022-10-02 — End: ?
  Filled 2022-10-24: qty 90, 90d supply, fill #0

## 2022-10-02 MED ORDER — OZEMPIC 1 MG/DOSE (4 MG/3 ML) SUBCUTANEOUS PEN INJECTOR
SUBCUTANEOUS | 3 refills | 28 days | Status: CP
Start: 2022-10-02 — End: ?
  Filled 2022-10-24: qty 3, 28d supply, fill #0

## 2022-10-02 MED ORDER — SERTRALINE 100 MG TABLET
ORAL_TABLET | Freq: Every day | ORAL | 3 refills | 90 days | Status: CP
Start: 2022-10-02 — End: ?

## 2022-10-02 MED ORDER — METFORMIN 500 MG TABLET
ORAL_TABLET | Freq: Two times a day (BID) | ORAL | 3 refills | 90 days | Status: CP
Start: 2022-10-02 — End: ?
  Filled 2022-10-24: qty 180, 90d supply, fill #0

## 2022-10-02 MED ORDER — LISINOPRIL 20 MG TABLET
ORAL_TABLET | Freq: Every day | ORAL | 3 refills | 90 days | Status: CP
Start: 2022-10-02 — End: ?
  Filled 2022-10-24: qty 90, 90d supply, fill #0

## 2022-10-15 MED ORDER — BIKTARVY 50 MG-200 MG-25 MG TABLET
ORAL_TABLET | Freq: Every day | ORAL | 1 refills | 30 days | Status: CP
Start: 2022-10-15 — End: ?

## 2022-10-24 MED FILL — CETIRIZINE 10 MG TABLET: ORAL | 90 days supply | Qty: 90 | Fill #2

## 2022-11-13 ENCOUNTER — Ambulatory Visit: Admit: 2022-11-13 | Discharge: 2022-11-14 | Payer: MEDICARE

## 2022-11-13 DIAGNOSIS — G8929 Other chronic pain: Principal | ICD-10-CM

## 2022-11-13 DIAGNOSIS — E669 Obesity, unspecified: Principal | ICD-10-CM

## 2022-11-13 DIAGNOSIS — E1169 Type 2 diabetes mellitus with other specified complication: Principal | ICD-10-CM

## 2022-11-13 MED ORDER — CYCLOBENZAPRINE 10 MG TABLET
ORAL_TABLET | Freq: Three times a day (TID) | ORAL | 2 refills | 0.00000 days | Status: CP | PRN
Start: 2022-11-13 — End: ?

## 2022-11-13 MED ORDER — SEMAGLUTIDE 2 MG/DOSE (8 MG/3 ML) SUBCUTANEOUS PEN INJECTOR
SUBCUTANEOUS | 3 refills | 84 days | Status: CP
Start: 2022-11-13 — End: ?
  Filled 2022-11-22: qty 3, 28d supply, fill #1

## 2022-11-14 ENCOUNTER — Ambulatory Visit: Admit: 2022-11-14 | Discharge: 2022-11-15 | Payer: MEDICARE

## 2022-11-20 DIAGNOSIS — J309 Allergic rhinitis, unspecified: Principal | ICD-10-CM

## 2022-11-20 MED ORDER — FLUTICASONE PROPIONATE 50 MCG/ACTUATION NASAL SPRAY,SUSPENSION
Freq: Every day | NASAL | 11 refills | 60 days | Status: CP
Start: 2022-11-20 — End: ?
  Filled 2022-11-22: qty 48, 90d supply, fill #0

## 2022-11-22 MED FILL — LIDOCAINE 5 % TOPICAL PATCH: TRANSDERMAL | 30 days supply | Qty: 30 | Fill #1

## 2022-11-22 NOTE — Unmapped (Signed)
[facility] Specialty Medication Onboarding    Specialty Medication: OZEMPIC 1 mg/dose (4 mg/3 mL) Pnij injection (semaglutide)  Prior Authorization: Not Required   Financial Assistance: No - copay  <$25  Final Copay/Day Supply: $0 / 28    Insurance Restrictions: None     Notes to Pharmacist: refill  Credit Card on File: yes    The triage team has completed the benefits investigation and has determined that the patient is able to fill this medication at [facility]. Please contact the patient to complete the onboarding or follow up with the prescribing physician as needed.

## 2022-11-28 NOTE — Unmapped (Signed)
[facility] has reached out to this patient via MyChart to onboard them to our Specialty Lite services for their Ozempic. Their medication was last delivered on 11/22/2022.They will now receive proactive outreach from the pharmacy team for refills.    Racheal Patches Pharmacy Candidate  [facility]

## 2022-12-09 NOTE — Unmapped (Signed)
[facility] Refill Coordination Note    Specialty Lite Medication(s) to be Shipped:   Ozempic RFTS until 8/9    Other medication(s) to be shipped: No additional medications requested for fill at this time     DORRIEN GRUNDER, DOB: 1960-08-09  Phone: [phone] (home)       All above HIPAA information was verified with patient.     Was a Nurse, learning disability used for this call? No    Changes to medications: Zayvian reports no changes at this time.  Changes to insurance: No      REFERRAL TO PHARMACIST     Referral to the pharmacist: Not needed      [facility]     Shipping address confirmed in Epic.     Delivery Scheduled: Yes, Expected medication delivery date: 8/09.     Medication will be delivered via  Same Day Courier to the  address in Epic WAM.    Alwyn Pea   [facility] Specialty

## 2022-12-11 DIAGNOSIS — G8929 Other chronic pain: Principal | ICD-10-CM

## 2022-12-11 DIAGNOSIS — M25551 Pain in right hip: Principal | ICD-10-CM

## 2022-12-11 MED ORDER — TRAMADOL 50 MG TABLET
ORAL_TABLET | Freq: Three times a day (TID) | ORAL | 0 refills | 15 days | Status: CP | PRN
Start: 2022-12-11 — End: 2023-01-09

## 2022-12-11 NOTE — Unmapped (Signed)
Addended by: [name] on: 12/11/2022 04:19 PM     Modules accepted: Orders

## 2022-12-11 NOTE — Unmapped (Signed)
Addended byBritta Mccreedy, [place] on: 12/11/2022 03:40 PM     Modules accepted: Orders

## 2022-12-11 NOTE — Unmapped (Signed)
Patient is requesting the following medication(s) for refill: Tramadol, an  Pharmacy name and address has been verified.

## 2022-12-11 NOTE — Unmapped (Signed)
Per benefits team inbasket message:   When they completed HMAP renewal this patient reported that they are going out of the country soon and may stay permanently. They stated, they're going to visit first then decide if they will stay. Their son wants them to stay permanently but patient is unsure. Patient was advised to contact ID if/when they decide to move.     Patient cancelled appt with Dr. Juel Burrow on 12/04/22. Pt reported he has already requested a 90 day fill.     Plan: Sent inbasket message to [facility] ID scheduling to contact patient to arrange return appointment with Dr. Juel Burrow prior to leaving the country.    Andres Ege, MSN, RN, FNP-BC  [facility]  [address]  [place], [place] [address]    Phone: [phone]  Fax: [phone]

## 2022-12-11 NOTE — Unmapped (Signed)
Patients medication was sent to wrong pharmacy, needs to be sent to [facility] Shared pharmacy

## 2022-12-11 NOTE — Unmapped (Signed)
Patient is requesting the following refill  Requested Prescriptions     Pending Prescriptions Disp Refills    traMADol (ULTRAM) 50 mg tablet 90 tablet 0     Sig: Take 1 to 2 tablets (50 to 100 mg total) by mouth every eight (8) hours as needed for pain.       Recent Visits  Date Type Provider Dept   11/13/22 Office Visit [name], MD [facility] [address] [facility]   10/02/22 Office Visit [name], MD [facility] [address] [facility]   07/10/22 Office Visit [name], MD [facility] [address]   05/09/22 Office Visit [name], MD [facility] [address]   03/21/22 Office Visit Tester, [place], [place] [facility] [address] [place]   12/18/21 Office Visit Tester, [place], [place] [facility] [address] [place]   Showing recent visits within past 365 days and meeting all other requirements  Future Appointments  Date Type Provider Dept   03/18/23 Appointment [name], MD [facility] [address] [place]   Showing future appointments within next 365 days and meeting all other requirements

## 2022-12-11 NOTE — Unmapped (Signed)
Called and notified pt that new rx sent. Pt gave verbal understanding.

## 2022-12-12 MED ORDER — TRAMADOL 50 MG TABLET
ORAL_TABLET | Freq: Two times a day (BID) | ORAL | 0 refills | 15 days | Status: CP | PRN
Start: 2022-12-12 — End: 2023-01-10
  Filled 2022-12-24: qty 60, 15d supply, fill #0

## 2022-12-12 NOTE — Unmapped (Signed)
Addended byBritta Mccreedy, [place] on: 12/12/2022 07:29 AM     Modules accepted: Orders

## 2022-12-12 NOTE — Unmapped (Signed)
Pt notified of pharmacy change. Pt gave verbal understanding.

## 2022-12-12 NOTE — Unmapped (Signed)
Addended by: [name] on: 12/12/2022 08:25 AM     Modules accepted: Orders

## 2022-12-13 MED FILL — OZEMPIC 1 MG/DOSE (4 MG/3 ML) SUBCUTANEOUS PEN INJECTOR: SUBCUTANEOUS | 56 days supply | Qty: 6 | Fill #2

## 2023-01-10 DIAGNOSIS — B20 Human immunodeficiency virus [HIV] disease: Principal | ICD-10-CM

## 2023-01-10 MED ORDER — BIKTARVY 50 MG-200 MG-25 MG TABLET
ORAL_TABLET | Freq: Every day | ORAL | 1 refills | 0 days | Status: CP
Start: 2023-01-10 — End: ?
  Filled 2023-03-24: qty 30, 30d supply, fill #0

## 2023-01-10 NOTE — Unmapped (Signed)
Medication Requested: Nathaniel Gibson       Future Appointments   Date Time Provider Department Center   03/18/2023  1:20 PM [name], MD [id number]     Per Provider Note:Overall doing well. He never misses doses of Biktarvy (BIC/FTC/TAF). Pill box working well. Fills it weekly      Standing order protocol requirements met?: Yes    Sent to: Pharmacy per protocol    Days Supply Given: 30 days  Number of Refills: 1

## 2023-01-22 NOTE — Unmapped (Signed)
Reaching out to schedule appointment for follow up and to check to see if the patient still wishes to receive care at [facility]. Admin has reached out to patient also.    Attempted to call patient for patient's re-engagement in care. Could not leave voicemail due to voicemail box not being set up. (Mobile #1)    Attempted to call patient for patient's re-engagement in care. The phone number listed is not in service, therefore contact could not be made. (Home)    Attempted to call patient's niece for patient's re-engagement in care. Relayed discreet message to inform the patient to schedule an appointment for re-engagement.     Sent MyChart message to patient to re-engage in care.     Informed care team of contact attempt.     [facility] Care Plan: Out of Care Patient (>6 months)    Patient's last ID clinic appt: 07/03/2022    Goal: Patient will have an office visit within 6 months of the last office visit unless stated otherwise by their provider.     Contact Interventions may include:  Placing phone calls to the patient and/or listed contacts.  Sending the patient an Out of Care text message through the [employer] App or [facility] cell phone (if texting agreement is signed).  Sending the patient an Out of Care MyChart/letter.  Contacting the patient's most recent pharmacies as needed for additional information such as contact information or refill history.  Contacting the patient's case manager/caregiver as needed for additional information, if assigned.  Researching other online resources as needed.     If the patient is reached, the [employer] will attempt to:  Update all contact information including primary and secondary phone numbers and emergency contact details and address. Confirm access to MyChart.  Complete the Out of Care Intervention questionnaire with the patient.  Explore and discuss any barriers to visit attendance the patient may be experiencing that result in missed appointments, no-shows, cancellations, or lack of a scheduled follow-up appointment. These may include communication barriers (language, phone access, texting, MyChart), transportation, financial concerns, time off work, mental health/substance use, and/or multiple medical appointments.  Make appropriate referrals and link the patient to clinic nurses, social workers, benefits counselors, or other support services in the [facility] ID clinic to address concerns or barriers to care.  Schedule a follow-up appointment at an appropriate interval based on a completed assessment.  Confirm the patient has access to ART medications until that appointment. If the patient is not currently taking ART, needs refills, or is having difficulty taking the medication, refer to the clinical staff for follow up via [employer].    Monitor attendance of the patient's scheduled appointment and provide reminder calls/texts/messages consistent with patient preference, if needed.  Continue follow-up if indicated.    Expected Outcome:  Patient will have an office visit within 6 months of the last office visit unless stated otherwise by their provider.    If the patient has an urgent medical problem send a chat message to the clinic nursing staff for immediate follow-up     **If the patient is not able to be located or retained in HIV care, a referral will be placed to the [facility] HIV [facility] Counselor for further follow-up.    Duration of Service: 15 minutes    Drae Mitzel  Linkage and Firefighter [facility]

## 2023-01-23 NOTE — Unmapped (Signed)
FYI in case the patient calling in. It would be great if we could confirm if he still wants care here.     [name]

## 2023-02-14 NOTE — Unmapped (Signed)
Reaching out to schedule appointment for follow up and to check to see if the patient still wishes to receive care at [facility].    Sent text message to patient to re-engage in care via [employer] texting app.     [facility] Care Plan: Out of Care Patient (>6 months)    Patient's last ID clinic appt: 07/03/2022    Goal: Patient will have an office visit within 6 months of the last office visit unless stated otherwise by their provider.     Contact Interventions may include:  Placing phone calls to the patient and/or listed contacts.  Sending the patient an Out of Care text message through the [employer] App or [facility] cell phone (if texting agreement is signed).  Sending the patient an Out of Care MyChart/letter.  Contacting the patient's most recent pharmacies as needed for additional information such as contact information or refill history.  Contacting the patient's case manager/caregiver as needed for additional information, if assigned.  Researching other online resources as needed.     If the patient is reached, the [employer] will attempt to:  Update all contact information including primary and secondary phone numbers and emergency contact details and address. Confirm access to MyChart.  Complete the Out of Care Intervention questionnaire with the patient.  Explore and discuss any barriers to visit attendance the patient may be experiencing that result in missed appointments, no-shows, cancellations, or lack of a scheduled follow-up appointment. These may include communication barriers (language, phone access, texting, MyChart), transportation, financial concerns, time off work, mental health/substance use, and/or multiple medical appointments.  Make appropriate referrals and link the patient to clinic nurses, social workers, benefits counselors, or other support services in the [facility] ID clinic to address concerns or barriers to care.  Schedule a follow-up appointment at an appropriate interval based on a completed assessment.  Confirm the patient has access to ART medications until that appointment. If the patient is not currently taking ART, needs refills, or is having difficulty taking the medication, refer to the clinical staff for follow up via [employer].    Monitor attendance of the patient's scheduled appointment and provide reminder calls/texts/messages consistent with patient preference, if needed.  Continue follow-up if indicated.    Expected Outcome:  Patient will have an office visit within 6 months of the last office visit unless stated otherwise by their provider.    If the patient has an urgent medical problem send a chat message to the clinic nursing staff for immediate follow-up     **If the patient is not able to be located or retained in HIV care, a referral will be placed to the [facility] HIV [facility] Counselor for further follow-up.    Duration of Service: 7 minutes    Shanai Lartigue  Linkage and Firefighter [facility]

## 2023-02-24 NOTE — Unmapped (Signed)
Attempted to call patient for patient's re-engagement in care. The phone number listed is not in service, therefore contact could not be made.     Drafted and sent an out of care letter in efforts to get the patient back into care.      Sent MyChart message to patient to re-engage in care.     Sent text message to patient to re-engage in care via [employer] texting app.     [facility] Care Plan: Out of Care Patient (>6 months)    Patient's last ID clinic appt: 07/03/2022    Goal: Patient will have an office visit within 6 months of the last office visit unless stated otherwise by their provider.     Contact Interventions may include:  Placing phone calls to the patient and/or listed contacts.  Sending the patient an Out of Care text message through the [employer] App or [facility] cell phone (if texting agreement is signed).  Sending the patient an Out of Care MyChart/letter.  Contacting the patient's most recent pharmacies as needed for additional information such as contact information or refill history.  Contacting the patient's case manager/caregiver as needed for additional information, if assigned.  Researching other online resources as needed.     If the patient is reached, the [employer] will attempt to:  Update all contact information including primary and secondary phone numbers and emergency contact details and address. Confirm access to MyChart.  Complete the Out of Care Intervention questionnaire with the patient.  Explore and discuss any barriers to visit attendance the patient may be experiencing that result in missed appointments, no-shows, cancellations, or lack of a scheduled follow-up appointment. These may include communication barriers (language, phone access, texting, MyChart), transportation, financial concerns, time off work, mental health/substance use, and/or multiple medical appointments.  Make appropriate referrals and link the patient to clinic nurses, social workers, benefits counselors, or other support services in the [facility] ID clinic to address concerns or barriers to care.  Schedule a follow-up appointment at an appropriate interval based on a completed assessment.  Confirm the patient has access to ART medications until that appointment. If the patient is not currently taking ART, needs refills, or is having difficulty taking the medication, refer to the clinical staff for follow up via [employer].    Monitor attendance of the patient's scheduled appointment and provide reminder calls/texts/messages consistent with patient preference, if needed.  Continue follow-up if indicated.    Expected Outcome:  Patient will have an office visit within 6 months of the last office visit unless stated otherwise by their provider.    If the patient has an urgent medical problem send a chat message to the clinic nursing staff for immediate follow-up     **If the patient is not able to be located or retained in HIV care, a referral will be placed to the [facility] HIV [facility] Counselor for further follow-up.    Duration of Service: 10 minutes    Carissa Crews  Linkage and Insurance claims handler [facility]

## 2023-03-11 DIAGNOSIS — G8929 Other chronic pain: Principal | ICD-10-CM

## 2023-03-11 MED ORDER — LIDOCAINE 5 % TOPICAL PATCH
MEDICATED_PATCH | TRANSDERMAL | 2 refills | 30 days
Start: 2023-03-11 — End: ?

## 2023-03-11 NOTE — Unmapped (Signed)
Patient called requesting a refill on lidocaine patches. Pharmacy verified.

## 2023-03-11 NOTE — Unmapped (Signed)
Patient is requesting the following refill  Requested Prescriptions     Pending Prescriptions Disp Refills    lidocaine (LIDODERM) 5 % patch 30 patch 2     Sig: Place 1 patch on the affected area for 12 hours only each day (then remove patch) as directed       Recent Visits  Date Type Provider Dept   11/13/22 Office Visit [name], MD [facility] [address] [facility]   10/02/22 Office Visit [name], MD [place] [address] [facility]   07/10/22 Office Visit [name], MD [facility] [address] [place]   05/09/22 Office Visit [name], MD [facility] [address]   03/21/22 Office Visit Tester, [place], [place] [facility] [address] [employer] [employer] [place]   Showing recent visits within past 365 days and meeting all other requirements  Future Appointments  Date Type Provider Dept   03/13/23 Appointment [name], MD [facility] [address] [place]   Showing future appointments within next 365 days and meeting all other requirements

## 2023-03-11 NOTE — Unmapped (Signed)
Addended byBritta Mccreedy, [place] on: 03/11/2023 09:38 AM     Modules accepted: Orders

## 2023-03-12 NOTE — Unmapped (Signed)
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  -     Discontinue: metFORMIN; Take 1 tablet (500 mg total) by mouth in the morning and 1 tablet (500 mg total) in the evening with meals.  -     metFORMIN; Take 1 tablet (500 mg total) by mouth in the morning and 1 tablet (500 mg total) in the evening with meals.    Other chronic pain  -     lidocaine; Place 1 patch on the affected area for 12 hours only each day (then remove patch) as directed    Essential hypertension  -     Discontinue: lisinopriL; Take 1 tablet (20 mg total) by mouth daily.  -     cloNIDine HCL; Take 1 tablet (0.1 mg total) by mouth nightly.  -     lisinopriL; Take 1 tablet (20 mg total) by mouth daily.    Mixed hyperlipidemia  -     Discontinue: atorvastatin; Take 1 tablet (40 mg total) by mouth daily.  -     atorvastatin; Take 1 tablet (40 mg total) by mouth daily.    Screening for lung cancer  -     CT Lung Cancer Screening Baseline or Annual Low Dose; Future    Personal history of nicotine dependence  -     CT Lung Cancer Screening Baseline or Annual Low Dose; Future    Acute malignant otitis externa of right ear    Other orders  -     ciprofloxacin-dexamethasone; Administer 4 drops to the right ear two (2) times a day for 7 days.      Multiple diagnoses above reviewed with patient  Today's A1c is 6.1; at goal less than 7.   Congratulated patient on downtrending weight. Continue this.   Blood pressure borderline at goal at triage today. Will continue to monitor.   Continue current medication management. Refills as above.  Rx as per orders; reviewed risks/benefits, possible side effects and patient verbalizes understanding  Referrals/labs as per orders above  Checking A1c today.   Ordering repeat CT Lung.  Recommend supportive treatment with lifestyle modifications with diet/exercise  Follow up in 2 months or sooner prn      Barriers to recommended plan: None identified      I personally spent 30 minutes face-to-face and non-face-to-face in the care of this patient, which includes all pre, intra, and post visit time (reviewing, evaluating and discussing pertinent records for patient's care) on the date of service.     Return in about 2 months (around 05/13/2023) for 40 minute follow up CDM.    Subjective:     HPI: Nathaniel Gibson is a 62 y.o. male here for CDM. Last seen by me 11/13/22 for CDM. Patient with h/o DM2, COPD, HTN, HLD, MDD, HIV infection, chronic pain, GERD, obesity, osteoarthritis. Doing well overall today. He is back from visiting his godson in [place] for the last 3 months. In January he is planning on travelling to [place] to visit his other godson for 2 months.    DM2  Managing with Ozempic 2 mg weekly (increased at last OV) and metformin 500 mg twice daily. Patient reports he ran out of Ozempic ~3 weeks ago. He has been working on weight loss and is down 16 lbs.  Wt Readings from Last 6 Encounters:   03/13/23 (!) 128.9 kg (284 lb 3.2 oz)   11/13/22 (!) 136.3 kg (300 lb 6.4 oz)   10/02/22 (!) 133.5 kg (294 lb 4 oz)   09/09/22 (!) 132 kg (291 lb)   07/10/22 (!) 135.2 kg (298 lb)   07/03/22 (!) 136.3 kg (300 lb 6.4 oz)     Hypertension  Patient with history of hypertension manages with lisinopril 20 mg daily. Blood pressure at triage today 144/85. Denies CP, SOB, L/D or peripheral edema.    BP Readings from Last 3 Encounters:   03/13/23 144/85   11/13/22 135/87   10/02/22 120/75     Chronic pain  Followed by ortho (Dr. Kristine Royal). Patient manages with Flexeril 10 mg TID prn, tramadol 50-100  mg q8h prn, and lidocaine 5% patch. Requesting a refill of lidocaine patch today. Reports his chronic pain limits his mobility.    Tobacco use  Patient is currently smoking cigarettes and marijuana, started smoking when he was 14. States he is due for lung cancer screening. He is using nicotine lozenges. Hyperlipidemia  On atorvastatin 40 mg daily and requesting a refill. Symptoms stable.      HPI       ROS:   Review of Systems     Review of systems negative unless otherwise noted as per HPI.      The following portions of the patient's history were reviewed and updated as appropriate: allergies, current medications, past family history, past medical history, past social history, past surgical history and problem list.     Objective:     Vitals:    03/13/23 1048   BP: 144/85   Pulse: 74   Temp: 36.7 ??C (98 ??F)     Body mass index is 36 kg/m??.    Physical Exam  Vitals and nursing note reviewed.   Constitutional:       General: He is not in acute distress.     Appearance: Normal appearance. He is not ill-appearing, toxic-appearing or diaphoretic.   HENT:      Right Ear: External ear normal.      Left Ear: Tympanic membrane, ear canal and external ear normal.      Ears:      Comments: R ear canal: mild edema, erythema, and purulent drainage  Cardiovascular:      Rate and Rhythm: Normal rate and regular rhythm.      Heart sounds: Normal heart sounds.   Pulmonary:      Effort: Pulmonary effort is normal. No respiratory distress.      Breath sounds: Normal breath sounds.   Musculoskeletal:      Cervical back: Neck supple.   Neurological:      Mental Status: He is alert.          Allergies:     Naprosyn [naproxen]    [facility]:     Medication adherence and barriers to the treatment plan have been addressed. Opportunities to optimize healthy behaviors have been discussed. Patient / caregiver voiced understanding.      I attest that I, Zuling F Quade, personally documented this note while acting as scribe for [name], MD.      [name], Scribe.  03/13/2023     The documentation recorded by the scribe accurately reflects the service I personally performed and the decisions made by me.    [name], MD

## 2023-03-13 ENCOUNTER — Ambulatory Visit: Admit: 2023-03-13 | Discharge: 2023-03-14 | Payer: MEDICARE

## 2023-03-13 DIAGNOSIS — H6021 Malignant otitis externa, right ear: Principal | ICD-10-CM

## 2023-03-13 DIAGNOSIS — G8929 Other chronic pain: Principal | ICD-10-CM

## 2023-03-13 DIAGNOSIS — E669 Obesity, unspecified: Principal | ICD-10-CM

## 2023-03-13 DIAGNOSIS — E1169 Type 2 diabetes mellitus with other specified complication: Principal | ICD-10-CM

## 2023-03-13 DIAGNOSIS — I10 Essential (primary) hypertension: Principal | ICD-10-CM

## 2023-03-13 DIAGNOSIS — Z122 Encounter for screening for malignant neoplasm of respiratory organs: Principal | ICD-10-CM

## 2023-03-13 DIAGNOSIS — E782 Mixed hyperlipidemia: Principal | ICD-10-CM

## 2023-03-13 DIAGNOSIS — Z87891 Personal history of nicotine dependence: Principal | ICD-10-CM

## 2023-03-13 MED ORDER — LIDOCAINE 5 % TOPICAL PATCH
MEDICATED_PATCH | TRANSDERMAL | 2 refills | 30 days | Status: CP
Start: 2023-03-13 — End: ?
  Filled 2023-04-29: qty 30, 30d supply, fill #0

## 2023-03-13 MED ORDER — CLONIDINE HCL 0.1 MG TABLET
ORAL_TABLET | Freq: Every evening | ORAL | 3 refills | 90 days | Status: CP
Start: 2023-03-13 — End: ?
  Filled 2023-03-13: qty 90, 90d supply, fill #0

## 2023-03-13 MED ORDER — METFORMIN 500 MG TABLET
ORAL_TABLET | Freq: Two times a day (BID) | ORAL | 3 refills | 0.00000 days | Status: CP
Start: 2023-03-13 — End: 2023-03-13
  Filled 2023-03-24: qty 180, 90d supply, fill #0

## 2023-03-13 MED ORDER — SEMAGLUTIDE 2 MG/DOSE (8 MG/3 ML) SUBCUTANEOUS PEN INJECTOR
SUBCUTANEOUS | 3 refills | 84 days | Status: CP
Start: 2023-03-13 — End: ?
  Filled 2023-03-24: qty 9, 84d supply, fill #0

## 2023-03-13 MED ORDER — ATORVASTATIN 40 MG TABLET
ORAL_TABLET | Freq: Every day | ORAL | 3 refills | 90.00000 days | Status: CP
Start: 2023-03-13 — End: 2023-03-13
  Filled 2023-03-24: qty 90, 90d supply, fill #0

## 2023-03-13 MED ORDER — OZEMPIC 2 MG/DOSE (8 MG/3 ML) SUBCUTANEOUS PEN INJECTOR
SUBCUTANEOUS | 3 refills | 0 days
Start: 2023-03-13 — End: ?

## 2023-03-13 MED ORDER — LISINOPRIL 20 MG TABLET
ORAL_TABLET | Freq: Every day | ORAL | 3 refills | 90.00000 days | Status: CP
Start: 2023-03-13 — End: 2023-03-13
  Filled 2023-03-24: qty 90, 90d supply, fill #0

## 2023-03-13 MED ORDER — CIPROFLOXACIN 0.3 %-DEXAMETHASONE 0.1 % EAR DROPS,SUSPENSION
Freq: Two times a day (BID) | OTIC | 0 refills | 19 days | Status: CP
Start: 2023-03-13 — End: 2023-03-20
  Filled 2023-03-21: qty 7.5, 19d supply, fill #0

## 2023-03-13 MED FILL — FLUTICASONE PROPIONATE 50 MCG/ACTUATION NASAL SPRAY,SUSPENSION: NASAL | 90 days supply | Qty: 48 | Fill #1

## 2023-03-13 MED FILL — ALBUTEROL SULFATE HFA 90 MCG/ACTUATION AEROSOL INHALER: RESPIRATORY_TRACT | 34 days supply | Qty: 17 | Fill #1

## 2023-03-21 DIAGNOSIS — M25551 Pain in right hip: Principal | ICD-10-CM

## 2023-03-21 DIAGNOSIS — G8929 Other chronic pain: Principal | ICD-10-CM

## 2023-03-21 MED ORDER — NICOTINE (POLACRILEX) 4 MG BUCCAL LOZENGE
BUCCAL | 2 refills | 12 days | Status: CP | PRN
Start: 2023-03-21 — End: ?
  Filled 2023-03-24: qty 72, 12d supply, fill #0

## 2023-03-21 MED ORDER — SERTRALINE 100 MG TABLET
ORAL_TABLET | 0 refills | 0 days
Start: 2023-03-21 — End: ?

## 2023-03-21 MED ORDER — TRAMADOL 50 MG TABLET
ORAL_TABLET | Freq: Two times a day (BID) | ORAL | 0 refills | 15 days | Status: CP | PRN
Start: 2023-03-21 — End: 2023-04-19
  Filled 2023-03-24: qty 60, 15d supply, fill #0

## 2023-03-21 MED ORDER — LIDOCAINE 5 % TOPICAL PATCH
MEDICATED_PATCH | TRANSDERMAL | 2 refills | 0 days
Start: 2023-03-21 — End: ?

## 2023-03-21 NOTE — Unmapped (Signed)
[facility]  Specialty Lite Counseling    Mr.Kunst is a 62 y.o. male with Diabetes type 2 who I am counseling today on continuation of therapy.  I am speaking to the patient.    Was a Nurse, learning disability used for this call? No    Verified patient's date of birth / HIPAA.    Specialty Lite medication(s) to be sent: Ozempic      Non-specialty medications/supplies to be sent:   Atorvastatin40mg   Cyclobenzaprine 10mg   Lisinopril 20mg   Metformin 500mg   Nicotine 4mg  lozenges  Tramadol 50mg     Medications not needed at this time: n/a         An offer to provide counseling to the patient regarding their medication was made. The patient declined counseling .      Current Medications (including OTC/herbals), Comorbidities and Allergies     Current Outpatient Medications   Medication Sig Dispense Refill    acetaminophen (TYLENOL 8 HOUR) 650 MG CR tablet Take 1 tablet (650 mg total) by mouth every eight (8) hours as needed for pain.      albuterol HFA 90 mcg/actuation inhaler Inhale 2 puffs by mouth every four (4) hours as needed for wheezing. 17 g 2    atorvastatin (LIPITOR) 40 MG tablet Take 1 tablet (40 mg total) by mouth daily. 90 tablet 3    atorvastatin (LIPITOR) 40 MG tablet Take 1 tablet (40 mg) by mouth daily. 90 tablet 4    benzonatate (TESSALON PERLES) 100 MG capsule Take 1 capsule (100 mg total) by mouth Three (3) times a day as needed for cough. 30 capsule 1    bictegrav-emtricit-tenofov ala (BIKTARVY) 50-200-25 mg tablet Take 1 tablet by mouth daily. 30 tablet 1    BIKTARVY 50-200-25 mg tablet TAKE 1 TABLET BY MOUTH DAILY 30 tablet 1    blood sugar diagnostic (ON CALL EXPRESS TEST STRIP) Strp Check blood sugar as directed once a day and for symptoms of high or low blood sugar. 200 each 4    blood-glucose meter (ON CALL EXPRESS METER) Misc USE AS DIRECTED TO TEST BLOOD SUGAR 1 each 0    blood-glucose meter kit Use as instructed 1 each 0    ciprofloxacin-dexAMETHasone (CIPRODEX) 0.3-0.1 % otic suspension Administer 4 drops to the right ear two (2) times a day for 7 days. 7.5 mL 0    cloNIDine HCL (CATAPRES) 0.1 MG tablet Take 1 tablet (0.1 mg total) by mouth nightly. 90 tablet 3    cyclobenzaprine (FLEXERIL) 10 MG tablet Take 1 tablet (10 mg total) by mouth Three (3) times a day as needed. 60 tablet 2    cyclobenzaprine (FLEXERIL) 10 MG tablet Take 1 tablet (10 mg) by mouth three times daily as needed. 60 tablet 2    fluticasone propionate (FLONASE) 50 mcg/actuation nasal spray Use 2 sprays in each nostril daily. 16 g 11    lancing device (ON CALL LANCING DEVICE) Misc USE AS DIRECTED TO CHECK YOUR BLOOD SUGAR 1 each 0    lansoprazole (PREVACID) 30 MG capsule Take 1 capsule (30 mg total) by mouth daily. 90 capsule 3    lidocaine (LIDODERM) 5 % patch Place 1 patch on the affected area for 12 hours only each day (then remove patch) as directed 30 patch 2    lidocaine (LIDODERM) 5 % patch Apply 1 patch externally to the affected area for 12 hours only each day, then remove patch as directed 30 patch 2    lisinopril (  PRINIVIL,ZESTRIL) 20 MG tablet Take 1 tablet (20 mg total) by mouth daily. 90 tablet 3    lisinopril (PRINIVIL,ZESTRIL) 20 MG tablet Take 1 tablet (20 mg) by mouth daily. 90 tablet 3    metFORMIN (GLUCOPHAGE) 500 MG tablet Take 1 tablet (500 mg total) by mouth in the morning and 1 tablet (500 mg total) in the evening with meals. 180 tablet 3    metFORMIN (GLUCOPHAGE) 500 MG tablet Take 1 tablet (500 mg) by mouth in the morning and in the evening with meals. 180 tablet 3    multivitamin (THERAGRAN) per tablet Take 1 tablet by mouth daily.      nicotine polacrilex (NICORETTE MINI) lozenge 4 MG Apply 1 lozenge (4 mg total) to cheek every four (4) hours as needed for smoking cessation. 72 each 2    polyethylene glycol (GOLYTELY) 236-22.74-6.74 gram solution Take by mouth as directed per [facility] GI prep instructions, for split bowel prep. 4000 mL 0    QUEtiapine (SEROQUEL) 25 MG tablet Take 1 tablet (25 mg total) by mouth nightly.      semaglutide (OZEMPIC) 1 mg/dose (4 mg/3 mL) PnIj injection Inject 1 mg under the skin every seven (7) days. 3 mL 3    semaglutide (OZEMPIC) 2 mg/dose (8 mg/3 mL) PnIj Inject 2 mg under the skin every seven (7) days. 9 mL 3    semaglutide (OZEMPIC) 2 mg/dose (8 mg/3 mL) PnIj Inject 2mg  under the skin one day a week. 9 mL 3    sertraline (ZOLOFT) 100 MG tablet Take 2 tablets (200 mg total) by mouth daily. 180 tablet 3    sertraline (ZOLOFT) 100 MG tablet Take 2 tablets (200 mg) by mouth daily 180 tablet 0    traMADol (ULTRAM) 50 mg tablet Take 1-2 tablets (50-100 mg total) by mouth two (2) times a day as needed. 60 tablet 0     Current Facility-Administered Medications   Medication Dose Route Frequency Provider Last Rate Last Admin    triamcinolone acetonide (KENALOG-40) injection 20 mg  20 mg Intra-articular Once Basilio Cairo, MD        triamcinolone acetonide (KENALOG-40) injection 20 mg  20 mg Intra-articular Once Basilio Cairo, MD           Allergies   Allergen Reactions    Naprosyn [Naproxen] Other (See Comments)     GI bleed       Patient Active Problem List   Diagnosis    Obesity    Primary localized osteoarthrosis, lower leg    Tobacco dependence    Right foot pain    Hyperthyroidism    Bone spur    Perirectal cyst    Pterygium eye    Diabetes mellitus type 2 in obese    Hypertension associated with diabetes (CMS-HCC)    Mixed hyperlipidemia    HIV (human immunodeficiency virus infection) (CMS-HCC)    MDD (major depressive disorder), recurrent episode, moderate (CMS-HCC)    Anal wart    Knee pain    Intestinal metaplasia of gastric mucosa    Allergic rhinitis    Right hip pain    Annual physical exam    Depression    Rectal polyp    Impacted cerumen    COPD (chronic obstructive pulmonary disease) (CMS-HCC)    Gastroesophageal reflux disease    Other chronic pain    Cervical radiculopathy    Difficulty demonstrating health literacy    Cholesteatoma of right ear Left upper arm pain  Health care maintenance    Decreased appetite    History of colonic polyps    History of hyperthyroidism    Personal history of nicotine dependence       Reviewed and up to date in Epic.    Appropriateness of Therapy     Prescription has been clinically reviewed: Yes    Financial Information     Medication Assistance provided: None Required    Anticipated copay of $0.00 reviewed with patient.     Patient Specific Needs     Does the patient have any physical, cognitive, or cultural barriers? No    Does the patient have adequate living arrangements? (i.e. the ability to store and take their medication appropriately) Yes    Did you identify any home environmental safety or security hazards? No    Patient prefers to have medications discussed with  Patient     Is the patient or caregiver able to read and understand education materials at a high school level or above? Yes    Patient's primary language is  English     Is the patient high risk? No    SOCIAL DETERMINANTS OF HEALTH     At the [facility], we have learned that life circumstances - like trouble affording food, housing, utilities, or transportation can affect the health of many of our patients.   That is why we wanted to ask: are you currently experiencing any life circumstances that are negatively impacting your health and/or quality of life? Patient declined to answer    Social Determinants of Health     Food Insecurity: No Food Insecurity (03/13/2023)    Hunger Vital Sign     Worried About Running Out of Food in the Last Year: Never true     Ran Out of Food in the Last Year: Never true   Internet Connectivity: Not on file   Housing/Utilities: Not on file   Tobacco Use: High Risk (03/13/2023)    Patient History     Smoking Tobacco Use: Every Day     Smokeless Tobacco Use: Never     Passive Exposure: Not on file   Transportation Needs: Unmet Transportation Needs (06/13/2017)    Received from [facility], [facility]    PRAPARE - Transportation     Lack of Transportation (Medical): Yes     Lack of Transportation (Non-Medical): Yes   Alcohol Use: Not on file   Interpersonal Safety: Not At Risk (03/13/2023)    Interpersonal Safety     Unsafe Where You Currently Live: No     Physically Hurt by Anyone: No     Abused by Anyone: No   Physical Activity: Inactive (06/13/2017)    Received from [facility], [facility]    Exercise Vital Sign     Days of Exercise per Week: 0 days     Minutes of Exercise per Session: 0 min   Intimate Partner Violence: Not At Risk (06/13/2017)    Received from [facility], [facility]    Humiliation, Afraid, Rape, and Kick questionnaire     Fear of Current or Ex-Partner: No     Emotionally Abused: No     Physically Abused: No     Sexually Abused: No   Stress: Stress Concern Present (06/13/2017)    Received from [facility]    [facility] - Occupational Stress Questionnaire     Feeling of Stress : Very much   Substance Use: Not  on file (03/21/2023)   Social Connections: Somewhat Isolated (06/13/2017)    Received from [facility], [facility]    Social Connection and Isolation Panel [NHANES]     Frequency of Communication with Friends and Family: More than three times a week     Frequency of Social Gatherings with Friends and Family: Once a week     Attends Religious Services: More than 4 times per year     Active Member of [employer] or Organizations: No     Attends Banker Meetings: Never     Marital Status: Never married   Physicist, medical Strain: Medium Risk (06/13/2017)    Received from [facility], [facility]    Overall Financial Resource Strain (CARDIA)     Difficulty of Paying Living Expenses: Somewhat hard   Depression: Not at risk (05/29/2021)    PHQ-2     PHQ-2 Score: 0   Health Literacy: Low Risk  (03/13/2023)    Health Literacy     : Never       Would you be willing to receive help with any of the needs that you have identified today? Not applicable    Delivery Information     Verified delivery address.    Scheduled delivery date: 03/24/23    Expected start date: continuation of current therapy    Medication will be delivered via Same Day Courier to the prescription address in [facility].  This shipment will require a signature.      Explained the services we provide at [facility] and that each month we will send text messages and/or mychart messages to set up refills. Informed patient that refills should be scheduled 7-10 days prior to when they will run out of medication. Informed patient that a welcome packet, containing information about our pharmacy and other support services, a Notice of Privacy Practices, and a drug information handout will be sent.      The patient or caregiver noted above participated in the development of this care plan and knows that they can request review of or adjustments to the care plan at any time.      Patient or caregiver verbalized understanding of the above information as well as how to contact the pharmacy at [phone] option 4 with any questions/concerns.  The pharmacy is open Monday through Friday 8:30am-4:30pm.  A pharmacist is available 24/7 via pager to answer any clinical questions they may have.      Roderic Palau, PharmD  [facility] Specialty Pharmacist

## 2023-03-21 NOTE — Unmapped (Addendum)
[facility]    Patient Onboarding/Medication Counseling    [name] is a 62 y.o. male with HIV  who I am counseling today on continuation of therapy.  I am speaking to the patient.    Was a Nurse, learning disability used for this call? No    Verified patient's date of birth / HIPAA.    Specialty medication(s) to be sent: Infectious Disease: Biktarvy and Specialty Lite: Ozempic      Non-specialty medications/supplies to be sent:   Atorvastatin40mg   Cyclobenzaprine 10mg   Lisinopril 20mg   Metformin 500mg   Nicotine 4mg  lozenges  Tramadol 50mg     Medications not needed at this time: n/a         Biktarvy (bictegravir, emtricitabine, and tenofovir alafenamide) 50-200-25mg     The patient declined counseling on medication administration, missed dose instructions, goals of therapy, side effects and monitoring parameters, warnings and precautions, drug/food interactions, and storage, handling precautions, and disposal because they have taken the medication previously. The information in the declined sections below are for informational purposes only and was not discussed with patient.       Medication & Administration     Dosage: Take 1 tablet by mouth daily    Administration: Take without regard to food    Adherence/Missed dose instructions: take missed dose as soon as you remember. If it is close to the time of your next dose, skip the dose and resume with your next scheduled dose.    Goals of Therapy     To suppress viral replication and keep patient's HIV undetectable by lab tests    Side Effects & Monitoring Parameters     Common Side Effects:  Diarrhea  Upset stomach  Headache  Changes in Weight  Changes in mood    The following side effects should be reported to the provider:     If patient experiences: signs of an allergic reaction (rash; hives; itching; red, swollen, blistered, or peeling skin with or without fever; wheezing; tightness in the chest or throat; trouble breathing, swallowing, or talking; unusual hoarseness; or swelling of the mouth, face, lips, tongue, or throat)  signs of kidney problems (unable to pass urine, change in how much urine is passed, blood in the urine, or a big weight gain)  signs of liver problems (dark urine, feeling tired, not hungry, upset stomach or stomach pain, light-colored stools, throwing up, or yellow skin or eyes)  signs of lactic acidosis (fast breathing, fast heartbeat, a heartbeat that does not feel normal, very bad upset stomach or throwing up, feeling very sleepy, shortness of breath, feeling very tired or weak, very bad dizziness, feeling cold, or muscle pain or cramps)  Weight gain: some patients have reported weight gain after starting this medication. The amount of weight can vary.    Monitoring Parameters:  CD4  Count  HIV RNA plasma levels,  Liver function  Total bilirubin  serum creatinine  urine glucose  urine protein (prior to or when initiating therapy and as clinically indicated during therapy);       Drug/Food Interactions     Medication list reviewed in Epic. The patient was instructed to inform the care team before taking any new medications or supplements.   Drug interactions:  Multivitamin with minerals: may lower levels of Biktarvy.  I told him to take Biktarvy 2 hours before or 6 hours after the mvi with min.  Alternatively, I told him he can take together with food.    Metformin: Biktarvy can  increase levels of metformin thus increasing the chance of adverse effects such as lactic acidosis.  He has been taking together for a long time with no problems yet.  Calcium Salts: May decrease the serum concentration of Biktarvy. If taken with food, Biktarvy can be administered with calcium salts.   Iron Preparations: May decrease the serum concentration of Biktarvy. If taken with food, Biktarvy can be administered with Ferrous sulfate. If taken on an empty stomach, Biktarvy must be taken 2 hours before ferrous sulfate. Avoid other iron salts.    Contraindications, Warnings, & Precautions     Black Box Warning: Severe acute exacertbations of HBV have been reported in patients coinfected with HIV-1 and HBV fllowing discontinuation of therapy  Coadministration with dofetilide, rifampin is contraindicated  Immune reconstitution syndrome: Patients may develop immune reconstitution syndrome, resulting in the occurrence of an inflammatory response to an indolent or residual opportunistic infection or activation of autoimmune disorders (eg, Graves disease, polymyositis, Guillain-Barr?? syndrome, autoimmune hepatitis)   Lactic acidosis/hepatomegaly  Renal toxicity: patients with preexisting renal impairment and those taking nephrotoxic agents (including NSAIDs) are at increased risk.     Storage, Handling Precautions, & Disposal     Store in the original container at room temperature.   Keep lid tightly closed.   Store in a dry place. Do not store in a bathroom.   Keep all drugs in a safe place. Keep all drugs out of the reach of children and pets.   Throw away unused or expired drugs. Do not flush down a toilet or pour down a drain unless you are told to do so. Check with your pharmacist if you have questions about the best way to throw out drugs. There may be drug take-back programs in your area.      Current Medications (including OTC/herbals), Comorbidities and Allergies     Current Outpatient Medications   Medication Sig Dispense Refill    acetaminophen (TYLENOL 8 HOUR) 650 MG CR tablet Take 1 tablet (650 mg total) by mouth every eight (8) hours as needed for pain.      albuterol HFA 90 mcg/actuation inhaler Inhale 2 puffs by mouth every four (4) hours as needed for wheezing. 17 g 2    atorvastatin (LIPITOR) 40 MG tablet Take 1 tablet (40 mg total) by mouth daily. 90 tablet 3    atorvastatin (LIPITOR) 40 MG tablet Take 1 tablet (40 mg) by mouth daily. 90 tablet 4    benzonatate (TESSALON PERLES) 100 MG capsule Take 1 capsule (100 mg total) by mouth Three (3) times a day as needed for cough. 30 capsule 1    bictegrav-emtricit-tenofov ala (BIKTARVY) 50-200-25 mg tablet Take 1 tablet by mouth daily. 30 tablet 1    BIKTARVY 50-200-25 mg tablet TAKE 1 TABLET BY MOUTH DAILY 30 tablet 1    blood sugar diagnostic (ON CALL EXPRESS TEST STRIP) Strp Check blood sugar as directed once a day and for symptoms of high or low blood sugar. 200 each 4    blood-glucose meter (ON CALL EXPRESS METER) Misc USE AS DIRECTED TO TEST BLOOD SUGAR 1 each 0    blood-glucose meter kit Use as instructed 1 each 0    ciprofloxacin-dexAMETHasone (CIPRODEX) 0.3-0.1 % otic suspension Administer 4 drops to the right ear two (2) times a day for 7 days. 7.5 mL 0    cloNIDine HCL (CATAPRES) 0.1 MG tablet Take 1 tablet (0.1 mg total) by mouth nightly. 90 tablet 3    cyclobenzaprine (FLEXERIL)  10 MG tablet Take 1 tablet (10 mg total) by mouth Three (3) times a day as needed. 60 tablet 2    cyclobenzaprine (FLEXERIL) 10 MG tablet Take 1 tablet (10 mg) by mouth three times daily as needed. 60 tablet 2    fluticasone propionate (FLONASE) 50 mcg/actuation nasal spray Use 2 sprays in each nostril daily. 16 g 11    lancing device (ON CALL LANCING DEVICE) Misc USE AS DIRECTED TO CHECK YOUR BLOOD SUGAR 1 each 0    lansoprazole (PREVACID) 30 MG capsule Take 1 capsule (30 mg total) by mouth daily. 90 capsule 3    lidocaine (LIDODERM) 5 % patch Place 1 patch on the affected area for 12 hours only each day (then remove patch) as directed 30 patch 2    lidocaine (LIDODERM) 5 % patch Apply 1 patch externally to the affected area for 12 hours only each day, then remove patch as directed 30 patch 2    lisinopril (PRINIVIL,ZESTRIL) 20 MG tablet Take 1 tablet (20 mg total) by mouth daily. 90 tablet 3    lisinopril (PRINIVIL,ZESTRIL) 20 MG tablet Take 1 tablet (20 mg) by mouth daily. 90 tablet 3    metFORMIN (GLUCOPHAGE) 500 MG tablet Take 1 tablet (500 mg total) by mouth in the morning and 1 tablet (500 mg total) in the evening with meals. 180 tablet 3    metFORMIN (GLUCOPHAGE) 500 MG tablet Take 1 tablet (500 mg) by mouth in the morning and in the evening with meals. 180 tablet 3    multivitamin (THERAGRAN) per tablet Take 1 tablet by mouth daily.      nicotine polacrilex (NICORETTE MINI) lozenge 4 MG Apply 1 lozenge (4 mg total) to cheek every four (4) hours as needed for smoking cessation. 72 each 2    polyethylene glycol (GOLYTELY) 236-22.74-6.74 gram solution Take by mouth as directed per [facility] GI prep instructions, for split bowel prep. 4000 mL 0    QUEtiapine (SEROQUEL) 25 MG tablet Take 1 tablet (25 mg total) by mouth nightly.      semaglutide (OZEMPIC) 1 mg/dose (4 mg/3 mL) PnIj injection Inject 1 mg under the skin every seven (7) days. 3 mL 3    semaglutide (OZEMPIC) 2 mg/dose (8 mg/3 mL) PnIj Inject 2 mg under the skin every seven (7) days. 9 mL 3    semaglutide (OZEMPIC) 2 mg/dose (8 mg/3 mL) PnIj Inject 2mg  under the skin one day a week. 9 mL 3    sertraline (ZOLOFT) 100 MG tablet Take 2 tablets (200 mg total) by mouth daily. 180 tablet 3    sertraline (ZOLOFT) 100 MG tablet Take 2 tablets (200 mg) by mouth daily 180 tablet 0     Current Facility-Administered Medications   Medication Dose Route Frequency Provider Last Rate Last Admin    triamcinolone acetonide (KENALOG-40) injection 20 mg  20 mg Intra-articular Once Basilio Cairo, MD        triamcinolone acetonide [facility]) injection 20 mg  20 mg Intra-articular Once Basilio Cairo, MD           Allergies   Allergen Reactions    Naprosyn [Naproxen] Other (See Comments)     GI bleed       Patient Active Problem List   Diagnosis    Obesity    Primary localized osteoarthrosis, lower leg    Tobacco dependence    Right foot pain    Hyperthyroidism    Bone spur    Perirectal  cyst    Pterygium eye    Diabetes mellitus type 2 in obese    Hypertension associated with diabetes (CMS-HCC)    Mixed hyperlipidemia    HIV (human immunodeficiency virus infection) (CMS-HCC)    MDD (major depressive disorder), recurrent episode, moderate (CMS-HCC)    Anal wart    Knee pain    Intestinal metaplasia of gastric mucosa    Allergic rhinitis    Right hip pain    Annual physical exam    Depression    Rectal polyp    Impacted cerumen    COPD (chronic obstructive pulmonary disease) (CMS-HCC)    Gastroesophageal reflux disease    Other chronic pain    Cervical radiculopathy    Difficulty demonstrating health literacy    Cholesteatoma of right ear    Left upper arm pain    Health care maintenance    Decreased appetite    History of colonic polyps    History of hyperthyroidism    Personal history of nicotine dependence       Reviewed and up to date in Epic.    HIV ASSOCIATED LABS:     Lab Results   Component Value Date/Time    HIVRS Not Detected 07/03/2022 02:14 PM    HIVRS Not Detected 12/26/2021 02:13 PM    HIVRS Detected (A) 07/25/2021 03:43 PM    HIVCP <20 (H) 07/25/2021 03:43 PM    HIVCP <40 (H) 03/28/2021 02:28 PM    HIVCP <40 (H) 11/22/2020 05:22 PM    ACD4 1,568 12/26/2021 02:13 PM    ACD4 1,400 03/28/2021 02:28 PM    ACD4 1,512 07/19/2020 05:01 PM       Appropriateness of Therapy     Acute infections noted within Epic:  No active infections  Patient reported infection: None    Is the medication and dose appropriate based on diagnosis, medication list, comorbidities, allergies, medical history, patient???s ability to self-administer the medication, and therapeutic goals? Yes    Prescription has been clinically reviewed: Yes      Baseline Quality of Life Assessment      How many days over the past month did your HIV  keep you from your normal activities? For example, brushing your teeth or getting up in the morning. 0    Financial Information     Medication Assistance provided: None Required    Anticipated copay of $0.00 reviewed with patient. Verified delivery address.    Delivery Information     Scheduled delivery date: 03/24/23    Expected start date: continuation of current therapy      Medication will be delivered via Same Day Courier to the prescription address in [facility].  This shipment will require a signature.      Explained the services we provide at [facility] and that each month we would call to set up refills.  Stressed importance of returning phone calls so that we could ensure they receive their medications in time each month.  Informed patient that we should be setting up refills 7-10 days prior to when they will run out of medication.  A pharmacist will reach out to perform a clinical assessment periodically.  Informed patient that a welcome packet, containing information about our pharmacy and other support services, a Notice of Privacy Practices, and a drug information handout will be sent.      The patient or caregiver noted above participated in the development of this care plan and knows  that they can request review of or adjustments to the care plan at any time.      Patient or caregiver verbalized understanding of the above information as well as how to contact the pharmacy at [phone] option 4 with any questions/concerns.  The pharmacy is open Monday through Friday 8:30am-4:30pm.  A pharmacist is available 24/7 via pager to answer any clinical questions they may have.    Patient Specific Needs     Does the patient have any physical, cognitive, or cultural barriers? No    Does the patient have adequate living arrangements? (i.e. the ability to store and take their medication appropriately) Yes    Did you identify any home environmental safety or security hazards? No    Patient prefers to have medications discussed with  Patient     Is the patient or caregiver able to read and understand education materials at a high school level or above? Yes    Patient's primary language is  English     Is the patient high risk? No    SOCIAL DETERMINANTS OF HEALTH     At the [facility], we have learned that life circumstances - like trouble affording food, housing, utilities, or transportation can affect the health of many of our patients.   That is why we wanted to ask: are you currently experiencing any life circumstances that are negatively impacting your health and/or quality of life? Patient declined to answer    Social Determinants of Health     Food Insecurity: No Food Insecurity (03/13/2023)    Hunger Vital Sign     Worried About Running Out of Food in the Last Year: Never true     Ran Out of Food in the Last Year: Never true   Internet Connectivity: Not on file   Housing/Utilities: Not on file   Tobacco Use: High Risk (03/13/2023)    Patient History     Smoking Tobacco Use: Every Day     Smokeless Tobacco Use: Never     Passive Exposure: Not on file   Transportation Needs: Unmet Transportation Needs (06/13/2017)    Received from [facility], [facility]    PRAPARE - Transportation     Lack of Transportation (Medical): Yes     Lack of Transportation (Non-Medical): Yes   Alcohol Use: Not on file   Interpersonal Safety: Not At Risk (03/13/2023)    Interpersonal Safety     Unsafe Where You Currently Live: No     Physically Hurt by Anyone: No     Abused by Anyone: No   Physical Activity: Inactive (06/13/2017)    Received from [facility], [facility]    Exercise Vital Sign     Days of Exercise per Week: 0 days     Minutes of Exercise per Session: 0 min   Intimate Partner Violence: Not At Risk (06/13/2017)    Received from [facility], [facility]    Humiliation, Afraid, Rape, and Kick questionnaire     Fear of Current or Ex-Partner: No     Emotionally Abused: No     Physically Abused: No     Sexually Abused: No   Stress: Stress Concern Present (06/13/2017)    Received from [facility], [facility]    [facility] Occupational [facility] - Occupational Stress Questionnaire     Feeling of Stress : Very much   Substance Use: Not on file (03/21/2023)   Social Connections: Somewhat Isolated (06/13/2017)    Received  from [facility], [facility]    Social Connection and Isolation Panel [NHANES]     Frequency of Communication with Friends and Family: More than three times a week     Frequency of Social Gatherings with Friends and Family: Once a week     Attends Religious Services: More than 4 times per year     Active Member of [employer] or Organizations: No     Attends Banker Meetings: Never     Marital Status: Never married   Physicist, medical Strain: Medium Risk (06/13/2017)    Received from [facility], [facility]    Overall Financial Resource Strain (CARDIA)     Difficulty of Paying Living Expenses: Somewhat hard   Depression: Not at risk (05/29/2021)    PHQ-2     PHQ-2 Score: 0   Health Literacy: Low Risk  (03/13/2023)    Health Literacy     : Never       Would you be willing to receive help with any of the needs that you have identified today? Not applicable       Roderic Palau, PharmD  [facility] Specialty Pharmacist

## 2023-03-21 NOTE — Unmapped (Signed)
 [facility] Special[facility] Medication Onboarding    Specialty Medication: BIKTARVY 50-200-25 mg tablet (bictegrav-emtricit-tenofov ala)  Prior Authorization: Not Required   Financial Assistance: No - copay  <$25  Final Copay/Day Supply: $0 / 30    Insurance Restrictions: None     Notes to Pharmacist:   Credit Card on File: not applicable    The triage team has completed the benefits investigation and has determined that the patient is able to fill this medication at [facility]. Please contact the patient to complete the onboarding or follow up with the prescribing physician as needed.

## 2023-03-24 MED FILL — CYCLOBENZAPRINE 10 MG TABLET: 20 days supply | Qty: 60 | Fill #0

## 2023-03-24 NOTE — Unmapped (Signed)
PA for lidocaine patches.

## 2023-04-01 NOTE — Unmapped (Signed)
Spoke confidentially with patient to schedule appointment for retention. Appointment is set for 04/24/2023 at 1 pm.    Barriers to Care:  Are you experiencing any barriers to care like: transportation to medical appointments, stable housing, lack of food, etc.? No  Are you currently taking your medication(s) as prescribed? Yes    Notes: Patient states being med adherent and would also like a text message reminder for their upcoming follow-up appt.     Referral Outcome: N/A    [facility] Care Plan: Out of Care Patient (>6 months)    Patient's last ID clinic appt: 07/03/2022    Goal: Patient will have an office visit within 6 months of the last office visit unless stated otherwise by their provider.     Contact Interventions may include:  Placing phone calls to the patient and/or listed contacts.  Sending the patient an Out of Care text message through the [employer] or [facility] cell phone (if texting agreement is signed).  Sending the patient an Out of Care MyChart/letter.  Contacting the patient's most recent pharmacies as needed for additional information such as contact information or refill history.  Contacting the patient's case manager/caregiver as needed for additional information, if assigned.  Researching other online resources as needed.     If the patient is reached, the [employer] will attempt to:  Update all contact information including primary and secondary phone numbers and emergency contact details and address. Confirm access to MyChart.  Complete the Out of Care Intervention questionnaire with the patient.  Explore and discuss any barriers to visit attendance the patient may be experiencing that result in missed appointments, no-shows, cancellations, or lack of a scheduled follow-up appointment. These may include communication barriers (language, phone access, texting, MyChart), transportation, financial concerns, time off work, mental health/substance use, and/or multiple medical appointments.  Make appropriate referrals and link the patient to clinic nurses, social workers, benefits counselors, or other support services in the [facility] ID clinic to address concerns or barriers to care.  Schedule a follow-up appointment at an appropriate interval based on a completed assessment.  Confirm the patient has access to ART medications until that appointment. If the patient is not currently taking ART, needs refills, or is having difficulty taking the medication, refer to the clinical staff for follow up via [employer].    Monitor attendance of the patient's scheduled appointment and provide reminder calls/texts/messages consistent with patient preference, if needed.  Continue follow-up if indicated.    Expected Outcome:  Patient will have an office visit within 6 months of the last office visit unless stated otherwise by their provider.    If the patient has an urgent medical problem send a chat message to the clinic nursing staff for immediate follow-up     **If the patient is not able to be located or retained in HIV care, a referral will be placed to the [facility] Counselor for further follow-up.    Duration of Service: 15 minutes    Carissa Crews  Linkage and Scientist, research (medical)

## 2023-04-01 NOTE — Unmapped (Signed)
Received medical clearance forms from [employer] [facility] [employer]. Forms placed on providers dek for review and completion.

## 2023-04-07 NOTE — Unmapped (Unsigned)
Assessment and Plan:     Need for vaccination  -     INFLUENZA VACCINE IIV3(IM)(PF)6 MOS UP    Preoperative clearance  -     Comprehensive Metabolic Panel; Future  -     CBC w/ Differential; Future  -     ECG 12 Lead      diagnoses reviewed with patient  Dental implant surgery form reviewed and completed with patient. Copy scanned to patient's chart.   Recommend COVID booster at the pharmacy.  EKG done in clinic today. Results discussed with patient. From a cardiovascular standpoint, patient is cleared for low-risk dental surgery.  RCRI score is 1. Functional capacity adequate.  Ordering future CMP and CBC w/ diff. Patient can have these drawn at his upcoming visit with ID on 04/24/23 when they get labs.  Rx for [employer] sent as above for cough; reviewed risks/benefits, possible side effects and patient verbalizes understanding      Barriers to recommended plan: None identified    I personally spent *** minutes face-to-face and non-face-to-face in the care of this patient, which includes all pre, intra, and post visit time (reviewing, evaluating and discussing pertinent records for patient's care) on the date of service.     No follow-ups on file.    Subjective:     HPI: Nathaniel Gibson is a 62 y.o. male here for completion of surgical clearance forms. Patient has upcoming dental implant surgery. Denies any red flag cardiovascular symptoms today. No personal history of heart disease. Patient was seen by Cardiology and had negative exercise stress test in 2019.    Would like to update flu vaccine today. Patient also notes he has had a cold and cold and would like cough medicine.    HPI     ROS:   Review of Systems     Review of systems negative unless otherwise noted as per HPI.      The following portions of the patient's history were reviewed and updated as appropriate: allergies, current medications, past family history, past medical history, past social history, past surgical history and problem list. Objective:     Vitals:    04/09/23 1026   BP: 130/82   Pulse: 92   Temp: 36.9 ??C (98.4 ??F)   SpO2: 94%     Body mass index is 36.57 kg/m??.    Physical Exam  Vitals and nursing note reviewed.   Constitutional:       General: He is not in acute distress.     Appearance: Normal appearance. He is not ill-appearing, toxic-appearing or diaphoretic.   Cardiovascular:      Rate and Rhythm: Normal rate and regular rhythm.      Heart sounds: Normal heart sounds.   Pulmonary:      Effort: Pulmonary effort is normal. No respiratory distress.      Breath sounds: Normal breath sounds. No stridor. No wheezing, rhonchi or rales.   Chest:      Chest wall: No tenderness.   Musculoskeletal:      Cervical back: Neck supple.   Neurological:      Mental Status: He is alert. Mental status is at baseline.          Allergies:     Naprosyn [naproxen]    [facility]:     Medication adherence and barriers to the treatment plan have been addressed. Opportunities to optimize healthy behaviors have been discussed. Patient / caregiver voiced understanding.      I  attest that I, Evelise Reine F Norvell Caswell, personally documented this note while acting as scribe for [name], MD.      [name], Scribe.  04/09/2023     The documentation recorded by the scribe accurately reflects the service I personally performed and the decisions made by me.    [name], MD

## 2023-04-08 DIAGNOSIS — K219 Gastro-esophageal reflux disease without esophagitis: Principal | ICD-10-CM

## 2023-04-08 MED ORDER — LANSOPRAZOLE 30 MG CAPSULE,DELAYED RELEASE
ORAL_CAPSULE | Freq: Every day | ORAL | 3 refills | 90 days
Start: 2023-04-08 — End: ?

## 2023-04-08 NOTE — Unmapped (Signed)
Received chronic condition release form. Forms placed on providers desk for review and completion.

## 2023-04-08 NOTE — Unmapped (Signed)
Patient is requesting the following medication(s) for refill: cyclobenzaprine (FLEXERIL) 10 MG tablet , Pharmacy name and address has been verified.   [facility].

## 2023-04-08 NOTE — Unmapped (Signed)
Called and spoke to [facility] who stated that there is a denial on file already for lidocaine patches.

## 2023-04-08 NOTE — Unmapped (Signed)
Completed forms placed at front desk to be faxed.

## 2023-04-08 NOTE — Unmapped (Signed)
[facility] calling leaving voicemail they are wanting to update status on patients lidocaine Rx please return call to ([phone].

## 2023-04-09 ENCOUNTER — Ambulatory Visit: Admit: 2023-04-09 | Discharge: 2023-04-10 | Payer: MEDICARE

## 2023-04-09 DIAGNOSIS — Z01818 Encounter for other preprocedural examination: Principal | ICD-10-CM

## 2023-04-09 DIAGNOSIS — Z23 Encounter for immunization: Principal | ICD-10-CM

## 2023-04-09 MED ORDER — CYCLOBENZAPRINE 10 MG TABLET
ORAL_TABLET | Freq: Three times a day (TID) | ORAL | 2 refills | 20 days | Status: CP | PRN
Start: 2023-04-09 — End: ?
  Filled 2023-04-09: qty 60, 20d supply, fill #1
  Filled 2023-04-29: qty 60, 20d supply, fill #0

## 2023-04-09 MED ORDER — LANSOPRAZOLE 30 MG CAPSULE,DELAYED RELEASE
ORAL_CAPSULE | Freq: Every day | ORAL | 3 refills | 90 days
Start: 2023-04-09 — End: ?

## 2023-04-09 NOTE — Unmapped (Signed)
Unable to complete refill request, medication is not included in the refill protocol.   Please review below request.     Patient is requesting the following refill  Requested Prescriptions     Pending Prescriptions Disp Refills    cyclobenzaprine (FLEXERIL) 10 MG tablet 60 tablet 2     Sig: Take 1 tablet (10 mg total) by mouth Three (3) times a day as needed.     Refused Prescriptions Disp Refills    lansoprazole (PREVACID) 30 MG capsule 90 capsule 3     Sig: Take 1 capsule (30 mg total) by mouth daily.     Refused By: Cleophus Molt     Reason for Refusal: Patient has requested refill too soon       Recent Visits  Date Type Provider Dept   03/13/23 Office Visit [name], MD [facility] [address] [facility]   11/13/22 Office Visit [name], MD [place] [facility] [address] [facility]   10/02/22 Office Visit [name], MD [place] [address] [facility]   07/10/22 Office Visit [name], MD [place] [facility] [address] [facility]   05/09/22 Office Visit [name], MD [facility] [facility] [address] [place]   Showing recent visits within past 365 days and meeting all other requirements  Today's Visits  Date Type Provider Dept   04/09/23 Office Visit [name], MD [place] [facility] [address] [place]   Showing today's visits and meeting all other requirements  Future Appointments  Date Type Provider Dept   05/16/23 Appointment [name], MD [place] [facility] [address] [place]   Showing future appointments within next 365 days and meeting all other requirements       Labs: Not applicable this refill

## 2023-04-09 NOTE — Unmapped (Signed)
 Patient in clinic for office visit, FluLaval - 6 months and up Flu Vaccine vaccine administered per protocol,NCIR and/or EPIC chart reviewed and vaccine accuracy verified with teammates: 2024-2025 Flu vaccine season , patient eligible to receive vacstock: Private Vaccine, vaccine administered Right Deltoid, pt tolerated well, no s/s of a reaction noted, VIS given to patient

## 2023-04-13 ENCOUNTER — Ambulatory Visit: Admit: 2023-04-13 | Discharge: 2023-04-14 | Payer: MEDICARE

## 2023-04-15 NOTE — Unmapped (Signed)
PA initiated for Ozempic

## 2023-04-21 NOTE — Unmapped (Signed)
PA approved through May 05, 2024.       Pt notified via MyChart.

## 2023-04-23 DIAGNOSIS — G8929 Other chronic pain: Principal | ICD-10-CM

## 2023-04-23 DIAGNOSIS — K219 Gastro-esophageal reflux disease without esophagitis: Principal | ICD-10-CM

## 2023-04-23 DIAGNOSIS — M25551 Pain in right hip: Principal | ICD-10-CM

## 2023-04-23 MED ORDER — LANSOPRAZOLE 30 MG CAPSULE,DELAYED RELEASE
ORAL_CAPSULE | Freq: Every day | ORAL | 3 refills | 90.00 days | Status: CP
Start: 2023-04-23 — End: ?
  Filled 2023-04-29: qty 90, 90d supply, fill #0

## 2023-04-23 MED ORDER — TRAMADOL 50 MG TABLET
ORAL_TABLET | Freq: Two times a day (BID) | ORAL | 0 refills | 15.00 days | Status: CP | PRN
Start: 2023-04-23 — End: 2023-05-22
  Filled 2023-04-29: qty 60, 15d supply, fill #0

## 2023-04-23 NOTE — Unmapped (Signed)
[facility] Delivery Pharm[facility] Refill Coordination Note    Specialty Medication(s) to be Shipped:   Infectious Disease: Biktarvy    Other medication(s) to be shipped:  albuterol, tramadol, lidocaine patches, sertraline, Prevacid     Nathaniel Gibson, DOB: 15-Aug-1960  Phone: There are no phone numbers on file.      All above HIPAA information was verified with patient.     Was a Nurse, learning disability used for this call? No    Completed refill call assessment today to schedule patient's medication shipment from the [facility]  [phone]).  All relevant notes have been reviewed.     Specialty medication(s) and dose(s) confirmed: Regimen is correct and unchanged.   Changes to medications: Nathaniel Gibson reports no changes at this time.  Changes to insurance: No  New side effects reported not previously addressed with a pharmacist or physician: None reported  Questions for the pharmacist: No    Confirmed patient received a Conservation officer, historic buildings and a Surveyor, mining with first shipment. The patient will receive a drug information handout for each medication shipped and additional FDA Medication Guides as required.       DISEASE/MEDICATION-SPECIFIC INFORMATION        N/A    SPECIALTY MEDICATION ADHERENCE     Medication Adherence    Patient reported X missed doses in the last month: 0  Specialty Medication: BIKTARVY 50-200-25 mg tablet (bictegrav-emtricit-tenofov ala)  Patient is on additional specialty medications: No  Patient is on more than two specialty medications: No  Any gaps in refill history greater than 2 weeks in the last 3 months: no  Demonstrates understanding of importance of adherence: yes  Informant: patient  Confirmed plan for next specialty medication refill: delivery by pharmacy  Refills needed for supportive medications: not needed          Refill Coordination    Has the Patients' Contact Information Changed: No  Is the Shipping Address Different: No         Were doses missed due to medication being on hold? No    BIKTARVY 50-200-25   mg: 15 doses of medicine on hand       REFERRAL TO PHARMACIST     Referral to the pharmacist: Not needed      SHIPPING     Shipping address confirmed in Epic.       Delivery Scheduled: Yes, Expected medication delivery date: 04/29/23.     Medication will be delivered via Next Day Courier to the prescription address in Epic WAM.    [name]   [facility]  Specialty Technician

## 2023-04-24 ENCOUNTER — Ambulatory Visit: Admit: 2023-04-24 | Discharge: 2023-04-25 | Payer: MEDICARE

## 2023-04-24 DIAGNOSIS — Z113 Encounter for screening for infections with a predominantly sexual mode of transmission: Principal | ICD-10-CM

## 2023-04-24 DIAGNOSIS — Z0184 Encounter for antibody response examination: Principal | ICD-10-CM

## 2023-04-24 DIAGNOSIS — Z23 Encounter for immunization: Principal | ICD-10-CM

## 2023-04-24 DIAGNOSIS — Z5181 Encounter for therapeutic drug level monitoring: Principal | ICD-10-CM

## 2023-04-24 DIAGNOSIS — Z9189 Other specified personal risk factors, not elsewhere classified: Principal | ICD-10-CM

## 2023-04-24 DIAGNOSIS — Z79899 Other long term (current) drug therapy: Principal | ICD-10-CM

## 2023-04-24 DIAGNOSIS — Z01818 Encounter for other preprocedural examination: Principal | ICD-10-CM

## 2023-04-24 DIAGNOSIS — B20 Human immunodeficiency virus [HIV] disease: Principal | ICD-10-CM

## 2023-04-24 LAB — COMPREHENSIVE METABOLIC PANEL
ALBUMIN: 3.7 g/dL (ref 3.4–5.0)
ALKALINE PHOSPHATASE: 96 U/L (ref 46–116)
ALT (SGPT): 13 U/L (ref 10–49)
ANION GAP: 10 mmol/L (ref 5–14)
AST (SGOT): 14 U/L (ref <=34)
BILIRUBIN TOTAL: 0.4 mg/dL (ref 0.3–1.2)
BLOOD UREA NITROGEN: 11 mg/dL (ref 9–23)
BUN / CREAT RATIO: 12
CALCIUM: 9.7 mg/dL (ref 8.7–10.4)
CHLORIDE: 103 mmol/L (ref 98–107)
CO2: 27.8 mmol/L (ref 20.0–31.0)
CREATININE: 0.95 mg/dL (ref 0.73–1.18)
EGFR CKD-EPI (2021) MALE: 90 mL/min/{1.73_m2} (ref >=60)
GLUCOSE RANDOM: 86 mg/dL (ref 70–179)
POTASSIUM: 3.6 mmol/L (ref 3.4–4.8)
PROTEIN TOTAL: 7.7 g/dL (ref 5.7–8.2)
SODIUM: 141 mmol/L (ref 135–145)

## 2023-04-24 LAB — CBC W/ AUTO DIFF
BASOPHILS ABSOLUTE COUNT: 0.1 10*9/L (ref 0.0–0.1)
BASOPHILS RELATIVE PERCENT: 0.9 %
EOSINOPHILS ABSOLUTE COUNT: 0.1 10*9/L (ref 0.0–0.5)
EOSINOPHILS RELATIVE PERCENT: 1.7 %
HEMATOCRIT: 43.1 % (ref 39.0–48.0)
HEMOGLOBIN: 14.8 g/dL (ref 12.9–16.5)
LYMPHOCYTES ABSOLUTE COUNT: 2.8 10*9/L (ref 1.1–3.6)
LYMPHOCYTES RELATIVE PERCENT: 45.7 %
MEAN CORPUSCULAR HEMOGLOBIN CONC: 34.4 g/dL (ref 32.0–36.0)
MEAN CORPUSCULAR HEMOGLOBIN: 31.2 pg (ref 25.9–32.4)
MEAN CORPUSCULAR VOLUME: 90.7 fL (ref 77.6–95.7)
MEAN PLATELET VOLUME: 8.4 fL (ref 6.8–10.7)
MONOCYTES ABSOLUTE COUNT: 0.7 10*9/L (ref 0.3–0.8)
MONOCYTES RELATIVE PERCENT: 10.6 %
NEUTROPHILS ABSOLUTE COUNT: 2.5 10*9/L (ref 1.8–7.8)
NEUTROPHILS RELATIVE PERCENT: 41.1 %
PLATELET COUNT: 243 10*9/L (ref 150–450)
RED BLOOD CELL COUNT: 4.76 10*12/L (ref 4.26–5.60)
RED CELL DISTRIBUTION WIDTH: 15.1 % (ref 12.2–15.2)
WBC ADJUSTED: 6.1 10*9/L (ref 3.6–11.2)

## 2023-04-24 LAB — LYMPH MARKER LIMITED,FLOW
ABSOLUTE CD3 CNT: 1960 {cells}/uL (ref 915–3400)
ABSOLUTE CD4 CNT: 1484 {cells}/uL (ref 510–2320)
ABSOLUTE CD8 CNT: 448 {cells}/uL (ref 180–1520)
CD3% (T CELLS): 70 % (ref 61–86)
CD4% (T HELPER): 53 % (ref 34–58)
CD4:CD8 RATIO: 3.3 (ref 0.9–4.8)
CD8% T SUPPRESR: 16 % (ref 12–38)

## 2023-04-24 MED ORDER — SERTRALINE 100 MG TABLET
ORAL_TABLET | 0 refills | 0.00 days
Start: 2023-04-24 — End: ?

## 2023-04-24 NOTE — Unmapped (Signed)
Infectious Diseases Clinic        Assessment/Plan:     HIV  Overall doing well. He never misses doses of Biktarvy (BIC/FTC/TAF). Still using pill box  This is a pt of Dr. Juel Burrow who he would like to continue to see. Unclear how he got on my schedule but he has had no new concerns.    Accesses meds via through [facility].    CD4 counts have been over 300 for >2Y on suppressive ART.   CD4 rechecks can be annual.    Discussed ARV adherence.    Lab Results   Component Value Date    ACD4 1,568 12/26/2021    CD4 56 12/26/2021    HIVRS Not Detected 07/03/2022    HIVCP <20 (H) 07/25/2021       Continue current therapy  CD4, HIV RNA, and safety labs (full return panel)  Discussed importance of ARV adherence.    Rectal bleeding (pain resolved) - not complaints today  - h/o anal warts  - 7/21 pap negative for intraepithelial lesion or malignancy, referred for anoscopy given persistence of symptoms and history of warts, bleeding recurred in interim.  - no anal sex, no pain, rectal GC/Chlamydia and RPR all negative previously  - 10/19/20 anoscopy Squamocolumnar junctional mucosa with no evidence of intraepithelial or invasive neoplasia   - 12/2021 anal pap ASCUS - repeat 12/2022    Knee/hip pain - chronic stable, no new complaints  Gets injections in knee, which help.     Weight fluctuations/obesity - not discussed today  PCP started ozempic in Feb 2024. Missed one dose last week. Reports at 0.25 weekly dose. Does report his weight being up today. No altered taste, which had previously been an issue.  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 Plan for q33mo f/u  - continue ENT follow up  - inspection of ear today did not reveal any significant impaction      Oral/lip lesion - stable, not discussed  - saw oral medicine 02/2021, plan for monitoring. No change on exam today    HTN/ T2DM  - follows w family medicine. No recent changes    Teeth extraction  - has had all teeth extracted, going to get dentures fit eventually but doesn't seem to be impacting PO intake per patient  - Still awaiting his dentures which he is getting soon    Depression and anxiety  Patient reports stable mood to me. Had previously expressed interest in [facility] psych but not clear if he was able to establish  - managed through [facility] counseling and psych med mgmt, on sertraline and quetiapine. Though run out of sertraline due ot pharmacy access issues?  - messaged SW for assistance with possible transition to [employer] insecurity - chronic  Retired Administrator, sports.   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 He started smoking about 49 years ago. He has a  49 pack-year smoking history. He has never used smokeless tobacco.  Still smoking 10+ cig d. Counseled extensively on importance of quitting by this Clinical research associate. Increased in setting of GodSon stress when he moved back in with patient. Not able to cut back at this point    Sexual health & secondary prevention  Not sexually active, been 4+ years since sex, is thinking about meeting a person in [place]  Since last visit, no sex and has not had add'l STI screening.   He does routinely discuss status with partner(s). Sometimes uses condoms.      Lab Results   Component Value Date    RPR Nonreactive 07/19/2020    CTNAA Negative 07/19/2020    CTNAA Negative 06/08/2019    CTNAA Negative 06/08/2019    CTNAA Negative 06/08/2019    GCNAA Negative 07/19/2020    GCNAA Negative 06/08/2019    GCNAA Negative 06/08/2019    GCNAA Negative 06/08/2019 SPECSOURCE Rectum 07/19/2020    SPECSOURCE Throat 06/08/2019    SPECSOURCE Urine 06/08/2019    SPECSOURCE Rectum 06/08/2019       GC/CT NAATs -- not being checked routinely for this patient  RPR -- repeat 12 months after prior      Health maintenance    Wt Readings from Last 5 Encounters:   04/24/23 (!) 128.2 kg (282 lb 9.6 oz)   04/09/23 (!) 130.9 kg (288 lb 9.6 oz)   03/13/23 (!) 128.9 kg (284 lb 3.2 oz)   11/13/22 (!) 136.3 kg (300 lb 6.4 oz)   10/02/22 (!) 133.5 kg (294 lb 4 oz)     Oral health  He does not have a dentist. Last dental exam in past 6 months.  Awaiting denture fit    Eye health  He does  use corrective lenses. Last eye exam in past 12 months.     Metabolic conditions  Lab Results   Component Value Date    CREATININE 0.79 07/03/2022    GLUCOSEU Negative 11/03/2018    A1C 6.1 (A) 03/13/2023     # Diabetes - managed through primary care  # Kidney health - UA needed but deferred to future visit  # Bone health - managed through primary care    Communicable diseases  Lab Results   Component Value Date    QFTTBGOLD Negative 05/09/2017    HEPCAB Nonreactive 02/17/2020    HCVRNA Not Detected 01/10/2021     # TB screening - no longer needed; negative IGRA, low risk  # HCV screening - negative Ab; rescreen w/Ab q1-2y    Cancer screening  Lab Results   Component Value Date    PSASCRN 0.9 03/24/2012    PSA 0.92 02/17/2020    FINALDX  12/26/2021     A. Anal pap, cytology:  - Specimen satisfactory for evaluation including the presence of rectoglandular epithelium  - Atypical squamous cells of undetermined significance (ASC-US)    This electronic signature is attestation that the pathologist personally reviewed the submitted material(s) and the final diagnosis reflects that evaluation.       # Breast - not applicable  # Cervical - not applicable    # Anorectal -  repeat 12/2022  # Colorectal -  colonoscopy and egd for melana on 07/17/2017 at OSH - findings w perinal/dre normal, sesile polyps in rectum. needs repeat surveillence in 62yrs (2024)  #  Liver - not applicable  # Lung - not yet done  # Prostate - not yet done; low risk  Cardiovascular disease  Lab Results   Component Value Date    CHOL 84 03/21/2022    HDL 32 (L) 03/21/2022    LDL 42 03/21/2022    NONHDL 52 (L) 03/21/2022    TRIG 52 03/21/2022     # The 10-year ASCVD risk score (Arnett DK, et al., 2019) is: 35.9%  - is not taking aspirin (h/o GIB)  - is taking statin  - BP control good  - current smoker    Immunization History   Administered Date(s) Administered    COVID-19 VAC,BIVALENT,MODERNA(BLUE CAP) 07/30/2021    COVID-19 VACC,MRNA(BOOSTER)OR(6-38YR)MODERNA 10/19/2020    COVID-19 VACCINE,MRNA(MODERNA)(PF) 07/14/2019, 10/08/2019, 04/11/2020    Covid-19 Vac, (37yr+) (Spikevax) Monovalent Moderna 03/18/2022    INFLUENZA VACCINE IIV3(IM)(PF)6 MOS UP 04/09/2023    Influenza Vaccine Quad(IM)6 MO-Adult(PF) 02/08/2013, 04/18/2015, 02/26/2016, 04/16/2017, 05/19/2018, 05/17/2019, 02/17/2020, 03/28/2021, 03/18/2022    Meningococcal Conjugate MCV4P 06/19/2020, 08/21/2020    PNEUMOCOCCAL POLYSACCHARIDE 23-VALENT 06/11/2013, 05/24/2017    Pneumococcal Conjugate 13-Valent 05/19/2018    SHINGRIX-ZOSTER VACCINE (HZV),RECOMBINANT,ADJUVANTED(IM) 02/17/2020, 06/19/2020    TdaP 08/03/2013    ZOSTAVAX - ZOSTER VACCINE, LIVE, SQ 07/08/2014       Lab Results   Component Value Date    HEPAIGG Reactive (A) 05/09/2017    HEPBSAB Reactive (A) 05/09/2017     Check MMR serologies    Screening ordered today: none  Immunizations ordered today: none    DAIGNOSES and ORDERS for today's visit:     Diagnosis ICD-10-CM Associated Orders   1. HIV disease (CMS-HCC)  B20 HIV RNA, Quantitative, PCR     Lymphocyte Markers Limited      2. Therapeutic drug monitoring  Z51.81 HIV RNA, Quantitative, PCR     Lymphocyte Markers Limited      3. On highly active antiretroviral therapy (HAART)  Z79.899 HIV RNA, Quantitative, PCR     Lymphocyte Markers Limited      4. At risk for adverse drug reaction  Z91.89 5. Screen for sexually transmitted diseases  Z11.3 Syphilis Screen      6. Need for COVID-19 vaccine  Z23 COVID-19 VAC, (65YR+) (COMIRNATY) MRNA PFIZER      COVID-19 VAC, (65YR+) (COMIRNATY) MRNA PFIZER       7. Immunity status testing  Z01.84 Rubeola antibody IgG     Mumps antibody, IgG     Rubella Antibody, IgG      8. Preoperative clearance  Z01.818 Comprehensive Metabolic Panel     CBC w/ Differential        []  I personally spent 36 minutes face-to-face and non-face-to-face in the care of this patient, which includes all pre, intra, and post visit time on the date of service.  All documented time was specific to the E/M visit and does not include any procedures that may have been performed.  []  Billed by E&M    Disposition    Return to clinic 5-6 months or sooner if needed.    To do @ next RTC    Weight loss  [place] access  Smoking cessation  Talk to PCP about BG monitor and finger pricks                Chief Complaint   Follow-up HIV    HPI  In addition to details in A&P above:  Nathaniel Gibson is a 62 y.o. male who presents for HIV care. Newly Dx in late 2018; CD4 629/31% at time of Dx.    Has complex medical history - see notes  by problem above.     HIV:   - no issues taking biktarvy. No missed doses. Using pill box. No recent hospitalizations    Pain:  - persistent pain in hips and knees. Better with injections, but overdue for injection    Substance use  - smoking 10+ cigg/day. Had been doing better until his godson was back - back up to >10 cigg/day. Not able to cut back    God son is back in his life. Did enjoy living alone for 2.5 months, not loving having him back in the house. Still enjoying gardening    Says mood is pretty good    Is out of sertraline, pantoprazole.    Has a few weeks of Biktarvy left.    04/24/2023  Went to [place]. Was there for 3 months and then decided to come back because his doctors are here. He loved it and plans to got back for another 3 month stent but will come back. Flight was 3 days to get there. No illnesses and lost weight. Got back Nov 1st.  Lost 17 lbs while there. Now gained 4 back  Has 2 dogs and she puppies in August.  Compliant with Biktarvy  Did talk to Tia about a study and is interested.  No sex for 3-4 years.  Still smoking. 1/2 PPD        Past Medical History:   Diagnosis Date    Acute gastric ulcer with hemorrhage 03/18/2022    Allergic rhinitis     Anemia 07/23/2017    Diabetes mellitus (CMS-HCC)     Disease of thyroid gland     Effusion of lower leg joint     GERD (gastroesophageal reflux disease)     High cholesterol     HIV (human immunodeficiency virus infection) (CMS-HCC)     Hypertension     Localized, primary osteoarthritis of lower leg     Obesity, unspecified     Organic sleep apnea, unspecified     Pain in joint, multiple sites     Pterygium, unspecified     Smoker     Snores     Unspecified gingival and periodontal disease          Social History  Housing - in house in house with [employer] / Work - Not in school.  previously worked as Administrator, sports    Tobacco - He reports that he has been smoking cigarettes. He started smoking about 49 years ago. He has a 49 pack-year smoking history. He has never used smokeless tobacco.   Drinking ever few weeks    Substance use - active marijuana       Review of Systems  As per HPI. All others negative.      Medications and Allergies  He has a current medication list which includes the following prescription(s): atorvastatin, biktarvy, lansoprazole, lisinopril, metformin, multivitamin, sertraline, acetaminophen, albuterol, atorvastatin, benzonatate, biktarvy, on call express test strip, blood-glucose meter, blood-glucose meter, clonidine hcl, cyclobenzaprine, cyclobenzaprine, fluticasone propionate, lancing device, lidocaine, lidocaine, lisinopril, metformin, nicotine polacrilex, polyethylene glycol, quetiapine, ozempic, semaglutide, ozempic, sertraline, and tramadol, and the following Facility-Administered Medications: triamcinolone acetonide and triamcinolone acetonide.    Allergies: Naprosyn [naproxen]      Family History  His family history includes Cancer in his mother; Diabetes in his father and mother; Heart disease in his father; Hypertension in his mother; Kidney disease in his mother; No Known Problems in his brother, maternal aunt, maternal grandfather, maternal grandmother, maternal uncle, paternal  aunt, paternal grandfather, paternal grandmother, paternal uncle, sister, and another family member.                   BP 124/83 (BP Site: L Arm, BP Position: Sitting, BP Cuff Size: Medium)  - Pulse 99  - Temp 37 ??C (98.6 ??F) (Tympanic)  - Ht 188 cm (6' 2)  - Wt (!) 128.2 kg (282 lb 9.6 oz)  - BMI 36.28 kg/m??      Alert appearing obese gentleman NAD, engaged, engages throughout our discussion  Sclerae anicteric, fleshy overgrowth appreciated R sclera and associated scleral injection, stable  Edentulous. Hyperpigmented lesion on lower lip <1cm, stable  Ears bilaterally examined without significant impaction and Tms visualized and nl  RRR, no LEE  CTAB  Abdomen obese  GU deferred  Rectal external deferred  No obvious rashes  AOx3  Normal affect, good eye contact, fluent speech          [id number]

## 2023-04-25 LAB — HIV RNA, QUANTITATIVE, PCR
HIV RNA QNT RSLT: DETECTED — AB
HIV RNA: <20 {copies}/mL — ABNORMAL HIGH (ref <0)

## 2023-04-25 LAB — SYPHILIS SCREEN: SYPHILIS RPR SCREEN: NONREACTIVE

## 2023-04-28 LAB — MUMPS ANTIBODY, IGG: MUMPS IGG ANTIBODY: POSITIVE

## 2023-04-28 LAB — RUBEOLA ANTIBODY IGG: RUBEOLA IGG ANTIBODY: POSITIVE

## 2023-04-28 LAB — RUBELLA ANTIBODY, IGG: RUBELLA IGG SCREEN: POSITIVE

## 2023-04-29 MED FILL — BIKTARVY 50 MG-200 MG-25 MG TABLET: 30 days supply | Qty: 30 | Fill #1

## 2023-04-29 MED FILL — ALBUTEROL SULFATE HFA 90 MCG/ACTUATION AEROSOL INHALER: RESPIRATORY_TRACT | 34 days supply | Qty: 17 | Fill #2

## 2023-05-02 ENCOUNTER — Ambulatory Visit
Admit: 2023-05-02 | Discharge: 2023-05-03 | Payer: MEDICARE | Attending: Emergency Medicine | Primary: Emergency Medicine

## 2023-05-02 DIAGNOSIS — E669 Obesity, unspecified: Principal | ICD-10-CM

## 2023-05-02 DIAGNOSIS — M160 Bilateral primary osteoarthritis of hip: Principal | ICD-10-CM

## 2023-05-02 DIAGNOSIS — E1169 Type 2 diabetes mellitus with other specified complication: Principal | ICD-10-CM

## 2023-05-02 MED ORDER — ON CALL EXPRESS TEST STRIP
4 refills | 0.00 days | Status: CP
Start: 2023-05-02 — End: ?

## 2023-05-02 MED ORDER — LANCING DEVICE
4 refills | 0.00 days | Status: CP
Start: 2023-05-02 — End: ?

## 2023-05-02 MED ADMIN — triamcinolone acetonide (KENALOG) injection 20 mg: 20 mg | INTRA_ARTICULAR | @ 14:00:00 | Stop: 2023-05-02

## 2023-05-02 NOTE — Unmapped (Signed)
PlIntra-articular Hip injection for Hip arthritis    You had a steroid injection today for your hip osteoarthritis. The steroid we use is called Kenalog.     You received a corticosteroid injection to reduce pain and inflammation.  Please note that it can take up to 2 weeks for this injection to fully work.  While many people will feel relief sooner, please be patient.      The injection contained a corticosteroid and a numbing agent.  The numbing agent can last for 1-6 hours.  After this wears off you may have increased pain until the steroid has a chance to work.    What are some of the possible side effects of a steroid injection?    Common side effects:  temporarily elevated blood sugar (in diabetic patients) that can last a few days   flushing of the skin, especially the face  temporary rise in blood pressure  discoloration or atrophy of the skin at the injection site    Call your doctor at once if you have:  persistent worsening pain or swelling, fever;  blurred vision, tunnel vision, eye pain, or seeing halos around lights;  fast or slow heartbeats;  increased blood pressure that is associated with severe headache, blurred vision, pounding in your neck or ears, anxiety, nosebleed;  headaches, ringing in your ears, dizziness, nausea, vision problems, pain behind your eyes    This is not a complete list of side effects and others may occur. Call your doctor for medical advice about side effects.     What other drugs may be affected after the injection?  Many drugs can interact with steroids. Not all possible interactions are listed here. Tell your doctor about all your current medicines and any you start or stop using, especially:  an antibiotic or antifungal medication;  birth control pills or hormone replacement therapy;  a blood thinner (warfarin, Coumadin, and others);  a diuretic or water pill;  insulin or oral diabetes medicine;  medicine to treat tuberculosis;  a nonsteroidal anti-inflammatory drug or NSAID (aspirin, ibuprofen, naproxen, diclofenac, indomethacin, Advil, Aleve, Celebrex, and many others); or  seizure medication.      You can resume your normal daily activities, but consider resting the injected area for the next few days.             You can resume your normal daily activities, but you should not seek out any lower body exercise or vigorous activity of any kind for the next 2 weeks to give your body a chance to let the steroids take effect.     Plan to limit your walking for the next two weeks to just activities of daily living. You can take Tylenol as needed before the steroids begin to work. You may notice your hip feels full after the injection.  This should settle down after a couple of hours. You should not soak in a bath or get in a pool for 24 hours following the injection.      an:      Thank you for coming to [facility] and our clinic today!     We aim to provide you with the highest quality, individualized care.  If you have any unanswered questions after the visit, please do not hesitate to reach out to [place] on MyChart or leave a message for the nurse.  ?  MyChart messages: These messages can be sent to your provider and will be checked by their clinical  support staff.? The messages are checked throughout the day during normal business hours from 8:30 am-4:00 pm Monday-Friday, however responses may take up to 48 hours.? Please use this method of communication for non-urgent and non-emergent concerns, questions, refill requests or inquiries only.? ?Our team will help respond to all of your questions.? Please note that you may be asked to see a provider by either a telehealth or in person visit if it is deemed your questions are best handled in the clinic setting in person.??  ?  Please keep in mind, these messages are not real time communications, so be patient when waiting for a response.    If you do not have access to MyChart, do not know how to use MyChart or have an issue that may require more extensive discussion, please call the nurses' call line: [phone].? This line is checked throughout the day and will be responded to as time allows.? Please note that return calls could take up to 48 hours, depending on the nature of the need.?  ?  If you have an issue that requires emergent attention that cannot wait; either call the Orthopaedics resident on call at [phone], consider coming to our [facility] walk-in clinic, or go to the nearest Emergency Department.    If you need to schedule future appointments, please call [phone].     We look forward to seeing you again in the future and appreciate you choosing [facility] for your care!    Thank you,                We provide innovative and comprehensive patient centered care that is supported by evidence-based research                                                                                                    RESEARCH PARTICIPATION    Please check out our current research studies to see if you or someone you know may qualify at:    [email or web address]

## 2023-05-02 NOTE — Unmapped (Signed)
SPORTS MEDICINE RETURN VISIT    ASSESSMENT AND PLAN      Diagnosis ICD-10-CM Associated Orders   1. Primary osteoarthritis of hips, bilateral  M16.0              Had a good discussion Nathaniel Gibson he continues to do well his knee and hip arthritis gets relief from the injections he is continue to work on weight loss and we encouraged him to continue with this as he is doing great not ready for hip replacement at this time but continuing to get medically optimized and after further discussion we decided proceed with repeat injections today    He will follow-up with his primary doctor for pain management    No follow-ups on file.    Procedure(s):  PROCEDURE NOTE:  Hip injection Bilateral  Consent   After discussing the various treatment options for the condition, It was agreed that a corticosteroid injection would be the next step in treatment. The nature of and the indications for a corticosteroid and / or local anaesthetic injection were reviewed in detail with the patient today. The inherent risks of injection including infection, bleeding, allergic reaction, increased pain, incomplete relief or temporary relief of symptoms, alterations of blood glucose levels requiring careful monitoring and treatment as indicated, tendon, ligament or articular cartilage rupture or degeneration, nerve injury, skin depigmentation, and/or fatty atrophy were discussed.     Procedure :  Bilateral Hip Intraarticular injection.  The risks and benefits of the procedure were explained,verbal consent was given, and a procedural time-out was performed. The hip joint and surrounding structures were visualized with ultrasound and the hip joint and anterior recess were identified  The site for the injection was properly marked and prepped with Chlorhexadine solution.  The injection site was anesthetized with ethyl chloride and 4cc of 1% Lidocainewith a 25 gauge 2 inch needle. Using ultrasound guidance, the hip joint was visualized and injected with 20 milligrams of Kenalog, ,and 2 cc of 0.5% Ropivicaine 3cc sterile saline using a sterile technique and a 22 gauge 3.5 inch needle. During the injection, there was unrestricted flow and care was taken not to inject corticosteroid into the skin or subcutaneous tissues. There were no complications during the procedure.    NEED FOR SONOGRAPHIC GUIDANCE    Given the complexity of this problem, the anatomic location of this structure, sonographic guidance is recommended to prevent injury to neurovascular structures and confirm accuracy of injection. The accuracy of doing these injections blind is poor and the benefit to the patient by using ultrasound guidance is significant to avoid complications.     Reference:  American Medical Society for Sports Medicine (AMSSM) position statement: interventional musculoskeletal ultrasound in sports medicine.  Morey Hummingbird MM, Adams E, Jeanell Mangan D, Concoff AL, [employer] Sports Med. 2014 Oct 20. pii: [id number]. doi: 10.1136/[id number]    Post procedure a sterile band-aide was applied. Post-injection instructions were given regarding post-procedure care, when to follow up in clinic and what to expect from the procedure. The patient tolerated the injection well and was discharged without complication.              SUBJECTIVE     Chief Complaint:   Chief Complaint   Patient presents with    Right Hip - Injection(s)    Left Hip - Injection(s)       History of Present Illness: 62 y.o. male who presents for ongoing hip pain Nathaniel Gibson is well-known  to [place] for bilateral hip and knee osteoarthritis he is continue to can do well with conservative care he has been working on his weight loss he is finally got his BMI below 40 he continues to increase his activity and managing well his hips are bothering him more right now and he still wants to work on optimizing himself medically before considering any sort of surgical intervention.  He comes today for follow-up evaluation    Past Medical History:   Past Medical History:   Diagnosis Date    Acute gastric ulcer with hemorrhage 03/18/2022    Allergic rhinitis     Anemia 07/23/2017    Diabetes mellitus (CMS-HCC)     Disease of thyroid gland     Effusion of lower leg joint     GERD (gastroesophageal reflux disease)     High cholesterol     HIV (human immunodeficiency virus infection) (CMS-HCC)     Hypertension     Localized, primary osteoarthritis of lower leg     Obesity, unspecified     Organic sleep apnea, unspecified     Pain in joint, multiple sites     Pterygium, unspecified     Smoker     Snores     Unspecified gingival and periodontal disease          OBJECTIVE     Physical Exam:  Vitals:   Wt Readings from Last 3 Encounters:   04/24/23 (!) 128.2 kg (282 lb 9.6 oz)   04/09/23 (!) 130.9 kg (288 lb 9.6 oz)   03/13/23 (!) 128.9 kg (284 lb 3.2 oz)     Estimated body mass index is 36.28 kg/m?? as calculated from the following:    Height as of 04/24/23: 188 cm (6' 2).    Weight as of 04/24/23: 128.2 kg (282 lb 9.6 oz).  Gen: Well-appearing male in no acute distress  MSK: Bilateral hips-  No visible abnormalities.  No tenderness to palpation.  Continued significantly reduced range of motion.  Pain with logroll  Normal strength.  Positive FADIR's provocative maneuvers.  Neurovascularly intact.    Imaging/other tests: Prior x-rays reviewed showing moderate bilateral hip osteoarthritis      ADMINISTRATIVE     I have personally reviewed and interpreted the images (as available).  I have personally reviewed prior records and incorporated relevant information above (as available).    MEDICAL DECISION MAKING (level of service defined by 2/3 elements)     Number/Complexity of Problems Addressed 1 or more chronic illnesses with exacerbation, progression, or side effects of treatment (99204/99214)   Amount/Complexity of Data to be Reviewed/Analyzed 2 points: Review prior notes (1 point per unique source); Review test results (1 point per unique test); Order tests (1 point per unique test) (99203/99213)   Risk of Complications/Morbidity/Mortality of Management Decision for MINOR Surgery (Including Injection) WITHOUT Risk Factors (99203/99213)     TIME     Total Time for E/M Services on the Date of Encounter Time-based coding not utilized for this encounter     CONSULTATION     Consultation services provided No     MODIFIER -25     Significant, Separately Billable Evaluation and Management YES - The patient's condition required a significant, separately identifiable, medical necessary evaluation and management service by the provider in addition to the procedure performed on the same date of service. The evaluation and management service was above and beyond the usual preoperative and postoperative care associated with the procedure.  PROCEDURES     Lg Joint Inj: bilateral hip joint on 05/02/2023 8:30 AM  Details: ultrasound-guided  Laterality: bilateral  Location: hip  Medications (Right): 20 mg triamcinolone acetonide 10 mg/mL  Medications (Left): 20 mg triamcinolone acetonide 10 mg/mL    Medical Care Team Attestation: All ProcDoc orders were read back and verbally confirmed with the procedure provider, including but not limited to patient name, medication name, dose, and route, before any actions were taken.  Provider Attestation: The information documented by members of my medical care team was reviewed and verified for accuracy by me.           DME     DME ORDER:  Dx:  ,

## 2023-05-12 NOTE — Unmapped (Unsigned)
Assessment and Plan:     Acute maxillary sinusitis, recurrence not specified  -     doxycycline hyclate; Take 1 capsule (100 mg total) by mouth two (2) times a day.    Cough, unspecified type  -     doxycycline hyclate; Take 1 capsule (100 mg total) by mouth two (2) times a day.      Multiple diagnoses above reviewed with patient  Weight trends reviewed with patient. Congratulated Nathaniel Gibson on 10 lb weight reduction since last OV; continue this.  Blood pressure overall at goal at triage; will continue to monitor.   History and exam c/w likely sinusitis. Definition, relevant anatomy, and treatment discussed with patient.   Continue current medication management  Encouraged patient to use his albuterol inhaler up to 2 puffs q6h prn.  Rx as per orders; reviewed risks/benefits, possible side effects and patient verbalizes understanding  If cough does not improve over the next 2 weeks, patient will notify me via MyChart and I will order a CXR for further evaluation.  Follow up in 3-4 months or sooner prn      Barriers to recommended plan: {barrierstocare:74100}    I provided an intervention for the {SDOH UJWJXB:14782} SDOH domain. The intervention was {SDOH INTERVENTION:69735}       I personally spent *** minutes face-to-face and non-face-to-face in the care of this patient, which includes all pre, intra, and post visit time (reviewing, evaluating and discussing pertinent records for patient's care) on the date of service.     Return in about 4 months (around 09/13/2023) for 40 minute follow up CDM.    Subjective:     HPI: Nathaniel Gibson is a 63 y.o. male here for follow up CDM. Last seen by me 04/09/23 for preoperative clearance. Patient with h/o multiple chronic conditions.    DM2 / weight  Managing with Ozempic 2mg  weekly injection and metformin 500mg  daily. Tolerating meds well. Patient continues to work on weight loss. Since last visit he has lost 10 lbs.     Wt Readings from Last 3 Encounters:   05/16/23 (!) 126.1 kg (278 lb)   04/24/23 (!) 128.2 kg (282 lb 9.6 oz)   04/09/23 (!) 130.9 kg (288 lb 9.6 oz)     Cough  Seen at last visit with me 04/09/23 for this. Patient given Rx for [employer]. He is still using Flonase nasal spray and albuterol inhaler once daily. States his other cold symptoms have resolved but is experiencing persistent productive cough. Also endorses sinus congestion. Denies fevers, SOB, or headaches.     Hypertension  Patient with history of hypertension manages with clonidine HCL 0.1mg  nightly and lisinopril 20mg  daily. Blood pressure at triage today 126/87. Denies CP, SOB, L/D or peripheral edema.    BP Readings from Last 3 Encounters:   05/16/23 126/87   04/24/23 124/83   04/09/23 130/82         HPI       ROS:   Review of Systems     Review of systems negative unless otherwise noted as per HPI.      The following portions of the patient's history were reviewed and updated as appropriate: allergies, current medications, past family history, past medical history, past social history, past surgical history and problem list.     Objective:     Vitals:    05/16/23 0823   BP: 126/87   Pulse: 73     Body mass index is 35.69 kg/m??.  Physical Exam  Vitals and nursing note reviewed.   Constitutional:       General: He is not in acute distress.     Appearance: Normal appearance. He is not ill-appearing, toxic-appearing or diaphoretic.   HENT:      Head: Normocephalic and atraumatic.      Right Ear: Ear canal and external ear normal. A middle ear effusion is present.      Left Ear: Tympanic membrane, ear canal and external ear normal.      Mouth/Throat:      Mouth: Mucous membranes are moist.      Pharynx: Oropharynx is clear. Postnasal drip present.   Cardiovascular:      Rate and Rhythm: Normal rate and regular rhythm.      Heart sounds: Normal heart sounds.   Pulmonary:      Effort: Pulmonary effort is normal. No respiratory distress.      Breath sounds: Wheezing present.   Musculoskeletal:      Cervical back: Neck supple.   Neurological:      Mental Status: He is alert. Mental status is at baseline.          Allergies:     Naprosyn [naproxen]    [facility]:     Medication adherence and barriers to the treatment plan have been addressed. Opportunities to optimize healthy behaviors have been discussed. Patient / caregiver voiced understanding.      I attest that I, [name], personally documented this note while acting as scribe for [name], MD.      [name], Scribe.  05/16/2023     The documentation recorded by the scribe accurately reflects the service I personally performed and the decisions made by me.    [name], MD

## 2023-05-16 ENCOUNTER — Ambulatory Visit: Admit: 2023-05-16 | Discharge: 2023-05-17 | Payer: MEDICARE

## 2023-05-16 DIAGNOSIS — J0100 Acute maxillary sinusitis, unspecified: Principal | ICD-10-CM

## 2023-05-16 DIAGNOSIS — R059 Cough, unspecified type: Principal | ICD-10-CM

## 2023-05-16 MED ORDER — DOXYCYCLINE HYCLATE 100 MG CAPSULE
ORAL_CAPSULE | Freq: Two times a day (BID) | ORAL | 0 refills | 10.00 days | Status: CP
Start: 2023-05-16 — End: ?

## 2023-06-02 DIAGNOSIS — J449 Chronic obstructive pulmonary disease, unspecified: Principal | ICD-10-CM

## 2023-06-02 DIAGNOSIS — G8929 Other chronic pain: Principal | ICD-10-CM

## 2023-06-02 DIAGNOSIS — M25551 Pain in right hip: Principal | ICD-10-CM

## 2023-06-02 MED ORDER — TRAMADOL 50 MG TABLET
ORAL_TABLET | Freq: Two times a day (BID) | ORAL | 0 refills | 15.00 days | PRN
Start: 2023-06-02 — End: 2023-07-01

## 2023-06-02 MED ORDER — BIKTARVY 50 MG-200 MG-25 MG TABLET
ORAL_TABLET | 1 refills | 0.00 days
Start: 2023-06-02 — End: ?

## 2023-06-02 MED ORDER — ALBUTEROL SULFATE HFA 90 MCG/ACTUATION AEROSOL INHALER
RESPIRATORY_TRACT | 2 refills | 34.00 days | PRN
Start: 2023-06-02 — End: ?

## 2023-06-02 NOTE — Unmapped (Signed)
[facility] Delivery Pharm[facility] Refill Coordination Note    Specialty Medication(s) to be Shipped:   Infectious Disease: Biktarvy    Other medication(s) to be shipped:  lidocaine patches, albuterol inhaler, tramadol, cyclobenzaprine     Nathaniel Gibson, DOB: [birth year]  Phone: There are no phone numbers on file.      All above HIPAA information was verified with patient.     Was a Nurse, learning disability used for this call? No    Completed refill call assessment today to schedule patient's medication shipment from the [facility] and [facility] Delivery Pharmacy  [phone]).  All relevant notes have been reviewed.     Specialty medication(s) and dose(s) confirmed: Regimen is correct and unchanged.   Changes to medications: Nathaniel Gibson reports no changes at this time.  Changes to insurance: No  New side effects reported not previously addressed with a pharmacist or physician: None reported  Questions for the pharmacist: No    Confirmed patient received a Conservation officer, historic buildings and a Surveyor, mining with first shipment. The patient will receive a drug information handout for each medication shipped and additional FDA Medication Guides as required.       DISEASE/MEDICATION-SPECIFIC INFORMATION        N/A    SPECIALTY MEDICATION ADHERENCE     Medication Adherence    Patient reported X missed doses in the last month: 0  Specialty Medication: BIKTARVY 50-200-25 mg tablet (bictegrav-emtricit-tenofov ala)  Patient is on additional specialty medications: No  Patient is on more than two specialty medications: No  Any gaps in refill history greater than 2 weeks in the last 3 months: no  Demonstrates understanding of importance of adherence: yes  Informant: patient  Confirmed plan for next specialty medication refill: delivery by pharmacy  Refills needed for supportive medications: yes, ordered or provider notified          Refill Coordination    Has the Patients' Contact Information Changed: No  Is the Shipping Address Different: No         Were doses missed due to medication being on hold? No    BIKTARVY 50-200-25   mg: 8 days of medicine on hand       REFERRAL TO PHARMACIST     Referral to the pharmacist: Not needed      [facility]     Shipping address confirmed in Epic.       Delivery Scheduled: Yes, Expected medication delivery date: 06/06/23.  However, Rx request for refills was sent to the provider as there are none remaining.     Medication will be delivered via Next Day Courier to the prescription address in Epic WAM.    Nathaniel Gibson   [facility] Delivery Pharm[facility]  Specialty Technician

## 2023-06-03 MED ORDER — ALBUTEROL SULFATE HFA 90 MCG/ACTUATION AEROSOL INHALER
RESPIRATORY_TRACT | 2 refills | 34.00 days | Status: CP | PRN
Start: 2023-06-03 — End: ?
  Filled 2023-06-05: qty 17, 34d supply, fill #0

## 2023-06-03 MED ORDER — TRAMADOL 50 MG TABLET
ORAL_TABLET | Freq: Two times a day (BID) | ORAL | 0 refills | 15.00 days | Status: CP | PRN
Start: 2023-06-03 — End: 2023-07-02
  Filled 2023-06-05: qty 60, 15d supply, fill #0

## 2023-06-03 MED ORDER — BIKTARVY 50 MG-200 MG-25 MG TABLET
ORAL_TABLET | 5 refills | 0.00 days | Status: CP
Start: 2023-06-03 — End: ?
  Filled 2023-06-05: qty 30, 30d supply, fill #0

## 2023-06-03 NOTE — Unmapped (Signed)
Medication Requested: biktarvy.       Future Appointments   Date Time Provider Department Center   09/18/2023  8:00 AM [name], MD [id number]   10/29/2023  1:00 PM Leveda Anna, MD [id number]     Per Provider Note: no issues taking biktarvy.     Standing order protocol requirements met?: Yes    Sent to: Pharmacy    Days Supply Given: 30 days  Number of Refills: 5

## 2023-06-05 MED FILL — CYCLOBENZAPRINE 10 MG TABLET: ORAL | 20 days supply | Qty: 60 | Fill #1

## 2023-06-05 MED FILL — LIDOCAINE 5 % TOPICAL PATCH: TRANSDERMAL | 30 days supply | Qty: 30 | Fill #1

## 2023-06-20 MED ORDER — ACETAMINOPHEN 500 MG TABLET
ORAL_TABLET | Freq: Three times a day (TID) | ORAL | 1 refills | 14.00 days | PRN
Start: 2023-06-20 — End: ?

## 2023-06-20 MED ORDER — CHLORHEXIDINE GLUCONATE 0.12 % MOUTHWASH
0 refills | 0.00 days
Start: 2023-06-20 — End: ?

## 2023-06-20 MED ORDER — DEXAMETHASONE 4 MG TABLET
ORAL_TABLET | 1 refills | 2.00 days
Start: 2023-06-20 — End: ?

## 2023-06-20 MED ORDER — OXYCODONE 5 MG TABLET
ORAL_TABLET | 0 refills | 0.00 days
Start: 2023-06-20 — End: ?

## 2023-06-20 MED ORDER — AMOXICILLIN 500 MG CAPSULE
ORAL_CAPSULE | Freq: Three times a day (TID) | ORAL | 1 refills | 5.00 days
Start: 2023-06-20 — End: ?

## 2023-06-20 MED ORDER — IBUPROFEN 800 MG TABLET
ORAL_TABLET | Freq: Three times a day (TID) | 1 refills | 10.00 days | PRN
Start: 2023-06-20 — End: ?

## 2023-06-20 MED FILL — CHLORHEXIDINE GLUCONATE 0.12 % MOUTHWASH: 16 days supply | Qty: 473 | Fill #0

## 2023-06-20 MED FILL — AMOXICILLIN 500 MG CAPSULE: ORAL | 5 days supply | Qty: 15 | Fill #0

## 2023-06-20 MED FILL — ACETAMINOPHEN 500 MG TABLET: ORAL | 14 days supply | Qty: 40 | Fill #0

## 2023-06-20 MED FILL — IBUPROFEN 800 MG TABLET: 10 days supply | Qty: 30 | Fill #0

## 2023-06-20 MED FILL — DEXAMETHASONE 4 MG TABLET: 2 days supply | Qty: 2 | Fill #0

## 2023-06-20 MED FILL — OXYCODONE 5 MG TABLET: 1 days supply | Qty: 5 | Fill #0

## 2023-06-24 DIAGNOSIS — M25551 Pain in right hip: Principal | ICD-10-CM

## 2023-06-24 DIAGNOSIS — G8929 Other chronic pain: Principal | ICD-10-CM

## 2023-06-24 MED ORDER — TRAMADOL 50 MG TABLET
ORAL_TABLET | Freq: Two times a day (BID) | ORAL | 0 refills | 15.00 days | Status: CP | PRN
Start: 2023-06-24 — End: 2023-07-23
  Filled 2023-06-27: qty 60, 15d supply, fill #0

## 2023-06-24 NOTE — Unmapped (Signed)
 [facility] and Home Delivery Pharm[facility] Refill Coordination Note    Specialty Medication(s) to be Shipped:   Infectious Disease: Biktarvy    Other medication(s) to be shipped: No additional medications requested for fill at this time     Nathaniel Gibson, DOB: 08-12-1960  Phone: There are no phone numbers on file.      All above HIPAA information was verified with patient.     Was a Nurse, learning disability used for this call? No    Completed refill call assessment today to schedule patient's medication shipment from the [facility] and [facility] Delivery Pharmacy  [phone]).  All relevant notes have been reviewed.     Specialty medication(s) and dose(s) confirmed: Regimen is correct and unchanged.   Changes to medications: Nathaniel Gibson reports no changes at this time.  Changes to insurance: No  New side effects reported not previously addressed with a pharmacist or physician: None reported  Questions for the pharmacist: No    Confirmed patient received a Conservation officer, historic buildings and a Surveyor, mining with first shipment. The patient will receive a drug information handout for each medication shipped and additional FDA Medication Guides as required.       DISEASE/MEDICATION-SPECIFIC INFORMATION        N/A    SPECIALTY MEDICATION ADHERENCE     Medication Adherence    Patient reported X missed doses in the last month: 0  Specialty Medication: bictegrav-emtricit-tenofov ala (BIKTARVY) 50-200-25 mg tablet  Patient is on additional specialty medications: No  Informant: patient                Were doses missed due to medication being on hold? No    BIKTARVY 50-200-25   mg:  unsure days of medicine on hand       REFERRAL TO PHARMACIST     Referral to the pharmacist: Not needed      [facility]     Shipping address confirmed in Epic.       Delivery Scheduled: Yes, Expected medication delivery date: 2/25.     Medication will be delivered via Next Day Courier to the prescription address in Epic WAM.    Nathaniel Gibson [facility] and [facility]

## 2023-06-25 NOTE — Unmapped (Signed)
 RD called pt to re-engage in nutritional counseling for weight management, glucose management, food insecurity. RD left VM for pt to call back clinic at earliest convenience.     Unk Pinto, MS, RD, LDN

## 2023-06-27 MED FILL — CLONIDINE HCL 0.1 MG TABLET: ORAL | 90 days supply | Qty: 90 | Fill #1

## 2023-06-27 MED FILL — CYCLOBENZAPRINE 10 MG TABLET: ORAL | 20 days supply | Qty: 60 | Fill #2

## 2023-06-27 MED FILL — ATORVASTATIN 40 MG TABLET: 90 days supply | Qty: 90 | Fill #1

## 2023-06-27 MED FILL — LISINOPRIL 20 MG TABLET: 90 days supply | Qty: 90 | Fill #1

## 2023-06-27 MED FILL — FLUTICASONE PROPIONATE 50 MCG/ACTUATION NASAL SPRAY,SUSPENSION: NASAL | 90 days supply | Qty: 48 | Fill #2

## 2023-06-30 MED FILL — BIKTARVY 50 MG-200 MG-25 MG TABLET: 30 days supply | Qty: 30 | Fill #1

## 2023-07-14 ENCOUNTER — Emergency Department: Admit: 2023-07-14 | Discharge: 2023-07-14 | Disposition: A | Discharge status: Home / Self Care | Payer: MEDICARE

## 2023-07-14 DIAGNOSIS — R059 Cough, unspecified type: Principal | ICD-10-CM

## 2023-07-14 MED ORDER — PREDNISONE 20 MG TABLET
ORAL_TABLET | Freq: Every day | ORAL | 0 refills | 5.00 days | Status: CP
Start: 2023-07-14 — End: 2023-07-19

## 2023-07-14 MED ORDER — DOXYCYCLINE HYCLATE 100 MG CAPSULE
ORAL_CAPSULE | Freq: Two times a day (BID) | ORAL | 0 refills | 7.00 days | Status: CP
Start: 2023-07-14 — End: 2023-07-21
  Filled 2023-07-18: qty 14, 7d supply, fill #0

## 2023-07-14 MED ADMIN — predniSONE (DELTASONE) tablet 60 mg: 60 mg | ORAL | @ 17:00:00 | Stop: 2023-07-14

## 2023-07-14 MED ADMIN — ipratropium-albuterol (DUO-NEB) 0.5-2.5 mg/3 mL nebulizer solution 3 mL: 3 mL | RESPIRATORY_TRACT | @ 17:00:00 | Stop: 2023-07-14

## 2023-07-14 MED ADMIN — doxycycline (VIBRA-TABS) tablet 100 mg: 100 mg | ORAL | @ 18:00:00 | Stop: 2023-07-14

## 2023-07-14 NOTE — Unmapped (Signed)
 Pt reports having a cough for over 2 months. Sts the cough is productive.

## 2023-07-14 NOTE — Unmapped (Signed)
 [facility]  Emergency Department Provider Note      ED Clinical Impression      Final diagnoses:   Cough, unspecified type (Primary)            Impression, Medical Decision Making, Progress Notes and Critical Care      Impression, Differential Diagnosis and Plan of Care    Nathaniel Gibson is a 63 y.o. male PMH HIV on Biktarvy, COPD, allergic rhinitis, GERD, hypertension, hyperlipidemia presenting for cough ongoing for over 2 months.     On exam, very pleasant and in no acute distress.  Very mild end expiratory wheezing.  Chest x-ray obtained in triage which does not demonstrate any pneumonia.  Symptoms at this time most consistent with COPD exacerbation, patient is otherwise well-appearing, afebrile, compliant with his HIV medications, no chest pain or shortness of breath.  Will administer breathing treatment and reassess.    Patient did report feeling better after breathing treatment and reports feeling ready for discharge at this time.  Discussed supportive care, will prescribe doxycycline and prednisone for possible COPD exacerbation.  Do recommend that he follows up with his PCP and pulmonologist in the next 1 to 2 days to discuss this hospital visit.  Strict return precautions given.  He verbalized understanding and agreement with the plan of care.        Additional Progress Notes           Portions of this record have been created using Scientist, clinical (histocompatibility and immunogenetics). Dictation errors have been sought, but may not have been identified and corrected.    See chart and resident provider documentation for details.    ____________________________________________         History        Reason for Visit  Cough      HPI   Nathaniel Gibson is a 63 y.o. male PMH HIV on Biktarvy, COPD, allergic rhinitis, GERD, hypertension, hyperlipidemia presenting for cough ongoing for over 2 months.  Patient reports that has become more productive over the past few weeks.  Using Mucinex which does help his symptoms. Denies known sick contacts.  Denies fever, chest pain, shortness of breath, nausea, vomiting, abdominal pain, any other symptoms or concerns at this time.    Outside Historian(s)  (EMS, Significant Other, Family, Parent, Caregiver, Friend, Law Enforcement, etc.)        External Records Reviewed  (Inpatient/Outpatient notes, Prior labs/imaging studies, Care Everywhere, PDMP, External ED notes, etc)        Past Medical History:   Diagnosis Date    Acute gastric ulcer with hemorrhage 03/18/2022    Allergic rhinitis     Anemia 07/23/2017    Diabetes mellitus (CMS-HCC)     Disease of thyroid gland     Effusion of lower leg joint     GERD (gastroesophageal reflux disease)     High cholesterol     HIV (human immunodeficiency virus infection) (CMS-HCC)     Hypertension     Localized, primary osteoarthritis of lower leg     Obesity, unspecified     Organic sleep apnea, unspecified     Pain in joint, multiple sites     Pterygium, unspecified     Smoker     Snores     Unspecified gingival and periodontal disease        Patient Active Problem List   Diagnosis    Obesity    Primary localized osteoarthrosis, lower leg  Tobacco dependence    Right foot pain    Hyperthyroidism    Bone spur    Perirectal cyst    Pterygium eye    Diabetes mellitus type 2 in obese    Hypertension    Mixed hyperlipidemia    HIV (human immunodeficiency virus infection) (CMS-HCC)    MDD (major depressive disorder), recurrent episode, moderate (CMS-HCC)    Anal wart    Knee pain    Intestinal metaplasia of gastric mucosa    Allergic rhinitis    Right hip pain    Annual physical exam    Depression    Rectal polyp    Impacted cerumen    COPD (chronic obstructive pulmonary disease) (CMS-HCC)    Gastroesophageal reflux disease    Other chronic pain    Cervical radiculopathy    Difficulty demonstrating health literacy    Cholesteatoma of right ear    Left upper arm pain    Health care maintenance    Decreased appetite    History of colonic polyps    History of hyperthyroidism    Personal history of nicotine dependence       Past Surgical History:   Procedure Laterality Date    NO PAST SURGERIES      PR COLONOSCOPY W/BIOPSY SINGLE/MULTIPLE Left 12/06/2014    Procedure: COLONOSCOPY, FLEXIBLE, PROXIMAL TO SPLENIC FLEXURE; WITH BIOPSY, SINGLE OR MULTIPLE;  Surgeon: Carmon Ginsberg, MD;  Location: [facility];  Service: Gastroenterology    PR COLSC FLEXIBLE W/CONTROL BLEEDING ANY METHOD Left 12/06/2014    Procedure: COLONOSCOPY, FLEXIBLE, PROXIMAL TO SPLENIC FLEXURE; DX, W/CONTROL OF BLEEDING;  Surgeon: Carmon Ginsberg, MD;  Location: [facility];  Service: Gastroenterology    PR COLSC FLX W/RMVL OF TUMOR POLYP LESION SNARE TQ Left 12/06/2014    Procedure: COLONOSCOPY FLEX; W/REMOV TUMOR/LES BY SNARE;  Surgeon: Carmon Ginsberg, MD;  Location: [facility];  Service: Gastroenterology    PR COLSC FLX WITH DIRECTED SUBMUCOSAL NJX ANY SBST Left 12/06/2014    Procedure: COLONOSCOPY, FLEXIBLE, PROXIMAL TO SPLENIC FLEXURE; WITH DIRECTED SUBMUCOSAL INJECTION(S), ANY SUBSTANCE;  Surgeon: Carmon Ginsberg, MD;  Location: [facility];  Service: Gastroenterology    PR EAR CARTILAGE GRAFT TO FACE Right 05/24/2021    Procedure: GRAFT; EAR CARTILAGE, AUTOGENOUS, TO NOSE OR EAR (INCLUDES OBTAINING GRAFT);  Surgeon: Despina Hick, MD;  Location: MAIN OR [facility];  Service: ENT    PR GRAFTING OF AUTOLOGOUS SOFT TISS BY DIRECT EXC Right 05/24/2021    Procedure: GRAFTING OF AUTOLOGOUS SOFT TISSUE, OTHER, HARVESTED BY DIRECT EXCISION (EG, FAT, DERMIS, FASCIA);  Surgeon: Despina Hick, MD;  Location: MAIN OR [facility];  Service: ENT    PR MICROSURG TECHNIQUES,REQ OPER MICROSCOPE N/A 05/24/2021    Procedure: MICROSURGICAL TECHNIQUES, REQUIRING USE OF OPERATING MICROSCOPE (LIST SEPARATELY IN ADDITION TO CODE FOR PRIMARY PROCEDURE);  Surgeon: Despina Hick, MD;  Location: MAIN OR [facility];  Service: ENT    PR TYMPANOPLAS/MASTOIDEC,RAD,REBLD OSSI Right 05/24/2021    Procedure: Abbie Sons; RAD Arlyn Dunning;  Surgeon: Despina Hick, MD;  Location: MAIN OR [facility];  Service: ENT         Current Facility-Administered Medications:     doxycycline (VIBRA-TABS) tablet 100 mg, 100 mg, Oral, Once, Henreitta Leber, PA    triamcinolone acetonide (KENALOG-40) injection 20 mg, 20 mg, Intra-articular, Once, Basilio Cairo, MD    triamcinolone acetonide (KENALOG-40) injection 20 mg, 20 mg, Intra-articular, Once, Berkoff,  Matt Holmes, MD    Current Outpatient Medications:     acetaminophen (TYLENOL 8 HOUR) 650 MG CR tablet, Take 1 tablet (650 mg total) by mouth every eight (8) hours as needed for pain., Disp: , Rfl:     acetaminophen (TYLENOL) 500 MG tablet, Take one tablet every 8 hours for post-surgical discomfort, Disp: 40 tablet, Rfl: 1    albuterol HFA 90 mcg/actuation inhaler, Inhale 2 puffs by mouth every four (4) hours as needed for wheezing., Disp: 17 g, Rfl: 2    amoxicillin (AMOXIL) 500 MG capsule, Take one capsules three times daily for 5 days, Disp: 15 capsule, Rfl: 1    atorvastatin (LIPITOR) 40 MG tablet, Take 1 tablet (40 mg) by mouth daily., Disp: 90 tablet, Rfl: 4    bictegrav-emtricit-tenofov ala (BIKTARVY) 50-200-25 mg tablet, Take 1 tablet by mouth daily., Disp: 30 tablet, Rfl: 5    BIKTARVY 50-200-25 mg tablet, TAKE 1 TABLET BY MOUTH DAILY, Disp: 30 tablet, Rfl: 1    blood sugar diagnostic (ON CALL EXPRESS TEST STRIP) Strp, Check blood sugar as directed once a day and for symptoms of high or low blood sugar., Disp: 200 each, Rfl: 4    blood-glucose meter (ON CALL EXPRESS METER) Misc, USE AS DIRECTED TO TEST BLOOD SUGAR, Disp: 1 each, Rfl: 0    blood-glucose meter kit, Use as instructed, Disp: 1 each, Rfl: 0    chlorhexidine (PERIDEX) 0.12 % solution, Swish and spit 15ml for at least 1 minute two times per day, Disp: 473 mL, Rfl: 0    cloNIDine HCL (CATAPRES) 0.1 MG tablet, Take 1 tablet (0.1 mg total) by mouth nightly., Disp: 90 tablet, Rfl: 3    cyclobenzaprine (FLEXERIL) 10 MG tablet, Take 1 tablet (10 mg total) by mouth Three (3) times a day as needed., Disp: 60 tablet, Rfl: 2    dexAMETHasone (DECADRON) 4 MG tablet, Take one tablet every 24 hours for the first two days after surgery, Disp: 2 tablet, Rfl: 1    doxycycline (VIBRAMYCIN) 100 MG capsule, Take 1 capsule (100 mg total) by mouth two (2) times a day., Disp: 20 capsule, Rfl: 0    doxycycline (VIBRAMYCIN) 100 MG capsule, Take 1 capsule (100 mg total) by mouth two (2) times a day for 7 days., Disp: 14 capsule, Rfl: 0    fluticasone propionate (FLONASE) 50 mcg/actuation nasal spray, Use 2 sprays in each nostril daily., Disp: 16 g, Rfl: 11    ibuprofen (MOTRIN) 800 MG tablet, Take one tablet every 8 hours for post-surgical discomfort. Take in-between intake of Tyelenol., Disp: 30 tablet, Rfl: 1    lancing device (ON CALL LANCING DEVICE) Misc, USE AS DIRECTED TO CHECK YOUR BLOOD SUGAR, Disp: 1 each, Rfl: 4    lansoprazole (PREVACID) 30 MG capsule, Take 1 capsule (30 mg total) by mouth daily., Disp: 90 capsule, Rfl: 3    lidocaine (LIDODERM) 5 % patch, Place 1 patch on the affected area for 12 hours only each day (then remove patch) as directed, Disp: 30 patch, Rfl: 2    lidocaine (LIDODERM) 5 % patch, Apply 1 patch externally to the affected area for 12 hours only each day, then remove patch as directed, Disp: 30 patch, Rfl: 2    lisinopril (PRINIVIL,ZESTRIL) 20 MG tablet, Take 1 tablet (20 mg) by mouth daily., Disp: 90 tablet, Rfl: 3    metFORMIN (GLUCOPHAGE) 500 MG tablet, Take 1 tablet (500 mg) by mouth in the morning and in the evening with meals.,  Disp: 180 tablet, Rfl: 3    multivitamin (THERAGRAN) per tablet, Take 1 tablet by mouth daily., Disp: , Rfl:     nicotine polacrilex (NICORETTE MINI) lozenge 4 MG, Apply 1 lozenge (4 mg total) to cheek every four (4) hours as needed for smoking cessation., Disp: 72 each, Rfl: 2    oxyCODONE (ROXICODONE) 5 MG immediate release tablet, Take one tablet every 6-8 hours as needed for pain after oral surgery., Disp: 5 tablet, Rfl: 0    polyethylene glycol (GOLYTELY) 236-22.74-6.74 gram solution, Take by mouth as directed per [facility] GI prep instructions, for split bowel prep., Disp: 4000 mL, Rfl: 0    predniSONE (DELTASONE) 20 MG tablet, Take 2 tablets (40 mg total) by mouth daily for 5 days., Disp: 10 tablet, Rfl: 0    QUEtiapine (SEROQUEL) 25 MG tablet, Take 1 tablet (25 mg total) by mouth nightly., Disp: , Rfl:     semaglutide (OZEMPIC) 2 mg/dose (8 mg/3 mL) PnIj, Inject 2 mg under the skin every seven (7) days., Disp: 9 mL, Rfl: 3    semaglutide (OZEMPIC) 2 mg/dose (8 mg/3 mL) PnIj, Inject 2mg  under the skin one day a week., Disp: 9 mL, Rfl: 3    sertraline (ZOLOFT) 100 MG tablet, Take 2 tablets (200 mg total) by mouth daily., Disp: 180 tablet, Rfl: 3    traMADol (ULTRAM) 50 mg tablet, Take 1-2 tablets (50-100 mg total) by mouth two (2) times a day as needed., Disp: 60 tablet, Rfl: 0    Allergies  Naprosyn [naproxen]    Family History   Problem Relation Age of Onset    Diabetes Mother     Hypertension Mother     Kidney disease Mother     Cancer Mother         Breast    Diabetes Father     Heart disease Father         CHF    No Known Problems Brother     No Known Problems Sister     No Known Problems Maternal Aunt     No Known Problems Maternal Uncle     No Known Problems Paternal Aunt     No Known Problems Paternal Uncle     No Known Problems Maternal Grandmother     No Known Problems Maternal Grandfather     No Known Problems Paternal Grandmother     No Known Problems Paternal Grandfather     No Known Problems Other     Alcohol abuse Neg Hx     Depression Neg Hx     Drug abuse Neg Hx     Mental illness Neg Hx     Stroke Neg Hx     Anesthesia problems Neg Hx     Broken bones Neg Hx     Clotting disorder Neg Hx     Collagen disease Neg Hx     Dislocations Neg Hx     Fibromyalgia Neg Hx     Gout Neg Hx Hemophilia Neg Hx     Osteoporosis Neg Hx     Rheumatologic disease Neg Hx     Scoliosis Neg Hx     Severe sprains Neg Hx     Sickle cell anemia Neg Hx     Spinal Compression Fracture Neg Hx        Social History  Social History     Tobacco Use    Smoking status: Every Day     Current packs/day: 1.00  Average packs/day: 1 pack/day for 49.2 years (49.2 ttl pk-yrs)     Types: Cigarettes     Start date: 1976    Smokeless tobacco: Never    Tobacco comments:     1 pack lasts 3 days    Vaping Use    Vaping status: Never Used   Substance Use Topics    Alcohol use: No     Alcohol/week: 0.0 standard drinks of alcohol    Drug use: No          Physical Exam       ED Triage Vitals [07/14/23 1021]   Enc Vitals Group      BP 156/100      Heart Rate 91      SpO2 Pulse       Resp 18      Temp 36.7 ??C (98.1 ??F)      Temp Source Oral      SpO2 98 %      Weight (!) 122.9 kg (271 lb)      Height       Head Circumference       Peak Flow       Pain Score       Pain Loc       Pain Education       Exclude from Growth Chart        Constitutional: Alert and oriented. Well appearing and in no distress.  Eyes: Conjunctivae are normal.  ENT       Head: Normocephalic and atraumatic.       Nose: No congestion.       Mouth/Throat: Mucous membranes are moist.       Neck: No stridor.  Hematological/Lymphatic/Immunilogical: No cervical lymphadenopathy.  Cardiovascular: Normal rate, regular rhythm. Normal and symmetric distal pulses are present in all extremities.  Respiratory: Normal respiratory effort. Breath sounds are normal.  Gastrointestinal: Soft and nontender. There is no CVA tenderness.  Musculoskeletal: Normal range of motion in all extremities.       Right lower leg: No tenderness or edema.       Left lower leg: No tenderness or edema.  Neurologic: Normal speech and language. No gross focal neurologic deficits are appreciated.  Skin: Skin is warm, dry and intact. No rash noted.  Psychiatric: Mood and affect are normal. Speech and behavior are normal.       Radiology     XR Chest 2 views   Final Result      No acute cardiopulmonary abnormalities.             Labs     Results for orders placed or performed in visit on 04/24/23   HIV RNA, Quantitative, PCR   Result Value Ref Range    HIV RNA Quant Result Detected (A) Not Detected    HIV RNA <20 (H) <0 copies/mL    HIV RNA Log10     Syphilis Screen   Result Value Ref Range    RPR Nonreactive Nonreactive   Rubeola antibody IgG   Result Value Ref Range    Rubeola IgG Positive Positive   Mumps antibody, IgG   Result Value Ref Range    Mumps IgG Positive Positive   Rubella Antibody, IgG   Result Value Ref Range    Rubella IgG Scr Positive    Comprehensive Metabolic Panel   Result Value Ref Range    Sodium 141 135 - 145 mmol/L    Potassium 3.6 3.4 - 4.8  mmol/L    Chloride 103 98 - 107 mmol/L    CO2 27.8 20.0 - 31.0 mmol/L    Anion Gap 10 5 - 14 mmol/L    BUN 11 9 - 23 mg/dL    Creatinine 8.11 9.14 - 1.18 mg/dL    BUN/Creatinine Ratio 12     eGFR CKD-EPI (2021) Male 90 >=60 mL/min/1.40m2    Glucose 86 70 - 179 mg/dL    Calcium 9.7 8.7 - 78.2 mg/dL    Albumin 3.7 3.4 - 5.0 g/dL    Total Protein 7.7 5.7 - 8.2 g/dL    Total Bilirubin 0.4 0.3 - 1.2 mg/dL    AST 14 <=95 U/L    ALT 13 10 - 49 U/L    Alkaline Phosphatase 96 46 - 116 U/L   LYMPH MARKER LIMITED,FLOW   Result Value Ref Range    CD3% (T Cells) 70 61 - 86 %    Absolute CD3 Count 1,960 915 - 3,400 /uL    CD4% (T Helper) 53 34 - 58 %    Absolute CD4 Count 1,484 510 - 2,320 /uL    CD8% T Suppressor 16 12 - 38 %    Absolute CD8 Count 448 180 - 1,520 /uL    CD4:CD8 Ratio 3.3 0.9 - 4.8   CBC w/ Differential   Result Value Ref Range    WBC 6.1 3.6 - 11.2 10*9/L    RBC 4.76 4.26 - 5.60 10*12/L    HGB 14.8 12.9 - 16.5 g/dL    HCT 62.1 30.8 - 65.7 %    MCV 90.7 77.6 - 95.7 fL    MCH 31.2 25.9 - 32.4 pg    MCHC 34.4 32.0 - 36.0 g/dL    RDW 84.6 96.2 - 95.2 %    MPV 8.4 6.8 - 10.7 fL    Platelet 243 150 - 450 10*9/L    Neutrophils % 41.1 %    Lymphocytes % 45.7 %    Monocytes % 10.6 %    Eosinophils % 1.7 %    Basophils % 0.9 %    Absolute Neutrophils 2.5 1.8 - 7.8 10*9/L    Absolute Lymphocytes 2.8 1.1 - 3.6 10*9/L    Absolute Monocytes 0.7 0.3 - 0.8 10*9/L    Absolute Eosinophils 0.1 0.0 - 0.5 10*9/L    Absolute Basophils 0.1 0.0 - 0.1 10*9/L     *Note: Due to a large number of results and/or encounters for the requested time period, some results have not been displayed. A complete set of results can be found in Results Review.       Pertinent labs & imaging results that were available during my care of the patient were reviewed by me and considered in my medical decision making (see chart for details).               Margaret Pyle [place], [place]  07/14/23 1339

## 2023-07-17 ENCOUNTER — Ambulatory Visit: Admit: 2023-07-17 | Discharge: 2023-07-18 | Payer: MEDICARE

## 2023-07-17 DIAGNOSIS — G8929 Other chronic pain: Principal | ICD-10-CM

## 2023-07-17 DIAGNOSIS — J449 Chronic obstructive pulmonary disease, unspecified: Principal | ICD-10-CM

## 2023-07-17 DIAGNOSIS — M25551 Pain in right hip: Principal | ICD-10-CM

## 2023-07-17 MED ORDER — PREDNISONE 10 MG TABLET
ORAL_TABLET | 0 refills | 0.00 days | Status: CP
Start: 2023-07-17 — End: ?
  Filled 2023-07-18: qty 10, 5d supply, fill #0
  Filled 2023-07-18: qty 21, 6d supply, fill #0

## 2023-07-17 MED ORDER — TIOTROPIUM BROMIDE 18 MCG CAPSULE WITH INHALATION DEVICE
ORAL_CAPSULE | Freq: Every day | RESPIRATORY_TRACT | 2 refills | 30.00 days | Status: CP
Start: 2023-07-17 — End: 2023-10-15
  Filled 2023-07-18: qty 30, 30d supply, fill #0

## 2023-07-17 MED ORDER — LIDOCAINE 5 % TOPICAL PATCH
MEDICATED_PATCH | TRANSDERMAL | 2 refills | 30.00 days | Status: CP
Start: 2023-07-17 — End: ?
  Filled 2023-07-18: qty 30, 30d supply, fill #0

## 2023-07-17 MED ORDER — TRAMADOL 50 MG TABLET
ORAL_TABLET | Freq: Two times a day (BID) | ORAL | 0 refills | 15.00 days | Status: CN | PRN
Start: 2023-07-17 — End: 2023-08-15

## 2023-07-17 NOTE — Unmapped (Signed)
 Assessment and Plan:     Chronic obstructive pulmonary disease, unspecified COPD type (CMS-HCC)  -     tiotropium bromide; Place 1 capsule (18 mcg total) into inhaler and inhale daily.    Other chronic pain  -     lidocaine; Place 1 patch on the affected area for 12 hours only each day (then remove patch) as directed    Right hip pain    Other orders  -     predniSONE; Take 6 tabs po once day 1, then decrease by 10mg  (1 tab) every day until finished      Multiple diagnoses above reviewed with patient  ED discharge note from 07/14/23 reviewed and discussed with patient. Relevant imaging and labs discussed.  Continue current medication management. Refills as above.  Rx as per orders; reviewed risks/benefits, possible side effects and patient verbalizes understanding  Rx for Spiriva inhaler sent to patient's pharmacy.   Repeat prednisone taper sent. Instructions given.   Recommend supportive treatment with lifestyle modifications with diet/exercise        Barriers to recommended plan: None identified    I personally spent 30 minutes face-to-face and non-face-to-face in the care of this patient, which includes all pre, intra, and post visit time (reviewing, evaluating and discussing pertinent records for patient's care) on the date of service.     Return for Next scheduled follow up.    Subjective:     HPI: Nathaniel Gibson is a 63 y.o. male here for ED visit follow up.     ED visit 07/14/23  Patient seen in the [facility] ED for cough. CXR did not demonstrate any pneumonia. Patient given breathing treatment in the ED and improved following this. He was discharged home with Rx for doxycycline and prednisone for possible COPD exacerbation.    Doing better today but his cough is still present. Patient has finished his last dose of prednisone but still has a few more days of doxy. Patient was previously using Spiriva but this was discontinued and he is unsure why. He was previously getting this through his pulmonologist but has not seen them since 2023.     Chronic pain  He is requesting refill of tramadol and lidocaine patches. Patient is having to take tramadol 150 mg to alleviate his pain.    Possible sleep apnea  Patient was recommended to have a sleep study done to evaluate for sleep apnea.     HPI       ROS:   Review of Systems     Review of systems negative unless otherwise noted as per HPI.      The following portions of the patient's history were reviewed and updated as appropriate: allergies, current medications, past family history, past medical history, past social history, past surgical history and problem list.     Objective:     Vitals:    07/17/23 1600   BP: 137/85   Pulse: 101   Temp: 37.1 ??C (98.8 ??F)   SpO2: 97%     Body mass index is 35.38 kg/m??.    Physical Exam  Vitals and nursing note reviewed.   Constitutional:       General: He is not in acute distress.     Appearance: Normal appearance. He is not ill-appearing, toxic-appearing or diaphoretic.   Cardiovascular:      Rate and Rhythm: Normal rate and regular rhythm.      Heart sounds: Normal heart sounds.   Pulmonary:  Effort: Pulmonary effort is normal. No respiratory distress.      Breath sounds: Wheezing (mild bilaterally) and rhonchi (diffuse) present.   Musculoskeletal:      Cervical back: Neck supple.   Neurological:      Mental Status: He is alert.          Allergies:     Naprosyn [naproxen]    [facility]:     Medication adherence and barriers to the treatment plan have been addressed. Opportunities to optimize healthy behaviors have been discussed. Patient / caregiver voiced understanding.      I attest that I, Zuling F Quade, personally documented this note while acting as scribe for [name], MD.      [name], Scribe.  07/17/2023     The documentation recorded by the scribe accurately reflects the service I personally performed and the decisions made by me.    [name], MD

## 2023-07-23 DIAGNOSIS — G8929 Other chronic pain: Principal | ICD-10-CM

## 2023-07-23 DIAGNOSIS — M25551 Pain in right hip: Principal | ICD-10-CM

## 2023-07-23 MED ORDER — TRAMADOL 50 MG TABLET
ORAL_TABLET | Freq: Two times a day (BID) | ORAL | 0 refills | 15.00 days | Status: CP | PRN
Start: 2023-07-23 — End: 2023-08-21

## 2023-07-23 NOTE — Unmapped (Signed)
 [facility] Delivery Pharm[facility] Refill Coordination Note    Specialty Lite Medication(s) to be Shipped:   Ozempic    *declined biktarvy at this time    Other medication(s) to be shipped: tramdol       Nathaniel Gibson, DOB: 07/25/1960  Phone: There are no phone numbers on file.      All above HIPAA information was verified with patient.     Was a Nurse, learning disability used for this call? No    Changes to medications: [name] reports no changes at this time.  Changes to insurance: No      REFERRAL TO PHARMACIST     Referral to the pharmacist: Not needed      [facility]     Shipping address confirmed in Epic.     Cost and Payment: Patient has a $0 copay, payment information is not required.    Delivery Scheduled: Yes, Expected medication delivery date: 3/21.     Medication will be delivered via Same Day Courier to the prescription address in Epic WAM.    [name]   [facility] Specialty Technician

## 2023-07-25 MED FILL — OZEMPIC 2 MG/DOSE (8 MG/3 ML) SUBCUTANEOUS PEN INJECTOR: SUBCUTANEOUS | 84 days supply | Qty: 9 | Fill #1

## 2023-07-25 MED FILL — TRAMADOL 50 MG TABLET: ORAL | 15 days supply | Qty: 60 | Fill #0

## 2023-07-28 NOTE — Unmapped (Signed)
 Abstraction Result Flowsheet Data    This patient's last AWV date: [facility] Last Medicare Wellness Visit Date: Not Found  This patients last WCC/CPE date: : 12/18/2021      Reason for Encounter  Reason for Encounter: Outreach  Primary Reason for Outreach: AWV  Text Message: No  MyChart Message: No  Outreach Call Outcome: No Voicemail Available

## 2023-08-12 NOTE — Unmapped (Signed)
 [facility] Refill Coordination Note    Specialty Medication(s) to be Shipped:   Infectious Disease: Biktarvy    Other medication(s) to be shipped:  lansoprazole 30 MG capsule (PREVACID)     Nathaniel Gibson, DOB: June 16, 1960  Phone: There are no phone numbers on file.      All above HIPAA information was verified with patient.     Was a Nurse, learning disability used for this call? No    Completed refill call assessment today to schedule patient's medication shipment from the [facility]  [phone]).  All relevant notes have been reviewed.     Specialty medication(s) and dose(s) confirmed: Regimen is correct and unchanged.   Changes to medications: Nathaniel Gibson reports no changes at this time.  Changes to insurance: No  New side effects reported not previously addressed with a pharmacist or physician: None reported  Questions for the pharmacist: No    Confirmed patient received a Conservation officer, historic buildings and a Surveyor, mining with first shipment. The patient will receive a drug information handout for each medication shipped and additional FDA Medication Guides as required.       DISEASE/MEDICATION-SPECIFIC INFORMATION        N/A    SPECIALTY MEDICATION ADHERENCE     Medication Adherence    Patient reported X missed doses in the last month: 0  Specialty Medication: bictegrav-emtricit-tenofov ala: BIKTARVY 50-200-25 mg tablet  Patient is on additional specialty medications: No  Patient is on more than two specialty medications: No              Were doses missed due to medication being on hold? No    BIKTARVY 50-200-25 mg tablet (bictegrav-emtricit-tenofov ala)  : 5 days of medicine on hand         REFERRAL TO PHARMACIST     Referral to the pharmacist: Not needed      [facility]     Shipping address confirmed in Epic.     Cost and Payment: Patient has a $0 copay, payment information is not required.    Delivery Scheduled: Yes, Expected medication delivery date: 08/14/23. Medication will be delivered via Same Day Courier to the prescription address in Epic WAM.    Nathaniel Gibson   [facility]  Specialty Technician

## 2023-08-14 MED FILL — LANSOPRAZOLE 30 MG CAPSULE,DELAYED RELEASE: ORAL | 90 days supply | Qty: 90 | Fill #1

## 2023-08-14 MED FILL — BIKTARVY 50 MG-200 MG-25 MG TABLET: 30 days supply | Qty: 30 | Fill #2

## 2023-09-02 DIAGNOSIS — G8929 Other chronic pain: Principal | ICD-10-CM

## 2023-09-02 DIAGNOSIS — M25551 Pain in right hip: Principal | ICD-10-CM

## 2023-09-02 DIAGNOSIS — J449 Chronic obstructive pulmonary disease, unspecified: Principal | ICD-10-CM

## 2023-09-02 MED ORDER — TRAMADOL 50 MG TABLET
ORAL_TABLET | Freq: Two times a day (BID) | ORAL | 0 refills | 15.00000 days | Status: CP | PRN
Start: 2023-09-02 — End: 2023-10-01

## 2023-09-02 MED ORDER — TIOTROPIUM BROMIDE 18 MCG CAPSULE WITH INHALATION DEVICE
ORAL_CAPSULE | Freq: Every day | RESPIRATORY_TRACT | 2 refills | 30.00000 days | Status: CP
Start: 2023-09-02 — End: 2023-12-01

## 2023-09-02 MED ORDER — LIDOCAINE 5 % TOPICAL PATCH
MEDICATED_PATCH | TRANSDERMAL | 2 refills | 30.00000 days | Status: CP
Start: 2023-09-02 — End: ?

## 2023-09-02 NOTE — Unmapped (Signed)
 Copied from CRM #[id number]. Topic: Access To Clinicians - Medication Refill  >> Sep 02, 2023 10:29 AM Velinda Getting wrote:  The caller reports that they have previously requested refill from pharmacy, but have not received authorization yet.     The patient  is requesting the following:     Medication(s) for refill: traMADol (ULTRAM) 50   mg tablet     tiotropium (SPIRIVA) 18 mcg inhalation capsule   Quantity for 3 month supply    Pharmacy name and address: [facility]  [address] [place] [address]  Phone: [phone]  Fax: [phone]      They need assistance with their medication(s). Dispense amount needs to be changed to double because pt puts a patch on both knees so they go fast..     Medication Name(s): lidocaine (LIDODERM) 5 % patch     Pharmacy: [facility]  [address] [place] [address]  Phone: [phone]  Fax: [phone]          Please contact The patient by [phone] in regards to this request.    Coverage: yes, coverage is accurate on file.    Urgent callback turnaround time: within 24 business hours. (Caller Notified)    Urgent Reason: Almost or completely out of medication(s)        Please contact The patient by [phone] in regards to this request.    Coverage: yes, coverage is accurate on file.    Urgent callback turnaround time: within 24 business hours. (Caller Notified)    Urgent Reason: Almost or completely out of medication(s)

## 2023-09-02 NOTE — Unmapped (Signed)
 See request for modifying Lido patch order to double    Patient is requesting the following refill  Requested Prescriptions     Pending Prescriptions Disp Refills    traMADol (ULTRAM) 50 mg tablet 60 tablet 0     Sig: Take 1-2 tablets (50-100 mg total) by mouth two (2) times a day as needed.    tiotropium (SPIRIVA) 18 mcg inhalation capsule 30 capsule 2     Sig: Place 1 capsule (18 mcg total) into inhaler and inhale daily.    lidocaine (LIDODERM) 5 % patch 30 patch 2     Sig: Place 1 patch on the affected area for 12 hours only each day (then remove patch) as directed       Recent Visits  Date Type Provider Dept   07/17/23 Office Visit Michal Agar, MD [facility] [address] [facility]   05/16/23 Office Visit Michal Agar, MD [place] [facility] [address] 80 [facility]   04/09/23 Office Visit Michal Agar, MD [place] [facility] [address] [place]   03/13/23 Office Visit Michal Agar, MD [place] [facility] [address] [facility]   11/13/22 Office Visit Michal Agar, MD [place] [facility] [address] 36 [facility]   10/02/22 Office Visit Michal Agar, MD [place] [facility] [address] [place]   Showing recent visits within past 365 days and meeting all other requirements  Future Appointments  Date Type Provider Dept   09/18/23 Appointment Michal Agar, MD  Family Medicine [address] [place]   Showing future appointments within next 365 days and meeting all other requirements

## 2023-09-02 NOTE — Unmapped (Signed)
 They need assistance with their medication(s). Dispense amount needs to be changed to double because pt puts a patch on both knees and it goes fast.     Medication Name(s): lidocaine (LIDODERM) 5 % patch     Pharmacy: [facility]  [address], [place] [address]  Phone: [phone]  Fax: [phone]

## 2023-09-02 NOTE — Unmapped (Signed)
 Addended by: Shone Leventhal C on: 09/02/2023 11:26 AM     Modules accepted: Orders

## 2023-09-15 NOTE — Unmapped (Unsigned)
 Assessment and Plan:     There are no diagnoses linked to this encounter.  Multiple diagnoses above reviewed with patient  Continue current medication management  Rx as per orders; reviewed risks/benefits, possible side effects and patient verbalizes understanding  Referrals/labs as per orders above  Recommend supportive treatment with lifestyle modifications with diet/exercise  Follow up in 3-4 months or sooner prn      Barriers to recommended plan: {barrierstocare:74100}    I provided an intervention for the {SDOH VHQION:62952} SDOH domain. The intervention was {SDOH INTERVENTION:69735}     PHQ-2 Score:      PHQ-9 Score:      {select_status_or_delete_smartlist:64641}      I personally spent *** minutes face-to-face and non-face-to-face in the care of this patient, which includes all pre, intra, and post visit time (reviewing, evaluating and discussing pertinent records for patient's care) on the date of service.     No follow-ups on file.    Subjective:     HPI: Nathaniel Gibson is a 63 y.o. male here for follow up CDM.    COPD  Using Spiriva inhaler daily and albuterol prn.    DM2  On Ozempic 2 mg weekly injection, metformin 500 mg twice daily. Tolerating meds well. Last A1c was 6.1 (03/13/23). Patient continues working on [employer] and regular exercise for weight loss.    Hypertension  Patient with history of hypertension manages with Catapres 0.1 mg nightly and lisinopril 20 mg daily. Blood pressure at triage today ***. Denies CP, SOB, L/D or peripheral edema.    BP Readings from Last 3 Encounters:   07/17/23 137/85   07/14/23 138/74   05/16/23 126/87         HPI       ROS:   Review of Systems     Review of systems negative unless otherwise noted as per HPI.      The following portions of the patient's history were reviewed and updated as appropriate: allergies, current medications, past family history, past medical history, past social history, past surgical history and problem list.     Objective:     There were no vitals filed for this visit.  There is no height or weight on file to calculate BMI.    Physical Exam     Allergies:     Naprosyn [naproxen]    [facility]:     Medication adherence and barriers to the treatment plan have been addressed. Opportunities to optimize healthy behaviors have been discussed. Patient / caregiver voiced understanding.      I attest that I, Teyonna Plaisted F Thurma Priego, personally documented this note while acting as scribe for Michal Agar, MD.      Lawanda Prayer, Scribe.  09/18/2023     The documentation recorded by the scribe accurately reflects the service I personally performed and the decisions made by me.    Michal Agar, MD

## 2023-09-18 ENCOUNTER — Ambulatory Visit: Admit: 2023-09-18 | Payer: Medicare (Managed Care)

## 2023-09-19 DIAGNOSIS — G8929 Other chronic pain: Principal | ICD-10-CM

## 2023-09-19 DIAGNOSIS — M25551 Pain in right hip: Principal | ICD-10-CM

## 2023-09-19 MED ORDER — TRAMADOL 50 MG TABLET
ORAL_TABLET | Freq: Two times a day (BID) | ORAL | 0 refills | 23.00000 days | Status: CP | PRN
Start: 2023-09-19 — End: 2023-10-18
  Filled 2023-09-23: qty 90, 23d supply, fill #0

## 2023-09-19 MED ORDER — LIDOCAINE 5 % TOPICAL PATCH
MEDICATED_PATCH | TRANSDERMAL | 2 refills | 30.00000 days | Status: CP
Start: 2023-09-19 — End: ?
  Filled 2023-09-23: qty 30, 30d supply, fill #0

## 2023-09-19 NOTE — Unmapped (Signed)
 Unable to complete refill request, medication is not included in the refill protocol.  and due to duplicate therapy, please advise on how to proceed.  Please review below request.     Patient is requesting the following refill  Requested Prescriptions     Pending Prescriptions Disp Refills    lidocaine (LIDODERM) 5 % patch 30 patch 2     Sig: Place 1 patch on the affected area for 12 hours only each day (then remove patch) as directed       Recent Visits  Date Type Provider Dept   09/18/23 Appointment Michal Agar, MD [facility] [address] [facility]   07/17/23 Office Visit Michal Agar, MD [place] [facility] [address] [place] 72 [facility]   05/16/23 Office Visit Michal Agar, MD [place] [facility] [address] 25 [facility]   04/09/23 Office Visit Michal Agar, MD [place] [facility] [address] 38 [facility]   03/13/23 Office Visit Michal Agar, MD [place] [facility] [address] 41 [facility]   11/13/22 Office Visit Michal Agar, MD [place] [facility] [address] [place] 19 [facility]   10/02/22 Office Visit Michal Agar, MD [place] [facility] [address] [place]   Showing recent visits within past 365 days and meeting all other requirements  Future Appointments  No visits were found meeting these conditions.  Showing future appointments within next 365 days and meeting all other requirements       Labs: Not applicable this refill

## 2023-09-19 NOTE — Unmapped (Signed)
 [facility] Delivery Pharm[facility] Refill Coordination Note    Specialty Medication(s) to be Shipped:   Infectious Disease: Biktarvy    Other medication(s) to be shipped: atorvastatin  Fluticasone ns  Lidocaine patch  Lisinopril  Tramadol       Nathaniel Gibson, DOB: [birth year]  Phone: There are no phone numbers on file.      All above HIPAA information was verified with patient.     Was a Nurse, learning disability used for this call? No    Completed refill call assessment today to schedule patient's medication shipment from the [facility] Delivery Pharmacy  [phone]).  All relevant notes have been reviewed.     Specialty medication(s) and dose(s) confirmed: Regimen is correct and unchanged.   Changes to medications: Jayson reports no changes at this time.  Changes to insurance: No  New side effects reported not previously addressed with a pharmacist or physician: None reported  Questions for the pharmacist: No    Confirmed patient received a Conservation officer, historic buildings and a Surveyor, mining with first shipment. The patient will receive a drug information handout for each medication shipped and additional FDA Medication Guides as required.       DISEASE/MEDICATION-SPECIFIC INFORMATION        N/A    SPECIALTY MEDICATION ADHERENCE     Medication Adherence    Patient reported X missed doses in the last month: 0  Specialty Medication: BIKTARVY 50-200-25 mg  Patient is on additional specialty medications: No              Were doses missed due to medication being on hold? No    Biktarvy 50-200-25mg   : 12 days of medicine on hand       REFERRAL TO PHARMACIST     Referral to the pharmacist: Not needed      [facility]     Shipping address confirmed in Epic.     Cost and Payment: Patient has a $0 copay, payment information is not required.    Delivery Scheduled: Yes, Expected medication delivery date: 5/21.     Medication will be delivered via Next Day Courier to the prescription address in Epic WAM.    Nathaniel Gibson   [facility] Pharm[facility]  Specialty Technician

## 2023-09-23 MED FILL — LISINOPRIL 20 MG TABLET: ORAL | 90 days supply | Qty: 90 | Fill #2

## 2023-09-23 MED FILL — ATORVASTATIN 40 MG TABLET: ORAL | 90 days supply | Qty: 90 | Fill #2

## 2023-09-23 MED FILL — FLUTICASONE PROPIONATE 50 MCG/ACTUATION NASAL SPRAY,SUSPENSION: NASAL | 90 days supply | Qty: 48 | Fill #3

## 2023-09-23 MED FILL — BIKTARVY 50 MG-200 MG-25 MG TABLET: ORAL | 30 days supply | Qty: 30 | Fill #3

## 2023-09-24 DIAGNOSIS — J449 Chronic obstructive pulmonary disease, unspecified: Principal | ICD-10-CM

## 2023-09-24 MED ORDER — TIOTROPIUM BROMIDE 18 MCG CAPSULE WITH INHALATION DEVICE
ORAL_CAPSULE | Freq: Every day | RESPIRATORY_TRACT | 2 refills | 30.00000 days | Status: CP
Start: 2023-09-24 — End: 2023-12-23

## 2023-09-24 NOTE — Unmapped (Signed)
 Copied from CRM #[id number]. Topic: Scheduling - Confirm Appointment  >> Sep 24, 2023 11:08 AM Steva Ek wrote:  Caller asked to confirm appointment details    Additional Requests/Needs: Yes Clinical Related: Medication Refill: The caller reports that they have previously requested refill from pharmacy, but have not received authorization yet.     The patient is requesting the following:     Medication(s) for refill:   tiotropium (SPIRIVA) 18 mcg inhalation capsule       Quantity for 1 month supply    Pharmacy name and address: [facility] [phone] @ [address] [place], [place] - [address]  [address] Nathaniel Gibson [place] [address]  Phone: [phone]  Fax: [phone]        Please contact The patient by Cell Phone in regards to this request.    Coverage: yes, coverage is accurate on file.    Urgent callback turnaround time: within 24 business hours. (Caller Notified)    Urgent Reason: Original Requested was more than 72 business hours. Pt states that he has put in 3 requests and has not gotten the medication yet. Pt wants a call back from the nurse.

## 2023-09-24 NOTE — Unmapped (Signed)
 Unable to complete refill request, medication was previously discontinued on 09/02/23, and is being requested by the pharmacy.  Please advise on how to proceed.    Patient is requesting the following refill  Requested Prescriptions     Pending Prescriptions Disp Refills    tiotropium (SPIRIVA) 18 mcg inhalation capsule 30 capsule 2     Sig: Place 1 capsule (18 mcg total) into inhaler and inhale daily.       Recent Visits  Date Type Provider Dept   07/17/23 Office Visit Michal Agar, MD [facility] [address] [facility]   05/16/23 Office Visit Michal Agar, MD [place] [facility] [address] [place] 86 [facility]   04/09/23 Office Visit Michal Agar, MD [place] [facility] [address] [place] 43 [facility]   03/13/23 Office Visit Michal Agar, MD [place] [facility] [address] [place] 44 [facility]   11/13/22 Office Visit Michal Agar, MD [place] [facility] [address] [place] 75 [facility]   10/02/22 Office Visit Michal Agar, MD [place] [facility] [address] [place]   Showing recent visits within past 365 days and meeting all other requirements  Future Appointments  Date Type Provider Dept   10/30/23 Appointment Michal Agar, MD [place] [facility] [address] [place]   Showing future appointments within next 365 days and meeting all other requirements       Labs: Not applicable this refill

## 2023-09-24 NOTE — Unmapped (Signed)
 Pt no longer using [facility] home delivery he is using [facility] shared services,  Resent rx to [facility] shared services and informed the pt it was sent

## 2023-09-25 MED FILL — ALBUTEROL SULFATE HFA 90 MCG/ACTUATION AEROSOL INHALER: RESPIRATORY_TRACT | 34 days supply | Qty: 17 | Fill #1

## 2023-09-25 MED FILL — METFORMIN 500 MG TABLET: ORAL | 90 days supply | Qty: 180 | Fill #1

## 2023-09-25 MED FILL — CLONIDINE HCL 0.1 MG TABLET: ORAL | 90 days supply | Qty: 90 | Fill #2

## 2023-09-30 ENCOUNTER — Emergency Department
Admit: 2023-09-30 | Discharge: 2023-09-30 | Disposition: A | Discharge status: Home / Self Care | Payer: Medicare (Managed Care)

## 2023-09-30 DIAGNOSIS — R109 Unspecified abdominal pain: Principal | ICD-10-CM

## 2023-09-30 DIAGNOSIS — R197 Diarrhea, unspecified: Principal | ICD-10-CM

## 2023-09-30 DIAGNOSIS — K219 Gastro-esophageal reflux disease without esophagitis: Principal | ICD-10-CM

## 2023-09-30 LAB — COMPREHENSIVE METABOLIC PANEL
ALBUMIN: 4 g/dL (ref 3.4–5.0)
ALKALINE PHOSPHATASE: 81 U/L (ref 46–116)
ALT (SGPT): 18 U/L (ref 10–49)
ANION GAP: 11 mmol/L (ref 5–14)
AST (SGOT): 20 U/L (ref <=34)
BILIRUBIN TOTAL: 1 mg/dL (ref 0.3–1.2)
BLOOD UREA NITROGEN: 10 mg/dL (ref 9–23)
BUN / CREAT RATIO: 11
CALCIUM: 9.5 mg/dL (ref 8.7–10.4)
CHLORIDE: 103 mmol/L (ref 98–107)
CO2: 30.5 mmol/L (ref 20.0–31.0)
CREATININE: 0.9 mg/dL (ref 0.73–1.18)
EGFR CKD-EPI (2021) MALE: >90 mL/min/1.73m2 (ref >=60)
GLUCOSE RANDOM: 101 mg/dL (ref 70–179)
POTASSIUM: 4.2 mmol/L (ref 3.4–4.8)
PROTEIN TOTAL: 7.7 g/dL (ref 5.7–8.2)
SODIUM: 144 mmol/L (ref 135–145)

## 2023-09-30 LAB — CBC W/ AUTO DIFF
BASOPHILS ABSOLUTE COUNT: 0.1 10*9/L (ref 0.0–0.1)
BASOPHILS RELATIVE PERCENT: 1.1 %
EOSINOPHILS ABSOLUTE COUNT: 0.1 10*9/L (ref 0.0–0.5)
EOSINOPHILS RELATIVE PERCENT: 1.9 %
HEMATOCRIT: 41.8 % (ref 39.0–48.0)
HEMOGLOBIN: 14.3 g/dL (ref 12.9–16.5)
LYMPHOCYTES ABSOLUTE COUNT: 3.1 10*9/L (ref 1.1–3.6)
LYMPHOCYTES RELATIVE PERCENT: 53.1 %
MEAN CORPUSCULAR HEMOGLOBIN CONC: 34.3 g/dL (ref 32.0–36.0)
MEAN CORPUSCULAR HEMOGLOBIN: 31.5 pg (ref 25.9–32.4)
MEAN CORPUSCULAR VOLUME: 91.9 fL (ref 77.6–95.7)
MEAN PLATELET VOLUME: 8.1 fL (ref 6.8–10.7)
MONOCYTES ABSOLUTE COUNT: 0.7 10*9/L (ref 0.3–0.8)
MONOCYTES RELATIVE PERCENT: 11.2 %
NEUTROPHILS ABSOLUTE COUNT: 1.9 10*9/L (ref 1.8–7.8)
NEUTROPHILS RELATIVE PERCENT: 32.7 %
NUCLEATED RED BLOOD CELLS: 0 /100{WBCs} (ref <=4)
PLATELET COUNT: 237 10*9/L (ref 150–450)
RED BLOOD CELL COUNT: 4.55 10*12/L (ref 4.26–5.60)
RED CELL DISTRIBUTION WIDTH: 14.8 % (ref 12.2–15.2)
WBC ADJUSTED: 5.9 10*9/L (ref 3.6–11.2)

## 2023-09-30 LAB — LIPASE: LIPASE: 26 U/L (ref 12–53)

## 2023-09-30 MED ADMIN — pantoprazole (Protonix) injection 40 mg: 40 mg | INTRAVENOUS | @ 14:00:00 | Stop: 2023-09-30

## 2023-09-30 MED ADMIN — iohexol (OMNIPAQUE) 350 mg iodine/mL solution 100 mL: 100 mL | INTRAVENOUS | @ 16:00:00 | Stop: 2023-09-30

## 2023-09-30 NOTE — Unmapped (Signed)
 Pt c/p lower abd pain with nausea for the past week.  The pain is like when I had bleeding ulcers but I havent seen any blood. Pt is in NAD.

## 2023-09-30 NOTE — Unmapped (Signed)
 [facility]  Emergency Department Provider Note     ED Clinical Impression     Final diagnoses:   Diarrhea, unspecified type (Primary)   Abdominal pain, unspecified abdominal location        History     Chief Complaint  Chief Complaint   Patient presents with    Abdominal Pain       HPI   Nathaniel Gibson is a 63 y.o. male with a past medical history of HTN, HLD, HIV on Biktarvy, T2DM, GERD, acute gastric ulcer with hemorrhage, COPD, and anemia presenting for diarrhea. The patient reports 1 week of 3-4 episodes of soft, watery diarrhea daily with associated burning, non-radiating lower abdominal pain. He states that he experienced an episode of nausea with NBNB emesis upon the onset of his symptoms on 05/20, but notes that this has since resolved. On evaluation, he reports that his most recent episode of diarrhea was 2 hours ago. He states that eating and drinking does not exacerbate his pain. He has not taken any laxatives. Of note, he endorses a history of a hemorrhagic gastric ulcer which required surgery and transfusion ~6 years prior, and states this instance feels similar.  He notes that he recently finished a recent course of 100mg  of doxycycline 2 months ago. He reports compliance with taking Biktarvy and Metformin, however, notes he recently ran out of Metformin for 3-4 days. Denies hematochezia, coffee ground emesis, melena, mucus in stool, dysuria, hematuria, fevers, chills, current nausea, chest pain or shortness of breath.     Impression, Medical Decision Making, ED Course     Impression and MDM: 63 y.o. male with the above history presenting secondary to diarrhea and mild abdominal discomfort as discussed above.  On presentation he is well-appearing no acute distress and nontoxic.  Vitals with blood pressure 140/90, heart rate 62, respirations 18, O2 saturation 96% on room air.  Afebrile at 97.10F.  Exhibits a entirely benign examination with a soft and nontender abdomen without rebound or guarding. Considered biliary pathology though no focal RUQ tenderness, normal LFTs and alkaline phosphatase make this extremely unlikely.  Normal lipase suggests pancreatitis.  CT abdomen and pelvis without evidence of SBO, diverticulitis, perforation, or other complications at this time.  Patient has fairly well-controlled HIV with extremely low viral RNA levels previously and last CD4 count on 04/24/2023 noted to be 1484 making Cryptosporidium and mycoplasma on likely etiologies of diarrhea.  Notes having a recent antibiotic prescription within the last 1 to 2 months, which does place him at high risk for infection such as C. difficile.  C. difficile testing and GI pathogen panel is currently pending.  There is possibility of PUD/gastric ulceration given his similarity of symptoms previously, though he has no rectal bleeding or melena.  Advised to continue his lansoprazole and provided referral to GI for further evaluation and potential endoscopy.  Given his reassuring examination and abdominal imaging at this time, believe he is appropriate to follow-up results on MyChart.  Advised if any results are positive to follow-up with his primary care physician for further management.  Discussed return precautions, appropriate for discharge.            I have reviewed recent and relavant previous record, including: 07/17/2023 Fam Med note for PMH.  Escalation of Care including OBS/Admission/Transfer was considered: However, patient was determined to be appropriate for outpatient management. See progress note for additional detail.  Social Determinants that significantly affected care: None known.    The case  was discussed with the attending physician, who is in agreement with the above assessment and plan.     Orders Placed This Encounter   Procedures    Clostridium difficile Assay    GI Pathogen Panel (instead of Stool O&P)    CT Abdomen Pelvis W IV Contrast Only    CBC w/ Differential    Comprehensive metabolic panel    Lipase Enteric Isolation Status          Physical Exam     VITAL SIGNS:      Vitals:    09/30/23 0857 09/30/23 1307   BP: 140/90 136/86   Pulse: 62 58   Resp: 18 18   Temp: 36.3 ??C (97.3 ??F)    TempSrc: Temporal    SpO2: 97% 96%       Constitutional: Alert and oriented. No acute distress.  Eyes: Conjunctivae are normal.  HEENT: Normocephalic and atraumatic. Conjunctivae clear.   Cardiovascular: Rate as above, regular rhythm. Normal and symmetric distal pulses. No lower extremity edema  Respiratory: Normal respiratory effort. Clear breath sounds bilaterally. No wheezes, rhonchi, rales.   Gastrointestinal: Soft non-tender abdomen with no rebound or guarding.   Genitourinary: Deferred.  Musculoskeletal: Non-tender with normal range of motion in all extremities.  Neurologic: Normal speech and language. No facial asymmetry. No gross focal neurologic deficits are appreciated. Patient moves all extremities equally.  Skin: Skin warm, dry and intact. No rash or erythema noted.  Psychiatric: Mood, affect, and speech grossly normal.      Other History     Past Medical History:   Diagnosis Date    Acute gastric ulcer with hemorrhage 03/18/2022    Allergic rhinitis     Anemia 07/23/2017    Diabetes mellitus      Disease of thyroid gland     Effusion of lower leg joint     GERD (gastroesophageal reflux disease)     High cholesterol     HIV (human immunodeficiency virus infection)      Hypertension     Localized, primary osteoarthritis of lower leg     Obesity, unspecified     Organic sleep apnea, unspecified     Pain in joint, multiple sites     Pterygium, unspecified     Smoker     Snores     Unspecified gingival and periodontal disease        Past Surgical History:   Procedure Laterality Date    NO PAST SURGERIES      PR COLONOSCOPY W/BIOPSY SINGLE/MULTIPLE Left 12/06/2014    Procedure: COLONOSCOPY, FLEXIBLE, PROXIMAL TO SPLENIC FLEXURE; WITH BIOPSY, SINGLE OR MULTIPLE;  Surgeon: Daris Edman, MD;  Location: [facility];  Service: Gastroenterology    PR COLSC FLEXIBLE W/CONTROL BLEEDING ANY METHOD Left 12/06/2014    Procedure: COLONOSCOPY, FLEXIBLE, PROXIMAL TO SPLENIC FLEXURE; DX, W/CONTROL OF BLEEDING;  Surgeon: Daris Edman, MD;  Location: [facility];  Service: Gastroenterology    PR COLSC FLX W/RMVL OF TUMOR POLYP LESION SNARE TQ Left 12/06/2014    Procedure: COLONOSCOPY FLEX; W/REMOV TUMOR/LES BY SNARE;  Surgeon: Daris Edman, MD;  Location: [facility];  Service: Gastroenterology    PR COLSC FLX WITH DIRECTED SUBMUCOSAL NJX ANY SBST Left 12/06/2014    Procedure: COLONOSCOPY, FLEXIBLE, PROXIMAL TO SPLENIC FLEXURE; WITH DIRECTED SUBMUCOSAL INJECTION(S), ANY SUBSTANCE;  Surgeon: Daris Edman, MD;  Location: [facility];  Service: Gastroenterology    PR EAR CARTILAGE GRAFT  TO FACE Right 05/24/2021    Procedure: GRAFT; EAR CARTILAGE, AUTOGENOUS, TO NOSE OR EAR (INCLUDES OBTAINING GRAFT);  Surgeon: Marlyne Sing, MD;  Location: MAIN OR [facility];  Service: ENT    PR GRAFTING OF AUTOLOGOUS SOFT TISS BY DIRECT EXC Right 05/24/2021    Procedure: GRAFTING OF AUTOLOGOUS SOFT TISSUE, OTHER, HARVESTED BY DIRECT EXCISION (EG, FAT, DERMIS, FASCIA);  Surgeon: Marlyne Sing, MD;  Location: MAIN OR [facility];  Service: ENT    PR MICROSURG TECHNIQUES,REQ OPER MICROSCOPE N/A 05/24/2021    Procedure: MICROSURGICAL TECHNIQUES, REQUIRING USE OF OPERATING MICROSCOPE (LIST SEPARATELY IN ADDITION TO CODE FOR PRIMARY PROCEDURE);  Surgeon: Marlyne Sing, MD;  Location: MAIN OR [facility] [address];  Service: ENT    PR TYMPANOPLAS/MASTOIDEC,RAD,REBLD OSSI Right 05/24/2021    Procedure: TYMPANOPLASTY W/MASTOIDEC; RAD Jackey Mary;  Surgeon: Marlyne Sing, MD;  Location: MAIN OR [facility];  Service: ENT         Current Facility-Administered Medications:     triamcinolone acetonide (KENALOG-40) injection 20 mg, 20 mg, Intra-articular, Once, Berkoff, David Joseph, MD    triamcinolone acetonide (KENALOG-40) injection 20 mg, 20 mg, Intra-articular, Once, Berkoff, David Joseph, MD    Current Outpatient Medications:     acetaminophen (TYLENOL 8 HOUR) 650 MG CR tablet, Take 1 tablet (650 mg total) by mouth every eight (8) hours as needed for pain., Disp: , Rfl:     acetaminophen (TYLENOL) 500 MG tablet, Take one tablet every 8 hours for post-surgical discomfort, Disp: 40 tablet, Rfl: 1    albuterol HFA 90 mcg/actuation inhaler, Inhale 2 puffs by mouth every four (4) hours as needed for wheezing., Disp: 17 g, Rfl: 2    amoxicillin (AMOXIL) 500 MG capsule, Take one capsules three times daily for 5 days, Disp: 15 capsule, Rfl: 1    atorvastatin (LIPITOR) 40 MG tablet, Take 1 tablet (40 mg) by mouth daily., Disp: 90 tablet, Rfl: 4    bictegrav-emtricit-tenofov ala (BIKTARVY) 50-200-25 mg tablet, Take 1 tablet by mouth daily., Disp: 30 tablet, Rfl: 5    BIKTARVY 50-200-25 mg tablet, TAKE 1 TABLET BY MOUTH DAILY, Disp: 30 tablet, Rfl: 1    blood sugar diagnostic (ON CALL EXPRESS TEST STRIP) Strp, Check blood sugar as directed once a day and for symptoms of high or low blood sugar., Disp: 200 each, Rfl: 4    blood-glucose meter (ON CALL EXPRESS METER) Misc, USE AS DIRECTED TO TEST BLOOD SUGAR, Disp: 1 each, Rfl: 0    blood-glucose meter kit, Use as instructed, Disp: 1 each, Rfl: 0    chlorhexidine (PERIDEX) 0.12 % solution, Swish and spit 15ml for at least 1 minute two times per day, Disp: 473 mL, Rfl: 0    cloNIDine HCL (CATAPRES) 0.1 MG tablet, Take 1 tablet (0.1 mg total) by mouth nightly., Disp: 90 tablet, Rfl: 3    cyclobenzaprine (FLEXERIL) 10 MG tablet, Take 1 tablet (10 mg total) by mouth Three (3) times a day as needed., Disp: 60 tablet, Rfl: 2    dexAMETHasone (DECADRON) 4 MG tablet, Take one tablet every 24 hours for the first two days after surgery, Disp: 2 tablet, Rfl: 1    doxycycline (VIBRAMYCIN) 100 MG capsule, Take 1 capsule (100 mg total) by mouth two (2) times a day., Disp: 20 capsule, Rfl: 0 fluticasone propionate (FLONASE) 50 mcg/actuation nasal spray, Use 2 sprays in each nostril daily., Disp: 16 g, Rfl: 11    ibuprofen (MOTRIN) 800 MG tablet, Take one tablet every 8 hours for  post-surgical discomfort. Take in-between intake of Tyelenol., Disp: 30 tablet, Rfl: 1    lancing device (ON CALL LANCING DEVICE) Misc, USE AS DIRECTED TO CHECK YOUR BLOOD SUGAR, Disp: 1 each, Rfl: 4    lansoprazole (PREVACID) 30 MG capsule, Take 1 capsule (30 mg total) by mouth daily., Disp: 90 capsule, Rfl: 3    lidocaine (LIDODERM) 5 % patch, Apply 1 patch externally to the affected area for 12 hours only each day, then remove patch as directed, Disp: 30 patch, Rfl: 2    lidocaine (LIDODERM) 5 % patch, Place 1 patch on the affected area for 12 hours only each day (then remove patch) as directed, Disp: 30 patch, Rfl: 2    lidocaine (LIDODERM) 5 % patch, Place 1 patch on the affected area for 12 hours only each day (then remove patch) as directed, Disp: 30 patch, Rfl: 2    lisinopril (PRINIVIL,ZESTRIL) 20 MG tablet, Take 1 tablet (20 mg) by mouth daily., Disp: 90 tablet, Rfl: 3    metFORMIN (GLUCOPHAGE) 500 MG tablet, Take 1 tablet (500 mg) by mouth in the morning and in the evening with meals., Disp: 180 tablet, Rfl: 3    multivitamin (THERAGRAN) per tablet, Take 1 tablet by mouth daily., Disp: , Rfl:     nicotine polacrilex (NICORETTE MINI) lozenge 4 MG, Apply 1 lozenge (4 mg total) to cheek every four (4) hours as needed for smoking cessation., Disp: 72 each, Rfl: 2    oxyCODONE (ROXICODONE) 5 MG immediate release tablet, Take one tablet every 6-8 hours as needed for pain after oral surgery., Disp: 5 tablet, Rfl: 0    polyethylene glycol (GOLYTELY) 236-22.74-6.74 gram solution, Take by mouth as directed per [facility] GI prep instructions, for split bowel prep., Disp: 4000 mL, Rfl: 0    predniSONE (DELTASONE) 10 MG tablet, Take 6 tabs po once day 1, then decrease by 10mg  (1 tab) every day until finished, Disp: 21 tablet, Rfl: 0 QUEtiapine (SEROQUEL) 25 MG tablet, Take 1 tablet (25 mg total) by mouth nightly., Disp: , Rfl:     semaglutide (OZEMPIC) 2 mg/dose (8 mg/3 mL) PnIj, Inject 2 mg under the skin every seven (7) days., Disp: 9 mL, Rfl: 3    semaglutide (OZEMPIC) 2 mg/dose (8 mg/3 mL) PnIj, Inject 2mg  under the skin every 7 days, Disp: 9 mL, Rfl: 3    sertraline (ZOLOFT) 100 MG tablet, Take 2 tablets (200 mg total) by mouth daily., Disp: 180 tablet, Rfl: 3    tiotropium (SPIRIVA) 18 mcg inhalation capsule, Place 1 capsule (18 mcg total) into inhaler and inhale daily., Disp: 30 capsule, Rfl: 2    tiotropium (SPIRIVA) 18 mcg inhalation capsule, Place 1 capsule (18 mcg total) into inhaler and inhale daily., Disp: 30 capsule, Rfl: 2    traMADol (ULTRAM) 50 mg tablet, Take 1-2 tablets (50-100 mg total) by mouth two (2) times a day as needed., Disp: 60 tablet, Rfl: 0    traMADol (ULTRAM) 50 mg tablet, Take 1-2 tablets (50-100 mg total) by mouth two (2) times a day as needed., Disp: 90 tablet, Rfl: 0    Allergies  Naprosyn [naproxen]    Family History  Family History   Problem Relation Age of Onset    Diabetes Mother     Hypertension Mother     Kidney disease Mother     Cancer Mother         Breast    Diabetes Father     Heart disease Father  CHF    No Known Problems Brother     No Known Problems Sister     No Known Problems Maternal Aunt     No Known Problems Maternal Uncle     No Known Problems Paternal Aunt     No Known Problems Paternal Uncle     No Known Problems Maternal Grandmother     No Known Problems Maternal Grandfather     No Known Problems Paternal Grandmother     No Known Problems Paternal Grandfather     No Known Problems Other     Alcohol abuse Neg Hx     Depression Neg Hx     Drug abuse Neg Hx     Mental illness Neg Hx     Stroke Neg Hx     Anesthesia problems Neg Hx     Broken bones Neg Hx     Clotting disorder Neg Hx     Collagen disease Neg Hx     Dislocations Neg Hx     Fibromyalgia Neg Hx     Gout Neg Hx Hemophilia Neg Hx     Osteoporosis Neg Hx     Rheumatologic disease Neg Hx     Scoliosis Neg Hx     Severe sprains Neg Hx     Sickle cell anemia Neg Hx     Spinal Compression Fracture Neg Hx        Social History  Social History     Tobacco Use    Smoking status: Every Day     Current packs/day: 1.00     Average packs/day: 1 pack/day for 49.4 years (49.4 ttl pk-yrs)     Types: Cigarettes     Start date: 1976    Smokeless tobacco: Never    Tobacco comments:     1 pack lasts 3 days    Vaping Use    Vaping status: Never Used   Substance Use Topics    Alcohol use: No     Alcohol/week: 0.0 standard drinks of alcohol    Drug use: No        Radiology     CT Abdomen Pelvis W IV Contrast Only   Final Result   No acute findings within the abdomen and pelvis.             Pertinent labs & imaging results that were available during my care of the patient were independently interpreted by me and considered in my medical decision making (see chart for details).    Portions of this record have been created using Scientist, clinical (histocompatibility and immunogenetics). Dictation errors have been sought, but may not have been identified and corrected.    Documentation assistance was provided by Darshini Muthukumar and Abigail Gillikin, Scribes on Sep 30, 2023 at 9:02 AM for [facility], DO.    Sep 30, 2023 2:38 PM. Documentation assistance provided by the scribe. I was present during the time the encounter was recorded. The information recorded by the scribe was done at my direction and has been reviewed and validated by me.        Juventino Pavone, Altamease Jes, MD  Resident  09/30/23 [phone]

## 2023-10-14 NOTE — Unmapped (Signed)
 Nathaniel Gibson has been contacted in regards to their refill of BIKTARVY 50-200-25 mg tablet (bictegrav-emtricit-tenofov ala). At this time, they have declined refill due to 3 weeks of medication. Refill assessment call date has been updated per the patient's request.

## 2023-10-28 ENCOUNTER — Ambulatory Visit
Admit: 2023-10-28 | Discharge: 2023-10-29 | Payer: Medicare (Managed Care) | Attending: Emergency Medicine | Primary: Emergency Medicine

## 2023-10-28 DIAGNOSIS — M170 Bilateral primary osteoarthritis of knee: Principal | ICD-10-CM

## 2023-10-28 MED ADMIN — triamcinolone acetonide (KENALOG) injection 20 mg: 20 mg | INTRA_ARTICULAR | @ 14:00:00 | Stop: 2023-10-28

## 2023-10-28 MED FILL — OZEMPIC 2 MG/DOSE (8 MG/3 ML) SUBCUTANEOUS PEN INJECTOR: SUBCUTANEOUS | 84 days supply | Qty: 9 | Fill #2

## 2023-10-28 NOTE — Unmapped (Signed)
 Plan:VISIT SUMMARY:  Today, you were seen for knee pain that has been bothering you for some time. We discussed your ongoing issues with hip osteoarthritis and your desire to quit smoking.    YOUR PLAN:  -PRIMARY OSTEOARTHRITIS OF HIPS, BILATERAL: Osteoarthritis is a condition where the protective cartilage that cushions the ends of your bones wears down over time. We discussed the importance of maintaining joint mobility through regular walking. You recently received injections to help manage the pain. If the pain continues, we may consider referring you to a pain clinic.    -NICOTINE  DEPENDENCE: Nicotine  dependence means you are addicted to the nicotine  in tobacco. We talked about the health risks associated with smoking and your intention to quit. I advised you to stop smoking to improve your overall health.    INSTRUCTIONS:  Please continue with regular walking to help manage your osteoarthritis symptoms. If your pain persists, let us  know so we can consider a referral to a pain clinic. Additionally, work on quitting smoking to improve your health. Follow up with us  if you need further assistance or support.      Thank you for coming to [facility] and our clinic today!     We aim to provide you with the highest quality, individualized care.  If you have any unanswered questions after the visit, please do not hesitate to reach out to us  on MyChart or leave a message for the nurse.  ?  MyChart messages: These messages can be sent to your provider and will be checked by their clinical support staff.? The messages are checked throughout the day during normal business hours from 8:30 am-4:00 pm Monday-Friday, however responses may take up to 48 hours.? Please use this method of communication for non-urgent and non-emergent concerns, questions, refill requests or inquiries only.? ?Our team will help respond to all of your questions.? Please note that you may be asked to see a provider by either a telehealth or in person visit if it is deemed your questions are best handled in the clinic setting in person.??  ?  Please keep in mind, these messages are not real time communications, so be patient when waiting for a response.    If you do not have access to MyChart, do not know how to use MyChart or have an issue that may require more extensive discussion, please call the nurses' call line: [phone].? This line is checked throughout the day and will be responded to as time allows.? Please note that return calls could take up to 48 hours, depending on the nature of the need.?  ?  If you have an issue that requires emergent attention that cannot wait; either call the Orthopaedics resident on call at [phone], consider coming to our [facility] walk-in clinic, or go to the nearest Emergency Department.    If you need to schedule future appointments, please call [phone].     We look forward to seeing you again in the future and appreciate you choosing [facility] for your care!    Thank you,                We provide innovative and comprehensive patient centered care that is supported by evidence-based research  RESEARCH PARTICIPATION    Please check out our current research studies to see if you or someone you know may qualify at:    [email or web address]

## 2023-10-28 NOTE — Unmapped (Signed)
 SPORTS MEDICINE RETURN VISIT    ASSESSMENT AND PLAN      Diagnosis ICD-10-CM Associated Orders   1. Primary osteoarthritis of knees, bilateral  M17.0                Assessment & Plan  Primary osteoarthritis of hips, bilateral  Chronic bilateral hip osteoarthritis. Emphasized joint mobility through walking. Recent injections for pain management. Explained activity's role in symptom management.  - Encourage regular walking.  - Consider pain clinic referral if pain persists.    Nicotine  dependence  Ongoing nicotine  dependence. Acknowledged health risks and intention to quit.  - Advise smoking cessation.    No follow-ups on file.    Procedure(s):  Procedure Note:  Knee Injection bilateral    Consent   After discussing the various treatment options for the condition, It was agreed that a corticosteroid injection would be the next step in treatment. The nature of and the indications for a corticosteroid and / or local anaesthetic injection were reviewed in detail with the patient today. The inherent risks of injection including infection, bleeding, allergic reaction, increased pain, incomplete relief or temporary relief of symptoms, alterations of blood glucose levels requiring careful monitoring and treatment as indicated, tendon, ligament or articular cartilage rupture or degeneration, nerve injury, skin depigmentation, and/or fatty atrophy were discussed.   Procedure   After the risks and benefits of the procedure were explained,verbal consent was given, and a procedural time-out was performed. The knee joint was palpated and the correct anatomical landmarks were were used to identify the knee joint. The site for the injection was properly marked and prepped with Chlorhexadine solution.  The injection site was anesthetized with ethyl chloride. The knee joint was injected with 20 milligrams of Kenalog , ,and 2 cc of 0.5% Ropivicaine 3cc sterile saline using a sterile technique and a 22 gauge 1.5 inch needle. During the injection, there was unrestricted flow and care was taken not to inject corticosteroid into the skin or subcutaneous tissues. There were no complications during the procedure.      Post procedure a sterile band-aide was applied. Post-injection instructions were given regarding post-procedure care, when to follow up in clinic and what to expect from the procedure. The patient tolerated the injection well and was discharged without complication.        SUBJECTIVE     Chief Complaint:   Chief Complaint   Patient presents with    Right Knee - Pain    Left Knee - Pain       63 y.o. male     History of Present Illness  Nathaniel Gibson is a 63 year old male who presents with knee pain.    He has been experiencing ongoing knee pain, which he attributes to years of extensive walking as part of his work. The pain worsened about a week or two ago after attempting to walk. He has received injections in the past to manage the pain and is currently assessing his effectiveness.    He smokes and wants to quit smoking         Past Medical History: Past Medical History[1]      OBJECTIVE     Physical Exam:  Vitals:   Wt Readings from Last 3 Encounters:   07/17/23 (!) 125 kg (275 lb 9.6 oz)   07/14/23 (!) 122.9 kg (271 lb)   05/16/23 (!) 126.1 kg (278 lb)     Estimated body mass index is 35.38 kg/m?? as calculated from  the following:    Height as of 04/24/23: 188 cm (6' 2).    Weight as of 07/17/23: 125 kg (275 lb 9.6 oz).  Gen: Well-appearing male in no acute distress  MSK: Bilateral knees  Physical Exam  INSPECTION: No visible abnormalities. No overlying skin changes, ecchymosis, edema, gross deformity, or effusion.  PALPATION: Medial lateral joint line tenderness to palpation patellofemoral tenderness on the right  RANGE OF MOTION: Unchanged, range of motion pain with full flexion  STRENGTH: 5 out of 5 overall strength in all planes of movemen  SPECIAL TESTS: Negative provocative maneuvers.  NEUROVASCULAR: sensation in-tact to superficial and deep palpation. Neurovascuarly in-tact, less than 2 sec capillary refill      Imaging/other tests: No new imaging today  Results  Procedure: Intra-articular knee injection  Description: Injection administered to the knee    Orthopaedic PROMIS:  Failed to redirect to the Timeline version of the REVFS SmartLink.        06/15/2021 08/08/2021   [facility]   Lower Extremity Physical Function CAT Score 28 25   Pain Interference CAT Score 32 28       PRO Status:  Patient completed and provider reviewed PRO.      ADMINISTRATIVE     I have personally reviewed and interpreted the images (as available).  Point-of-care ultrasound imaging is on file and stored in a permanent location (if performed).  I have personally reviewed prior records and incorporated relevant information above (as available).    Note written with [employer].      MEDICAL DECISION MAKING (level of service defined by 2/3 elements)     Number/Complexity of Problems Addressed 1 or more chronic illnesses with exacerbation, progression, or side effects of treatment (99204/99214)   Amount/Complexity of Data to be Reviewed/Analyzed 2 points: Review prior notes (1 point per unique source); Review test results (1 point per unique test); Order tests (1 point per unique test) (99203/99213)   Risk of Complications/Morbidity/Mortality of Management Decision for MINOR Surgery (Including Injection) WITHOUT Risk Factors (99203/99213)     TIME     Total Time for E/M Services on the Date of Encounter Time-based coding not utilized for this encounter     CONSULTATION     Consultation services provided No     MODIFIER 25 (Significant, Separately Billable Evaluation and Management)     Documentation to ensure appropriate insurance payment for medically necessary work    Per the [employer] (rev. 05/06/2021) Chapter 13, Section B. Evaluation & Management (E&M) Services, paragraph 5:   ???In general, E&M services on the same date of service as the minor surgical procedure are included in the payment for the procedure???However, a significant and separately identifiable E&M service unrelated to the decision to perform the minor surgical procedure is separately reportable with modifier 25. The E&M service and minor surgical procedure do not require different diagnoses.???    Per the American Medical Association in Reporting CPT Modifier 25 [CPT??Geophysicist/field seismologist (Online). 2023;33(11):1-12.] Page 1, Appropriate Use, paragraph 1:   ???Modifier 25 is used to indicate that a patient's condition required a significant, separately identifiable evaluation and management (E/M) service above and beyond that associated with another procedure or service being reported by the same physician or other qualified health care professional [facility]) on the same date. This service must be above and beyond the other service provided or beyond the usual preoperative and postoperative care associated with the procedure or  service that was performed on that same date, and it must be substantiated by documentation in the patient's record that satisfies the relevant criteria for the respective E/M service to be reported.???    Per the American Medical Association in Reporting CPT Modifier 25 [CPT??Geophysicist/field seismologist (Online). 2023;33(11):1-12.] Page 2, Considerations, bullet point 2 (Requires awareness of usual preoperative and postoperative services):   ???Pre- and post-operative services typically associated with a procedure include the following and cannot be reported with a separate E/M services code:   Review of patient's relevant past medical history,  Assessment of the problem area to be treated by surgical or other service,  Formulation and explanation of the clinical diagnosis,  Review and explanation of the procedure to the patient, family, or caregiver,  Discussion of alternative treatments or diagnostic options,  Obtaining informed consent,  Providing postoperative care instructions,  Discussion of any further treatment and follow up after the procedure???    As the service provider for this encounter, I attest that the patient's condition required a significant, separately identifiable, medical necessary evaluation and management service in addition to the procedure performed on the same date of service. The evaluation and management service was above and beyond the usual preoperative and postoperative care associated with the procedure. The specific elements of the encounter that represent evaluation and management service above and beyond the usual preoperative and postoperative care associated with the procedure include, but are not limited to:  Reviewed the patient's relevant past surgical history, social history and family history  Reviewed the patient's medications  Reviewed the patient's allergies  Independently obtained history of present illness and relevant review of systems  Independently reviewed laboratory, imaging and/or other data  Independently synthesized history, physical examination, laboratory, imaging and/or other data to formulate a management plan including elements separate from the procedure itself and usual postprocedural care       PROCEDURES     Lg Joint Inj: bilateral knee on 10/28/2023 9:50 AM  Laterality: bilateral  Location: knee  Medications (Right): 20 mg triamcinolone  acetonide 10 mg/mL  Medications (Left): 20 mg triamcinolone  acetonide 10 mg/mL    Medical Care Team Attestation: All ProcDoc orders were read back and verbally confirmed with the procedure provider, including but not limited to patient name, medication name, dose, and route, before any actions were taken.  Provider Attestation: The information documented by members of my medical care team was reviewed and verified for accuracy by me.           DME     DME ORDER:  Dx:  ,                          [1]   Past Medical History:  Diagnosis Date    Acute gastric ulcer with hemorrhage 03/18/2022    Allergic rhinitis     Anemia 07/23/2017    Diabetes mellitus        Disease of thyroid gland     Effusion of lower leg joint     GERD (gastroesophageal reflux disease)     High cholesterol     HIV (human immunodeficiency virus infection)        Hypertension     Localized, primary osteoarthritis of lower leg     Obesity, unspecified     Organic sleep apnea, unspecified     Pain in joint, multiple sites     Pterygium, unspecified     Smoker     Snores  Unspecified gingival and periodontal disease

## 2023-10-29 ENCOUNTER — Ambulatory Visit: Admit: 2023-10-29 | Discharge: 2023-10-30 | Payer: Medicare (Managed Care)

## 2023-10-29 ENCOUNTER — Encounter
Admit: 2023-10-29 | Discharge: 2023-10-30 | Payer: Medicare (Managed Care) | Attending: Student in an Organized Health Care Education/Training Program | Primary: Student in an Organized Health Care Education/Training Program

## 2023-10-29 DIAGNOSIS — Z1151 Encounter for screening for human papillomavirus (HPV): Principal | ICD-10-CM

## 2023-10-29 DIAGNOSIS — E0844 Diabetes mellitus due to underlying condition with diabetic amyotrophy: Principal | ICD-10-CM

## 2023-10-29 DIAGNOSIS — Z113 Encounter for screening for infections with a predominantly sexual mode of transmission: Principal | ICD-10-CM

## 2023-10-29 DIAGNOSIS — B20 Human immunodeficiency virus [HIV] disease: Principal | ICD-10-CM

## 2023-10-29 DIAGNOSIS — Z1210 Encounter for screening for malignant neoplasm of intestinal tract, unspecified: Principal | ICD-10-CM

## 2023-10-29 DIAGNOSIS — Z9189 Other specified personal risk factors, not elsewhere classified: Principal | ICD-10-CM

## 2023-10-29 DIAGNOSIS — Z5181 Encounter for therapeutic drug level monitoring: Principal | ICD-10-CM

## 2023-10-29 DIAGNOSIS — Z79899 Other long term (current) drug therapy: Principal | ICD-10-CM

## 2023-10-29 LAB — BASIC METABOLIC PANEL
ANION GAP: 6 mmol/L (ref 5–14)
BLOOD UREA NITROGEN: 13 mg/dL (ref 9–23)
BUN / CREAT RATIO: 18
CALCIUM: 8.9 mg/dL (ref 8.7–10.4)
CHLORIDE: 107 mmol/L (ref 98–107)
CO2: 25.9 mmol/L (ref 20.0–31.0)
CREATININE: 0.71 mg/dL — ABNORMAL LOW (ref 0.73–1.18)
EGFR CKD-EPI (2021) MALE: >90 mL/min/{1.73_m2} (ref >=60)
GLUCOSE RANDOM: 185 mg/dL — ABNORMAL HIGH (ref 70–179)
POTASSIUM: 3.8 mmol/L (ref 3.4–4.8)
SODIUM: 139 mmol/L (ref 135–145)

## 2023-10-29 LAB — CBC W/ AUTO DIFF
BASOPHILS ABSOLUTE COUNT: 0 10*9/L (ref 0.0–0.1)
BASOPHILS RELATIVE PERCENT: 0.3 %
EOSINOPHILS ABSOLUTE COUNT: 0 10*9/L (ref 0.0–0.5)
EOSINOPHILS RELATIVE PERCENT: 0.2 %
HEMATOCRIT: 41.5 % (ref 39.0–48.0)
HEMOGLOBIN: 13.8 g/dL (ref 12.9–16.5)
LYMPHOCYTES ABSOLUTE COUNT: 2.4 10*9/L (ref 1.1–3.6)
LYMPHOCYTES RELATIVE PERCENT: 21.5 %
MEAN CORPUSCULAR HEMOGLOBIN CONC: 33.1 g/dL (ref 32.0–36.0)
MEAN CORPUSCULAR HEMOGLOBIN: 30.3 pg (ref 25.9–32.4)
MEAN CORPUSCULAR VOLUME: 91.6 fL (ref 77.6–95.7)
MEAN PLATELET VOLUME: 8.4 fL (ref 6.8–10.7)
MONOCYTES ABSOLUTE COUNT: 1.1 10*9/L — ABNORMAL HIGH (ref 0.3–0.8)
MONOCYTES RELATIVE PERCENT: 9.4 %
NEUTROPHILS ABSOLUTE COUNT: 7.7 10*9/L (ref 1.8–7.8)
NEUTROPHILS RELATIVE PERCENT: 68.6 %
PLATELET COUNT: 201 10*9/L (ref 150–450)
RED BLOOD CELL COUNT: 4.53 10*12/L (ref 4.26–5.60)
RED CELL DISTRIBUTION WIDTH: 14.7 % (ref 12.2–15.2)
WBC ADJUSTED: 11.3 10*9/L — ABNORMAL HIGH (ref 3.6–11.2)

## 2023-10-29 LAB — AST: AST (SGOT): 14 U/L (ref <=34)

## 2023-10-29 LAB — HEMOGLOBIN A1C
ESTIMATED AVERAGE GLUCOSE: 126 mg/dL
HEMOGLOBIN A1C: 6 % — ABNORMAL HIGH (ref 4.8–5.6)

## 2023-10-29 LAB — BILIRUBIN, TOTAL: BILIRUBIN TOTAL: 0.5 mg/dL (ref 0.3–1.2)

## 2023-10-29 LAB — ALT: ALT (SGPT): 15 U/L (ref 10–49)

## 2023-10-29 NOTE — Unmapped (Signed)
 It was great to see you today.    The ID clinic phone number is [phone].  The ID clinic fax number is [phone].    Please note that your laboratory and other results may be visible to you in real time, possibly before they reach your provider. Please allow 48 hours for clinical interpretation of these results. Importantly, even if a result is flagged as abnormal, it may not be one that impacts your health.    For urgent issues on nights and weekends you may reach the ID Physician on call through the [facility] Operator at [phone].     URGENT CARE  Please call ahead to speak with the nursing staff if you are in need of an urgent appointment.       MEDICATIONS  For refills please contact your pharmacy and ask them to electronically send or fax the request to the clinic.   Please bring all medications in original bottles to every appointment.    [facility] (formerly [facility]) or [employer] Eligibility (required even if you do not receive medication through [facility])  Please remember to renew your [employer] eligibility during renewal periods which occur twice a year: January-March and July-September.     The following are needed for each renewal:   - [place]  Identification (if you don't have one, then a bill with your name and address in [place] )   - proof of income (award letter, W-2, or last three check stubs)   If you are unable to come in for renewal, let us  know if we can mail, fax or e-mail paperwork to you.   [facility] Contact: [phone].     Lauraine FORBES Spurr, MD  [facility] at [facility]  [address]   [place], [place] [address]  Phone: [phone]   Fax: [phone]     Lab info:  Your most recent CD4 T-cell counts and viral loads are below. Here are a few things to keep in mind when looking at your numbers:  Our goal is to get your virus to be undetectable and keep it undetectable. If the virus is undetectable you are much more likely to stay healthy.  We consider your viral load to be undetectable if it says <40 or if it says Not detected.  For most people, we're checking CD4 counts every other visit (once or twice a year, or sometimes even less).  It's normal for your CD4 count to be different from visit to visit.   You can help by taking your medications at about the same time, every single day. If you're having trouble with taking your medications, it's important to let us  know.    Lab Results   Component Value Date    ACD4 1,484 04/24/2023    CD4 53 04/24/2023    HIVCP <20 (H) 04/24/2023    HIVRS Detected (A) 04/24/2023

## 2023-10-29 NOTE — Unmapped (Signed)
 Infectious Diseases Clinic        Assessment/Plan:     HIV  Overall doing well. He never misses doses of Biktarvy  (BIC/FTC/TAF). Still using pill box.    Accesses meds via through [facility].    CD4 counts have been over 300 for >2Y on suppressive ART.   CD4 rechecks can be annual.    Discussed ARV adherence.    Lab Results   Component Value Date    ACD4 1,484 04/24/2023    CD4 53 04/24/2023    HIVRS Detected (A) 04/24/2023    HIVCP <20 (H) 04/24/2023       Continue current therapy  CD4, HIV RNA, and safety labs (full return panel)  Discussed importance of ARV adherence.    Rectal bleeding (pain resolved) - no complaints today  - h/o anal warts  - 7/21 pap negative for intraepithelial lesion or malignancy, referred for anoscopy given persistence of symptoms and history of warts, bleeding recurred in interim.  - no anal sex, no pain, rectal GC/Chlamydia and RPR all negative previously  - 10/19/20 anoscopy Squamocolumnar junctional mucosa with no evidence of intraepithelial or invasive neoplasia   - 12/2021 anal pap ASCUS   - Repeat Anal pap 10/29/23     Knee/hip pain - chronic stable, no new complaints  Gets injections in knee, which help.     Weight fluctuations/obesity  PCP started ozempic  in Feb 2024, now up to 2 mg weekly. Denies abdominal pain, diarrhea. Has gained ~20 pounds since March that he thinks is from eating more fast food recently. 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 No change on exam today    HTN/ T2DM  BP elevated to 150s/80s in office. Has been taking lisinopril  20 mg daily. Reports excess salt intake which he feels is contributing  - defer medication change to PCP whom he will see next week    Teeth extraction  - has had all teeth extracted, had dentures fitted but unfortunately lost them so will need to re-purchase which will is frustrating for him    Depression and anxiety  Patient reports stable mood to me. Had previously expressed interest in [facility] psych but not clear if he was able to establish  - mood remains stable on quetiapine  and sertraline     Food insecurity - chronic  Retired Administrator, sports.   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 He started smoking about 49 years ago. He has a 49.5 pack-year smoking history. He has never used smokeless tobacco.  Still smoking 1 pack every 2 days. Counseled extensively on importance of quitting. Increased in setting of GodSon stress when he moved back in with patient. Not able to cut back at this point. Has tried nicotine  patches previously without success.     Sexual health & secondary prevention  Not sexually active, been 5+ years since sex  Since last visit, no sex and has not had add'l STI screening.   He does routinely discuss status with partner(s). Sometimes uses condoms.      Lab Results   Component Value Date    RPR Nonreactive 04/24/2023    CTNAA Negative 07/19/2020    CTNAA Negative 06/08/2019    CTNAA Negative 06/08/2019    CTNAA Negative 06/08/2019    GCNAA Negative 07/19/2020    GCNAA Negative 06/08/2019    GCNAA Negative 06/08/2019    GCNAA Negative 06/08/2019    SPECSOURCE Rectum 07/19/2020    SPECSOURCE Throat 06/08/2019    SPECSOURCE Urine 06/08/2019    SPECSOURCE Rectum 06/08/2019       GC/CT NAATs -- not being checked routinely for this patient  RPR -- not being checked routinely for this patient      Health maintenance    Wt Readings from Last 5 Encounters:   10/29/23 (!) 132 kg (291 lb)   07/17/23 (!) 125 kg (275 lb 9.6 oz)   07/14/23 (!) 122.9 kg (271 lb)   05/16/23 (!) 126.1 kg (278 lb)   04/24/23 (!) 128.2 kg (282 lb 9.6 oz)     Oral health  He does not have a dentist. Last dental exam in past 6 months.  Awaiting denture fit    Eye health  He does  use corrective lenses. Last eye exam in past 12 months.     Metabolic conditions  Lab Results   Component Value Date    CREATININE 0.90 09/30/2023    GLUCOSEU Negative 11/03/2018    A1C 6.1 (A) 03/13/2023     # Diabetes - managed through primary care  # Kidney health - no clinical indication for UA  # Bone health - managed through primary care    Communicable diseases  Lab Results   Component Value Date    QFTTBGOLD Negative 05/09/2017    HEPCAB Nonreactive 02/17/2020    HCVRNA Not Detected 01/10/2021     # TB screening - no longer needed; negative IGRA, low risk  # HCV screening - negative Ab; rescreen w/Ab q1-2y    Cancer screening  Lab Results   Component Value Date    PSASCRN 0.9 03/24/2012    PSA 0.92 02/17/2020    FINALDX  12/26/2021     A. Anal pap, cytology:  - Specimen satisfactory for evaluation including the presence of rectoglandular epithelium  - Atypical squamous cells of undetermined significance (ASC-US )    This electronic signature is attestation that the pathologist personally reviewed the submitted material(s) and the final diagnosis reflects that evaluation.       # Breast - not applicable  # Cervical - not applicable    # Anorectal - repeat 10/2023  # Colorectal -  colonoscopy and egd for melana on 07/17/2017 at OSH - findings w perinal/dre normal, sesile polyps in rectum. needs repeat surveillence in 74yrs (2024)  #  Liver - not applicable  # Lung - not yet done  # Prostate - not yet done; low risk    Cardiovascular disease  Lab Results   Component Value Date    CHOL 84 03/21/2022    HDL 32 (L) 03/21/2022    LDL 42 03/21/2022    NONHDL 52 (L) 03/21/2022    TRIG 52 03/21/2022     # The 10-year ASCVD risk score (Arnett DK, et al., 2019) is: 51.2%  - is not taking aspirin (  h/o GIB)  - is taking statin  - BP control good  - current smoker    Immunization History   Administered Date(s) Administered    COVID-19 VAC,BIVALENT,MODERNA(BLUE CAP) 07/30/2021    COVID-19 VACC,MRNA(BOOSTER)OR(6-58YR)MODERNA 10/19/2020    COVID-19 VACCINE,MRNA(MODERNA)(PF) 07/14/2019, 10/08/2019, 04/11/2020    Covid-19 Vac, (66yr+) (Comirnaty) Mrna Pfizer  04/24/2023    Covid-19 Vac, (72yr+) (Spikevax) Monovalent Moderna 03/18/2022    INFLUENZA VACCINE IIV3(IM)(PF)6 MOS UP 04/09/2023    Influenza Vaccine Quad(IM)6 MO-Adult(PF) 02/08/2013, 04/18/2015, 02/26/2016, 04/16/2017, 05/19/2018, 05/17/2019, 02/17/2020, 03/28/2021, 03/18/2022    Meningococcal Conjugate MCV4P 06/19/2020, 08/21/2020    PNEUMOCOCCAL POLYSACCHARIDE 23-VALENT 06/11/2013, 05/24/2017    Pneumococcal Conjugate 13-Valent 05/19/2018    SHINGRIX-ZOSTER VACCINE (HZV),RECOMBINANT,ADJUVANTED(IM) 02/17/2020, 06/19/2020    TdaP 08/03/2013    ZOSTAVAX - ZOSTER VACCINE, LIVE, SQ 07/08/2014       Lab Results   Component Value Date    HEPAIGG Reactive (A) 05/09/2017    HEPBSAB Reactive (A) 05/09/2017     Check MMR serologies    Screening ordered today: A1c  Immunizations ordered today: none    DAIGNOSES and ORDERS for today's visit:     Diagnosis ICD-10-CM Associated Orders   1. HIV disease     B20 HIV RNA, Quantitative, PCR      2. On highly active antiretroviral therapy (HAART)  Z79.899 HIV RNA, Quantitative, PCR     ALT     AST     Bilirubin, total     CBC w/ Differential     Basic Metabolic Panel      3. Therapeutic drug monitoring  Z51.81 HIV RNA, Quantitative, PCR      4. At risk for adverse drug reaction  Z91.89 ALT     AST     Bilirubin, total     CBC w/ Differential     Basic Metabolic Panel      5. Screen for sexually transmitted diseases  Z11.3       6. Special screening examination for human papillomavirus (HPV)  Z11.51 Pap Test      7. Encounter for special screening examination for neoplasm of intestinal tract  Z12.10 Hemoglobin A1c     Pap Test      8. 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  All documented time was specific to the E/M visit and does not include any procedures that may have been performed.    Pt seen and discussed with Dr. Tobie, PGY1    Disposition    Return to clinic 5-6 months or sooner if needed.    To do @ next RTC    Weight loss  [facility] access  Smoking cessation  COPD control - consider referral to pulm                  Chief Complaint   Follow-up HIV    HPI  In addition to details in A&P above:  Nathaniel Gibson is a 63 y.o. male who presents for HIV care. Newly Dx in late 2018; CD4 629/31% at time of Dx.    Has complex medical history - see notes by problem above.     HIV:   - no issues  taking biktarvy . No missed doses. Using pill box.     Pain:  - persistent pain in hips and knees. Last got injections in both knees yesterday with Ortho. Will get hip injections     Weight Gain:  - has gained about 20 pounds since March. Thinks because has been eating out with his grandchildren more. His PCP recently increased ozempic  to 2 mg weekly on Saturday.     Substance use  - smoking a pack every 2 days. Has tried to stop in the past. Interested in quitting but has been challenging. Not intersted in starting other medicines.     HTN:   - does not have a blood pressure cuff at home. Says he can check when he picks up Biktarvy  at [facility]. Took his lisinopril  this morning.     God son is back in his life which has been great for his overall. He is graduating from college. They are planning to go to [place]  next week.     Says mood is pretty good. Thinks he has been taking sertraline .     Since last visit, presented to the ER 07/14/23 for COPD exacerbation and on 09/30/23 for diarrheal illness, both have since resolved.         Past Medical History:   Diagnosis Date    Acute gastric ulcer with hemorrhage 03/18/2022    Allergic rhinitis     Anemia 07/23/2017    Diabetes mellitus        Disease of thyroid gland     Effusion of lower leg joint     GERD (gastroesophageal reflux disease)     High cholesterol     HIV (human immunodeficiency virus infection)        Hypertension     Localized, primary osteoarthritis of lower leg     Obesity, unspecified     Organic sleep apnea, unspecified     Pain in joint, multiple sites     Pterygium, unspecified     Smoker     Snores     Unspecified gingival and periodontal disease          Social History  Housing - in house in house with [employer] / Work - Not in school.  previously worked as Administrator, sports    Tobacco - He reports that he has been smoking cigarettes. He started smoking about 49 years ago. He has a 49.5 pack-year smoking history. He has never used smokeless tobacco.   Drinking ever few weeks    Substance use - active marijuana       Review of Systems  As per HPI. All others negative.      Medications and Allergies  He has a current medication list which includes the following prescription(s): acetaminophen , acetaminophen , albuterol , amoxicillin , atorvastatin , biktarvy , biktarvy , on call express test strip, blood-glucose meter, blood-glucose meter, chlorhexidine , clonidine  hcl, cyclobenzaprine , dexamethasone , doxycycline , fluticasone  propionate, ibuprofen , lancing device, lansoprazole , lidocaine , lidocaine , lidocaine , lisinopril , metformin , multivitamin, nicotine  polacrilex, polyethylene glycol, quetiapine , semaglutide , ozempic , sertraline , tiotropium, and tiotropium, and the following Facility-Administered Medications: triamcinolone  acetonide and triamcinolone  acetonide.    Allergies: Naprosyn  [naproxen ]      Family History  His family history includes Cancer in his mother; Diabetes in his father and mother; Heart disease in his father; Hypertension in his mother; Kidney disease in his mother; No Known Problems in his brother, maternal aunt, maternal grandfather, maternal grandmother, maternal uncle, paternal aunt, paternal grandfather, paternal grandmother, paternal uncle, sister, and another family member.  BP 159/91 (BP Site: L Arm, BP Position: Sitting, BP Cuff Size: Large) Comment: standing 158/91 - Pulse 74  - Temp 37 ??C (98.6 ??F) (Oral)  - Wt (!) 132 kg (291 lb)  - BMI 37.36 kg/m??      Alert appearing obese gentleman NAD, engaged, engages throughout our discussion  Sclerae anicteric, fleshy overgrowth appreciated R sclera and associated scleral injection, stable  Edentulous. Hyperpigmented lesion on lower lip <1cm, stable  External rectal exam wnl,   No obvious rashes  AOx3  Normal affect, good eye contact, fluent speech          [id number]

## 2023-10-29 NOTE — Unmapped (Signed)
 Spoke with patient in clinic to schedule a phone appointment for medication renewal. Patient's phone appointment is scheduled for 11/28/2023 at 9am.    Patient would like a text message before phone appt to know to answer.    Madinah Cathcart-Rowe  Benefits Counselor  Time of Intervention: 5 mins

## 2023-10-30 LAB — HIV RNA, QUANTITATIVE, PCR: HIV RNA QNT RSLT: NOT DETECTED

## 2023-10-30 NOTE — Unmapped (Signed)
 Referral Services Note     Duration of Intervention: 30 minutes    TYPE OF CONTACT: Face to Face - In Person    ASSESSMENT: Patient stated he was overall doing well. Patient stated he was in [place] for 3 months and just recently returned with his Godson. Patient stated he has plans on moving there permanently in the next couple of months. Patient stated he has no presenting concerns at this time.     INTERVENTION:  SW provided active listening, validation, empathic feedback, unconditional positive regard, and congruence within the relationship.    PLAN:  SW will continue to support patient as needed.      Benny Henrie, LCSWA  [facility] [place] [facility]

## 2023-11-05 DIAGNOSIS — M25551 Pain in right hip: Principal | ICD-10-CM

## 2023-11-05 DIAGNOSIS — G8929 Other chronic pain: Principal | ICD-10-CM

## 2023-11-05 MED ORDER — TRAMADOL 50 MG TABLET
ORAL_TABLET | Freq: Two times a day (BID) | ORAL | 0 refills | 23.00000 days | Status: CP | PRN
Start: 2023-11-05 — End: 2023-12-04

## 2023-11-05 NOTE — Unmapped (Signed)
 [facility] Refill Coordination Note    Specialty Medication(s) to be Shipped:   BIKTARVY  50-200-25 mg tablet (bictegrav-emtricit-tenofov ala)    Other medication(s) to be shipped: Tramadol      Nathaniel Gibson, DOB: 1961-05-01  Phone: There are no phone numbers on file.      All above HIPAA information was verified with patient.     Was a Nurse, learning disability used for this call? No    Completed refill call assessment today to schedule patient's medication shipment from the [facility]  [phone]).  All relevant notes have been reviewed.     Specialty medication(s) and dose(s) confirmed: Regimen is correct and unchanged.   Changes to medications: Nathaniel Gibson reports no changes at this time.  Changes to insurance: No  New side effects reported not previously addressed with a pharmacist or physician: None reported  Questions for the pharmacist: No    Confirmed patient received a Conservation officer, historic buildings and a Surveyor, mining with first shipment. The patient will receive a drug information handout for each medication shipped and additional FDA Medication Guides as required.       DISEASE/MEDICATION-SPECIFIC INFORMATION        N/A    SPECIALTY MEDICATION ADHERENCE     Medication Adherence    Patient reported X missed doses in the last month: 0  Specialty Medication: BIKTARVY  50-200-25 mg tablet (bictegrav-emtricit-tenofov ala)  Patient is on additional specialty medications: No              Were doses missed due to medication being on hold? No    BIKTARVY  50-200-25 mg tablet (bictegrav-emtricit-tenofov ala): 8 days of medicine on hand    REFERRAL TO PHARMACIST     Referral to the pharmacist: Not needed      [facility]     Shipping address confirmed in Epic.     Cost and Payment: Patient has a $0 copay, payment information is not required.    Delivery Scheduled: Yes, Expected medication delivery date: 11/11/2023.     Medication will be delivered via Next Day Courier to the prescription address in Epic WAM.    [name]   [facility]  Specialty Technician

## 2023-11-06 ENCOUNTER — Encounter: Admit: 2023-11-06 | Discharge: 2023-11-06 | Payer: Medicare (Managed Care)

## 2023-11-06 DIAGNOSIS — E669 Obesity, unspecified: Principal | ICD-10-CM

## 2023-11-06 DIAGNOSIS — M25552 Pain in left hip: Principal | ICD-10-CM

## 2023-11-06 DIAGNOSIS — M25562 Pain in left knee: Principal | ICD-10-CM

## 2023-11-06 DIAGNOSIS — G8929 Other chronic pain: Principal | ICD-10-CM

## 2023-11-06 DIAGNOSIS — E1169 Type 2 diabetes mellitus with other specified complication: Principal | ICD-10-CM

## 2023-11-06 LAB — ALBUMIN / CREATININE URINE RATIO
ALBUMIN QUANT URINE: 0.5 mg/dL
ALBUMIN/CREATININE RATIO: 2.7 ug/mg (ref 0.0–30.0)
CREATININE, URINE: 184.2 mg/dL

## 2023-11-06 MED ORDER — TRAMADOL 50 MG TABLET
ORAL_TABLET | Freq: Four times a day (QID) | ORAL | 1 refills | 15.00000 days | Status: CP | PRN
Start: 2023-11-06 — End: 2023-12-06
  Filled 2023-11-10: qty 90, 23d supply, fill #0

## 2023-11-06 NOTE — Unmapped (Signed)
 Assessment and Plan:     Assessment & Plan  Left Hip and Knee Pain  Significant left hip and knee pain, likely arthritis exacerbation. Current pain management insufficient. Risks include potential liver damage from excessive medication use.  - Order x-ray of left hip.  - Adjust tramadol  dosage to two tablets every six hours, up to eight tablets per day.  - Refer to pain specialist for further evaluation and management.  - Continue follow-up with orthopedist for joint injections.  - Consider use of a cane for mobility support.    Medication Management  Taking tramadol  and Tylenol  arthritis for pain. Previously exceeded recommended tramadol  dosage. Advised on proper dosing to avoid adverse effects.  - Educate on proper tramadol  dosing: two tablets every six hours, not exceeding eight tablets per day.  - Cancel previous tramadol  prescription and send updated prescription to pharmacy.    Vision Care  Inquired about eyeglasses and advised to check insurance for vision coverage and network providers.  - Advise to verify vision coverage with insurance and seek optometrist services for eyeglasses.    Follow-up  Plans to move to [facility] in November and will need to establish care with a new provider.  - Schedule follow-up appointment in early October.  - Assist in finding a new healthcare provider in [place] if needed.         PHQ-2 Score:      PHQ-9 Score: 1    Edinburgh Score:      Screening complete, no depression identified / no further action needed today      I personally spent 30 minutes face-to-face and non-face-to-face in the care of this patient, which includes all pre, intra, and post visit time (reviewing, evaluating and discussing pertinent records for patient's care) on the date of service.       Return in about 3 months (around 02/20/2024) for 40 minute follow up cdm.    Subjective:     HPI: Nathaniel Gibson is a 63 y.o. male here for        History of Present Illness  Nathaniel Gibson is a 63 year old male with arthritis who presents for a regular follow-up due to increased pain in his left hip and knee.    He has experienced significant pain in his left hip and knee, worsening over the past three weeks. The pain is particularly severe when lying down, causing stiffness in the hip. He mentions a previous fall and expresses a need for a cane due to the pain.    He has a history of arthritis, with an x-ray from four years ago showing arthritis in the left knee. He has been taking tramadol , three tablets twice a day, along with two Tylenol  Arthritis tablets, but the pain persists.    He has been receiving injections for his arthritis. He received a knee injection last week and is scheduled for a hip injection. The knee injection has provided some relief, but he describes a sensation of the knee 'shifting'.    He plans to start walking for exercise once the weather cools down, as he has a track available across the street from his residence. He also plans to move to [place] in November, where he previously lived and felt better.         ROS:   Review of Systems     Review of systems negative unless otherwise noted as per HPI.      The following portions of the patient's history  were reviewed and updated as appropriate: allergies, current medications, past family history, past medical history, past social history, past surgical history and problem list.     Objective:     Vitals:    11/06/23 0803   BP: 122/83   Pulse: 66   Temp: 36.7 ??C (98 ??F)     Body mass index is 36.64 kg/m??.    Physical Exam  Vitals and nursing note reviewed.   Constitutional:       General: He is not in acute distress.     Appearance: Normal appearance. He is not ill-appearing, toxic-appearing or diaphoretic.     Cardiovascular:      Rate and Rhythm: Normal rate and regular rhythm.      Heart sounds: Normal heart sounds.   Pulmonary:      Effort: Pulmonary effort is normal. No respiratory distress.      Breath sounds: Normal breath sounds. Musculoskeletal:      Cervical back: Neck supple.     Neurological:      Mental Status: He is alert.                Results  RADIOLOGY  Left knee X-ray: Evidence of arthritis         Allergies:     Naprosyn  [naproxen ]      THEODORO JUST, MD    [facility]:     Medication adherence and barriers to the treatment plan have been addressed. Opportunities to optimize healthy behaviors have been discussed. Patient / caregiver voiced understanding.

## 2023-11-10 MED FILL — BIKTARVY 50 MG-200 MG-25 MG TABLET: ORAL | 30 days supply | Qty: 30 | Fill #4

## 2023-11-25 DIAGNOSIS — G8929 Other chronic pain: Principal | ICD-10-CM

## 2023-11-25 DIAGNOSIS — M25562 Pain in left knee: Principal | ICD-10-CM

## 2023-11-25 DIAGNOSIS — M25552 Pain in left hip: Principal | ICD-10-CM

## 2023-11-28 NOTE — Unmapped (Signed)
 Name: Nathaniel Gibson  Date: 11/28/2023  Address: [address]  [place] [address]   [place] of Residence:  [facility]  Phone: [phone]     Started assessment with patient options: phone call    Is this the same address for mailing? Yes  If no, Mailing Address is:     What is your preferred method of contact? Phone Call    Is there anyone that you would want to add as your personal contact? No; if yes, please use SmartPhrase RWPersonalContactInfo to gather their contacts information.    N/A    Housing Status  Temporary; if so, what is their housing type: Temporary arrangement to stay or live with family or friends    Insurance  Medicare Part C    Marital Status  Single    Tax Press photographer Status  I did not file taxes     Employment Status  Medically Unable to Work    Income  Tree surgeon (Retirement/Survivor's/Disability)    If no or low income, how are you meeting your basic needs?  Not Applicable    List Tax Household Members including relationship to you:   N/A    Someone in my household receives: Not Applicable (for household members)  Specify who: N/A    Do you or anyone in your home have income adjustments? No    If yes, which adjustments do you have? N/A      Medication Access/Barriers: none, hmap    Do you have a current diagnosis for Hepatitis C?  Lab Results   Component Value Date    HEPCAB Nonreactive 02/17/2020    HCVRNA Not Detected 01/10/2021       Federal Marketplace Eligibility Assessment  Patient has affordable insurance through [employer], [place], and or Employment and is not eligible.    Patient given ACA education if they qualified based on answers to questions above.     MyChart  Do you have an active MyChart account? Yes     If MyChart is not set up, informed patient on how to set up MyChart N/A    Patient was informed of the following programs;   N/A    The following applications/handouts were given to patient:   N/A    The following forms were also started with the patient: N/A    Medicaid:  N/A      [place]/HMAP application status: Incomplete; patient needs to send proof of income (ssi letter)    Patient is applying for [facility]- SPAP (Medicare drug copay assistance)    Additional Comments: Sent email for proof of income.    ____________________________________________________________________    The patient provides verbal consent and agrees to the following:    RW Consent  I agree to notify the interviewer within 30 days about any changes to my address, financial resources, expenses, family situation, or health insurance coverage that may impact my eligibility for Department payment programs. I certify that the information I have provided is accurate and complete to the best of my knowledge. I understand that information provided may be verified by a state reviewer, and I agree to submit any necessary financial records required for this review. I also understand that my employer may be asked to verify information concerning my income.    Caps on Charges Consent  The [employer] Program requires that both insured and uninsured individuals be charged no more than a specified maximum amount in a calendar year, based on their individual income.  Bartholome Polite,  Benefits & Eligibility Coordinator  Time of Intervention: 7 minutes

## 2023-12-01 NOTE — Unmapped (Signed)
 [facility] Specialty and Home Delivery Pharmacy Refill Coordination Note    Specialty Medication(s) to be Shipped:   Infectious Disease: Biktarvy     Other medication(s) to be shipped: albuterol  inhaler, atorvastatin , clonidine , lansoprazole , lidocaine  patches, metformin , tramadol , lisinopril      Nathaniel Gibson, DOB: 10/27/1960  Phone: There are no phone numbers on file.      All above HIPAA information was verified with patient.     Was a Nurse, learning disability used for this call? No    Completed refill call assessment today to schedule patient's medication shipment from the [facility] and Home Delivery Pharmacy  [phone]).  All relevant notes have been reviewed.     Specialty medication(s) and dose(s) confirmed: Regimen is correct and unchanged.   Changes to medications: Kylar reports no changes at this time.  Changes to insurance: No  New side effects reported not previously addressed with a pharmacist or physician: None reported  Questions for the pharmacist: No    Confirmed patient received a Conservation officer, historic buildings and a Surveyor, mining with first shipment. The patient will receive a drug information handout for each medication shipped and additional FDA Medication Guides as required.       DISEASE/MEDICATION-SPECIFIC INFORMATION        N/A    SPECIALTY MEDICATION ADHERENCE     Medication Adherence    Patient reported X missed doses in the last month: 0  Specialty Medication: bictegrav-emtricit-tenofov ala: BIKTARVY  50-200-25 mg tablet  Patient is on additional specialty medications: No  Patient is on more than two specialty medications: No  Any gaps in refill history greater than 2 weeks in the last 3 months: no  Demonstrates understanding of importance of adherence: yes  Informant: patient  Confirmed plan for next specialty medication refill: delivery by pharmacy  Refills needed for supportive medications: not needed          Refill Coordination    Has the Patients' Contact Information Changed: No  Is the Shipping Address Different: No         Were doses missed due to medication being on hold? No    bictegrav-emtricit-tenofov ala: BIKTARVY  50-200-25  mg: 6 days of medicine on hand       REFERRAL TO PHARMACIST     Referral to the pharmacist: Not needed      [facility]     Shipping address confirmed in Epic.     Cost and Payment: Patient has a $0 copay, payment information is not required.    Delivery Scheduled: Yes, Expected medication delivery date: 12/03/23.     Medication will be delivered via Next Day Courier to the prescription address in Epic [place].    Suzen Blood   [facility] Specialty and Home Delivery Pharmacy  Specialty Technician

## 2023-12-02 MED FILL — TRAMADOL 50 MG TABLET: ORAL | 15 days supply | Qty: 120 | Fill #0

## 2023-12-02 MED FILL — BIKTARVY 50 MG-200 MG-25 MG TABLET: ORAL | 30 days supply | Qty: 30 | Fill #5

## 2023-12-02 MED FILL — CLONIDINE HCL 0.1 MG TABLET: ORAL | 90 days supply | Qty: 90 | Fill #3

## 2023-12-02 MED FILL — LISINOPRIL 20 MG TABLET: ORAL | 90 days supply | Qty: 90 | Fill #3

## 2023-12-02 MED FILL — ATORVASTATIN 40 MG TABLET: ORAL | 90 days supply | Qty: 90 | Fill #3

## 2023-12-02 MED FILL — ALBUTEROL SULFATE HFA 90 MCG/ACTUATION AEROSOL INHALER: RESPIRATORY_TRACT | 34 days supply | Qty: 17 | Fill #2

## 2023-12-02 MED FILL — LANSOPRAZOLE 30 MG CAPSULE,DELAYED RELEASE: ORAL | 90 days supply | Qty: 90 | Fill #2

## 2023-12-02 MED FILL — METFORMIN 500 MG TABLET: ORAL | 90 days supply | Qty: 180 | Fill #2

## 2023-12-02 MED FILL — LIDOCAINE 5 % TOPICAL PATCH: TRANSDERMAL | 30 days supply | Qty: 30 | Fill #1

## 2023-12-12 ENCOUNTER — Ambulatory Visit
Admit: 2023-12-12 | Discharge: 2023-12-13 | Payer: Medicare (Managed Care) | Attending: Emergency Medicine | Primary: Emergency Medicine

## 2023-12-12 DIAGNOSIS — M170 Bilateral primary osteoarthritis of knee: Principal | ICD-10-CM

## 2023-12-12 DIAGNOSIS — M160 Bilateral primary osteoarthritis of hip: Principal | ICD-10-CM

## 2023-12-12 MED ADMIN — triamcinolone acetonide (KENALOG) injection 20 mg: 20 mg | INTRA_ARTICULAR | @ 12:00:00 | Stop: 2023-12-12

## 2023-12-12 NOTE — Unmapped (Signed)
 Plan:Intra-articular Knee Injection for Knee arthritis    You had a steroid injection today for your knee osteoarthritis. The steroid we use is called Kenalog.     You received a corticosteroid injection to reduce pain and inflammation.  Please note that it can take up to 2 weeks for this injection to fully work.  While many people will feel relief sooner, please be patient.      The injection contained a corticosteroid and a numbing agent.  The numbing agent can last for 1-6 hours.  After this wears off you may have increased pain until the steroid has a chance to work.    What are some of the possible side effects of a steroid injection?    Common side effects:  temporarily elevated blood sugar (in diabetic patients) that can last a few days   flushing of the skin, especially the face  temporary rise in blood pressure  discoloration or atrophy of the skin at the injection site    Call your doctor at once if you have:  persistent worsening pain or swelling, fever;  blurred vision, tunnel vision, eye pain, or seeing halos around lights;  fast or slow heartbeats;  increased blood pressure that is associated with severe headache, blurred vision, pounding in your neck or ears, anxiety, nosebleed;  headaches, ringing in your ears, dizziness, nausea, vision problems, pain behind your eyes    This is not a complete list of side effects and others may occur. Call your doctor for medical advice about side effects.     What other drugs may be affected after the injection?  Many drugs can interact with steroids. Not all possible interactions are listed here. Tell your doctor about all your current medicines and any you start or stop using, especially:  an antibiotic or antifungal medication;  birth control pills or hormone replacement therapy;  a blood thinner (warfarin, Coumadin, and others);  a diuretic or water pill;  insulin or oral diabetes medicine;  medicine to treat tuberculosis;  a nonsteroidal anti-inflammatory drug or NSAID (aspirin, ibuprofen, naproxen, diclofenac, indomethacin, Advil, Aleve, Celebrex, and many others); or  seizure medication.      You can resume your normal daily activities, but you should not seek out any lower body exercise for the next few days to give your body a chance to let the steroids take effect.     After a 2-3 days, you can begin your physical therapy exercises to strengthen your hip and knees. Getting on a stationary bike and light spinning is a great way to keep your knee from getting stiff. The injection may start to help with the pain in a few days to a couple of weeks. You can take Tylenol as needed for pain before the steroids begin to work. You should not soak in a bath or get in a pool for 24 hours following an injection.        Thank you for coming to [facility] and our clinic today!     We aim to provide you with the highest quality, individualized care.  If you have any unanswered questions after the visit, please do not hesitate to reach out to [place] on MyChart or leave a message for the nurse.  ?  MyChart messages: These messages can be sent to your provider and will be checked by their clinical support staff.? The messages are checked throughout the day during normal business hours from 8:30 am-4:00 pm Monday-Friday, however responses  may take up to 48 hours.? Please use this method of communication for non-urgent and non-emergent concerns, questions, refill requests or inquiries only.? ?Our team will help respond to all of your questions.? Please note that you may be asked to see a provider by either a telehealth or in person visit if it is deemed your questions are best handled in the clinic setting in person.??  ?  Please keep in mind, these messages are not real time communications, so be patient when waiting for a response.    If you do not have access to MyChart, do not know how to use MyChart or have an issue that may require more extensive discussion, please call the nurses' call line: [phone].? This line is checked throughout the day and will be responded to as time allows.? Please note that return calls could take up to 48 hours, depending on the nature of the need.?  ?  If you have an issue that requires emergent attention that cannot wait; either call the Orthopaedics resident on call at [phone], consider coming to our [facility] walk-in clinic, or go to the nearest Emergency Department.    If you need to schedule future appointments, please call [phone].     We look forward to seeing you again in the future and appreciate you choosing [facility] for your care!    Thank you,                We provide innovative and comprehensive patient centered care that is supported by evidence-based research                                                                                                    RESEARCH PARTICIPATION    Please check out our current research studies to see if you or someone you know may qualify at:    [email or web address]

## 2023-12-12 NOTE — Unmapped (Signed)
 SPORTS MEDICINE RETURN VISIT    ASSESSMENT AND PLAN      Diagnosis ICD-10-CM Associated Orders   1. Primary osteoarthritis of knees, bilateral  M17.0       2. Primary osteoarthritis of hips, bilateral  M16.0                  Assessment & Plan  Primary osteoarthritis of knee, bilateral  Chronic bilateral hip osteoarthritis. Emphasized joint mobility through walking. Recent injections for pain management. Explained activity's role in symptom management.  - Encourage regular walking.  - Consider pain clinic referral if pain persists.    Nicotine  dependence  Ongoing nicotine  dependence. Acknowledged health risks and intention to quit.  - Advise smoking cessation.    No follow-ups on file.    Procedure(s):  Procedure Note:  Knee Injection bilateral    Consent   After discussing the various treatment options for the condition, It was agreed that a corticosteroid injection would be the next step in treatment. The nature of and the indications for a corticosteroid and / or local anaesthetic injection were reviewed in detail with the patient today. The inherent risks of injection including infection, bleeding, allergic reaction, increased pain, incomplete relief or temporary relief of symptoms, alterations of blood glucose levels requiring careful monitoring and treatment as indicated, tendon, ligament or articular cartilage rupture or degeneration, nerve injury, skin depigmentation, and/or fatty atrophy were discussed.   Procedure   After the risks and benefits of the procedure were explained,verbal consent was given, and a procedural time-out was performed. The knee joint was palpated and the correct anatomical landmarks were were used to identify the knee joint. The site for the injection was properly marked and prepped with Chlorhexadine solution.  The injection site was anesthetized with ethyl chloride. The knee joint was injected with 20 milligrams of Kenalog , ,and 2 cc of 0.5% Ropivicaine 3cc sterile saline using a sterile technique and a 22 gauge 1.5 inch needle. During the injection, there was unrestricted flow and care was taken not to inject corticosteroid into the skin or subcutaneous tissues. There were no complications during the procedure.      Post procedure a sterile band-aide was applied. Post-injection instructions were given regarding post-procedure care, when to follow up in clinic and what to expect from the procedure. The patient tolerated the injection well and was discharged without complication.        SUBJECTIVE     Chief Complaint:   Chief Complaint   Patient presents with    Right Knee - Injection(s)     Not here for hips today is here for knee   Last injections for the knees were 6 months ago - lasted for about 3 months   Increased pain the past couple days - was using lido patches for the pain and was helping       Left Knee - Injection(s)     Of note last visit was for hips not knees     63 y.o. male     History of Present Illness  Nathaniel Gibson is a 63 year old male who presents with knee pain.    He has been experiencing ongoing knee pain, which he attributes to years of extensive walking as part of his work. The pain worsened about a week or two ago after attempting to walk. He has received injections in the past to manage the pain and is currently assessing his effectiveness.    He smokes and wants  to quit smoking         Past Medical History: Past Medical History[1]      OBJECTIVE     Physical Exam:  Vitals:   Wt Readings from Last 3 Encounters:   11/06/23 (!) 129.5 kg (285 lb 6.4 oz)   10/29/23 (!) 132 kg (291 lb)   07/17/23 (!) 125 kg (275 lb 9.6 oz)     Estimated body mass index is 36.64 kg/m?? as calculated from the following:    Height as of 04/24/23: 188 cm (6' 2).    Weight as of 11/06/23: 129.5 kg (285 lb 6.4 oz).  Gen: Well-appearing male in no acute distress  MSK: Bilateral knees  Physical Exam  INSPECTION: No visible abnormalities. No overlying skin changes, ecchymosis, edema, gross deformity, or effusion.  PALPATION: Medial lateral joint line tenderness to palpation patellofemoral tenderness on the right  RANGE OF MOTION: Unchanged, range of motion pain with full flexion  STRENGTH: 5 out of 5 overall strength in all planes of movemen  SPECIAL TESTS: Negative provocative maneuvers.  NEUROVASCULAR: sensation in-tact to superficial and deep palpation. Neurovascuarly in-tact, less than 2 sec capillary refill      Imaging/other tests: No new imaging today  Results  Procedure: Intra-articular knee injection  Description: Injection administered to the knee    Orthopaedic PROMIS:  Failed to redirect to the Timeline version of the REVFS SmartLink.        06/15/2021 08/08/2021   [facility]   Lower Extremity Physical Function CAT Score 28 25   Pain Interference CAT Score 32 28       PRO Status:  Patient completed and provider reviewed PRO.      ADMINISTRATIVE     I have personally reviewed and interpreted the images (as available).  Point-of-care ultrasound imaging is on file and stored in a permanent location (if performed).  I have personally reviewed prior records and incorporated relevant information above (as available).    Note written with [employer].      MEDICAL DECISION MAKING (level of service defined by 2/3 elements)     Number/Complexity of Problems Addressed 1 or more chronic illnesses with exacerbation, progression, or side effects of treatment (99204/99214)   Amount/Complexity of Data to be Reviewed/Analyzed 2 points: Review prior notes (1 point per unique source); Review test results (1 point per unique test); Order tests (1 point per unique test) (99203/99213)   Risk of Complications/Morbidity/Mortality of Management Decision for MINOR Surgery (Including Injection) WITHOUT Risk Factors (99203/99213)     TIME     Total Time for E/M Services on the Date of Encounter Time-based coding not utilized for this encounter     CONSULTATION     Consultation services provided No MODIFIER 25 (Significant, Separately Billable Evaluation and Management)     Documentation to ensure appropriate insurance payment for medically necessary work    Per the [employer] (rev. 05/06/2021) Chapter 13, Section B. Evaluation & Management (E&M) Services, paragraph 5:   ???In general, E&M services on the same date of service as the minor surgical procedure are included in the payment for the procedure???However, a significant and separately identifiable E&M service unrelated to the decision to perform the minor surgical procedure is separately reportable with modifier 25. The E&M service and minor surgical procedure do not require different diagnoses.???    Per the American Medical Association in Reporting CPT Modifier 25 [CPT??Geophysicist/field seismologist (Online). 2023;33(11):1-12.] Page 1,  Appropriate Use, paragraph 1:   ???Modifier 25 is used to indicate that a patient's condition required a significant, separately identifiable evaluation and management (E/M) service above and beyond that associated with another procedure or service being reported by the same physician or other qualified health care professional [facility]) on the same date. This service must be above and beyond the other service provided or beyond the usual preoperative and postoperative care associated with the procedure or service that was performed on that same date, and it must be substantiated by documentation in the patient's record that satisfies the relevant criteria for the respective E/M service to be reported.???    Per the American Medical Association in Reporting CPT Modifier 25 [CPT??Geophysicist/field seismologist (Online). 2023;33(11):1-12.] Page 2, Considerations, bullet point 2 (Requires awareness of usual preoperative and postoperative services):   ???Pre- and post-operative services typically associated with a procedure include the following and cannot be reported with a separate E/M services code:   Review of patient's relevant past medical history,  Assessment of the problem area to be treated by surgical or other service,  Formulation and explanation of the clinical diagnosis,  Review and explanation of the procedure to the patient, family, or caregiver,  Discussion of alternative treatments or diagnostic options,  Obtaining informed consent,  Providing postoperative care instructions,  Discussion of any further treatment and follow up after the procedure???    As the service provider for this encounter, I attest that the patient's condition required a significant, separately identifiable, medical necessary evaluation and management service in addition to the procedure performed on the same date of service. The evaluation and management service was above and beyond the usual preoperative and postoperative care associated with the procedure. The specific elements of the encounter that represent evaluation and management service above and beyond the usual preoperative and postoperative care associated with the procedure include, but are not limited to:  Reviewed the patient's relevant past surgical history, social history and family history  Reviewed the patient's medications  Reviewed the patient's allergies  Independently obtained history of present illness and relevant review of systems  Independently reviewed laboratory, imaging and/or other data  Independently synthesized history, physical examination, laboratory, imaging and/or other data to formulate a management plan including elements separate from the procedure itself and usual postprocedural care       PROCEDURES     Lg Joint Inj: bilateral knee on 12/12/2023 8:10 AM  Laterality: bilateral  Location: knee  Medications (Right): 20 mg triamcinolone  acetonide 10 mg/mL  Medications (Left): 20 mg triamcinolone  acetonide 10 mg/mL    Medical Care Team Attestation: All ProcDoc orders were read back and verbally confirmed with the procedure provider, including but not limited to patient name, medication name, dose, and route, before any actions were taken.  Provider Attestation: The information documented by members of my medical care team was reviewed and verified for accuracy by me.           DME     DME ORDER:  Dx:  ,                            [1]   Past Medical History:  Diagnosis Date    Acute gastric ulcer with hemorrhage 03/18/2022    Allergic rhinitis     Anemia 07/23/2017    Diabetes mellitus        Disease of thyroid gland     Effusion of lower leg joint  GERD (gastroesophageal reflux disease)     High cholesterol     HIV (human immunodeficiency virus infection)        Hypertension     Localized, primary osteoarthritis of lower leg     Obesity, unspecified     Organic sleep apnea, unspecified     Pain in joint, multiple sites     Pterygium, unspecified     Smoker     Snores     Unspecified gingival and periodontal disease

## 2023-12-15 NOTE — Unmapped (Signed)
 Copied from CRM #[id number]. Topic: Access To Clinicians - Req Clinic Call Back  >> Dec 15, 2023  1:45 PM Ardena MATSU wrote:  Clinical Advice: They declined to provide further details.        The patient preferred contact: Cell Phone Telephone Information:  Mobile [phone])   Urgent callback turnaround time: within 24 business hours. Programmer, systems Notified)

## 2023-12-25 NOTE — Unmapped (Signed)
 Nathaniel Gibson has been contacted in regards to their refill of BIKTARVY  50-200-25 mg tablet (bictegrav-emtricit-tenofov ala). At this time, they have declined refill due to 2 weeks of medication. Refill assessment call date has been updated per the patient's request.

## 2023-12-26 MED FILL — LIDOCAINE 5 % TOPICAL PATCH: TRANSDERMAL | 30 days supply | Qty: 30 | Fill #2

## 2024-01-02 DIAGNOSIS — B20 Human immunodeficiency virus [HIV] disease: Principal | ICD-10-CM

## 2024-01-02 DIAGNOSIS — J449 Chronic obstructive pulmonary disease, unspecified: Principal | ICD-10-CM

## 2024-01-02 MED ORDER — BIKTARVY 50 MG-200 MG-25 MG TABLET
ORAL_TABLET | ORAL | 2 refills | 0.00000 days | Status: CP
Start: 2024-01-02 — End: ?
  Filled 2024-01-07: qty 30, 30d supply, fill #0

## 2024-01-02 MED ORDER — ALBUTEROL SULFATE HFA 90 MCG/ACTUATION AEROSOL INHALER
RESPIRATORY_TRACT | 0 refills | 34.00000 days | Status: CP | PRN
Start: 2024-01-02 — End: ?
  Filled 2024-01-07: qty 17, 34d supply, fill #0

## 2024-01-02 NOTE — Unmapped (Signed)
 Medication Requested: bictegrav-emtricit-tenofov ala (BIKTARVY ) 50-200-25 mg tablet       Future Appointments   Date Time Provider Department Center   03/05/2024  2:20 PM Servando Hire, MD [id number]   03/24/2024  1:30 PM Eilene Lauraine Norris, MD [id number]     Per Provider Note: HIV  Overall doing well. He never misses doses of Biktarvy  (BIC/FTC/TAF). Still using pill box.    Standing order protocol requirements met?: Yes    Sent to: Pharmacy per protocol    Days Supply Given: 30 days  Number of Refills: 2

## 2024-01-02 NOTE — Unmapped (Signed)
 Copied from CRM #[id number]. Topic: Access To Clinicians - Req Clinic Call Back  >> Jan 02, 2024  8:15 AM Donald BROCKS wrote:  Clinical Advice: New Symptom(s): Cough. Duration: 2 days       They have been seen for this issue previously.  The patient preferred contact: Cell Phone Telephone Information:  Mobile          [phone]  Mobile          [phone]   Routine callback turnaround time: 24-48 business hours. Programmer, systems Notified)

## 2024-01-02 NOTE — Unmapped (Signed)
 [facility] Special[facility] and Home Delivery Pharmacy Refill Coordination Note    Specialty Medication(s) to be Shipped:   Infectious Disease: Biktarvy     Other medication(s) to be shipped: Albuterol  HFA, Tramadol     Specialty Medications not needed at this time: N/A     Nathaniel Gibson, DOB: 17-May-1960  Phone: There are no phone numbers on file.      All above HIPAA information was verified with patient.     Was a Nurse, learning disability used for this call? No    Completed refill call assessment today to schedule patient's medication shipment from the [facility] Delivery Pharm[facility]  [phone]).  All relevant notes have been reviewed.     Specialty medication(s) and dose(s) confirmed: Regimen is correct and unchanged.   Changes to medications: [name] reports no changes at this time.  Changes to insurance: No  New side effects reported not previously addressed with a pharmacist or physician: None reported  Questions for the pharmacist: No    Confirmed patient received a Conservation officer, historic buildings and a Surveyor, mining with first shipment. The patient will receive a drug information handout for each medication shipped and additional FDA Medication Guides as required.       DISEASE/MEDICATION-SPECIFIC INFORMATION        N/A    SPECIALTY MEDICATION ADHERENCE     Medication Adherence    Patient reported X missed doses in the last month: 0  Specialty Medication: bictegrav-emtricit-tenofov ala (BIKTARVY ) 50-200-25 mg tablet  Patient is on additional specialty medications: No              Were doses missed due to medication being on hold? No    Biktarvy  50-200-25 mg: 5 days of medicine on hand        REFERRAL TO PHARMACIST     Referral to the pharmacist: Not needed      [facility]     Shipping address confirmed in Epic.     Cost and Payment: Patient has a $0 copay, payment information is not required.    Delivery Scheduled: Yes, Expected medication delivery date: 01/07/24.  However, Rx request for refills was sent to the provider as there are none remaining.     Medication will be delivered via Same Day Courier to the prescription address in Epic [place].    Nathaniel Gibson   [facility] and Home Delivery Pharmacy  Specialty Technician

## 2024-01-05 NOTE — Unmapped (Signed)
 Pls follow up to see if pt would still like to schedule

## 2024-01-07 MED FILL — TRAMADOL 50 MG TABLET: ORAL | 15 days supply | Qty: 120 | Fill #1

## 2024-01-07 NOTE — Unmapped (Signed)
 Lvm

## 2024-01-26 DIAGNOSIS — G8929 Other chronic pain: Principal | ICD-10-CM

## 2024-01-26 DIAGNOSIS — M25562 Pain in left knee: Principal | ICD-10-CM

## 2024-01-26 DIAGNOSIS — M25552 Pain in left hip: Principal | ICD-10-CM

## 2024-01-26 MED ORDER — TRAMADOL 50 MG TABLET
ORAL_TABLET | Freq: Four times a day (QID) | ORAL | 1 refills | 15.00000 days | PRN
Start: 2024-01-26 — End: 2024-02-25

## 2024-01-26 MED ORDER — LIDOCAINE 5 % TOPICAL PATCH
MEDICATED_PATCH | TRANSDERMAL | 2 refills | 30.00000 days
Start: 2024-01-26 — End: ?

## 2024-01-26 NOTE — Unmapped (Signed)
 [facility] Refill Coordination Note    Specialty Medication(s) to be Shipped:   Infectious Disease: Biktarvy     Other medication(s) to be shipped: Lidocaine   patch      Specialty Medications not needed at this time: N/A     Nathaniel Gibson, DOB: 1960/10/24  Phone: There are no phone numbers on file.      All above HIPAA information was verified with patient.     Was a Nurse, learning disability used for this call? No    Completed refill call assessment today to schedule patient's medication shipment from the [facility] Delivery [facility]  [phone]).  All relevant notes have been reviewed.     Specialty medication(s) and dose(s) confirmed: Regimen is correct and unchanged.   Changes to medications: Nathaniel Gibson reports no changes at this time.  Changes to insurance: No  New side effects reported not previously addressed with a pharmacist or physician: None reported  Questions for the pharmacist: No    Confirmed patient received a Conservation officer, historic buildings and a Surveyor, mining with first shipment. The patient will receive a drug information handout for each medication shipped and additional FDA Medication Guides as required.       DISEASE/MEDICATION-SPECIFIC INFORMATION        N/A    SPECIALTY MEDICATION ADHERENCE     Medication Adherence    Patient reported X missed doses in the last month: 0  Specialty Medication: bictegrav-emtricit-tenofov ala: BIKTARVY  50-200-25 mg tablet  Patient is on additional specialty medications: No  Patient is on more than two specialty medications: No  Any gaps in refill history greater than 2 weeks in the last 3 months: no  Demonstrates understanding of importance of adherence: yes              Were doses missed due to medication being on hold? No         BIKTARVY  50-200-25 mg tablet: 11  days of medicine on hand        REFERRAL TO PHARMACIST     Referral to the pharmacist: Not needed      [facility]     Shipping address confirmed in Epic.     Cost and Payment: Patient has a $0 copay, payment information is not required.    Delivery Scheduled: Yes, Expected medication delivery date: 01/30/24 .     Medication will be delivered via Same Day Courier to the prescription address in Epic WAM.    Tawni Daring   [facility]  Specialty Technician

## 2024-01-27 MED ORDER — TRAMADOL 50 MG TABLET
ORAL_TABLET | Freq: Four times a day (QID) | ORAL | 1 refills | 15.00000 days | Status: CP | PRN
Start: 2024-01-27 — End: 2024-02-26
  Filled 2024-01-30: qty 120, 15d supply, fill #0

## 2024-01-27 MED ORDER — LIDOCAINE 5 % TOPICAL PATCH
MEDICATED_PATCH | TRANSDERMAL | 2 refills | 30.00000 days | Status: CP
Start: 2024-01-27 — End: ?
  Filled 2024-01-30: qty 30, 30d supply, fill #0

## 2024-01-27 NOTE — Unmapped (Signed)
 Unable to complete refill request, medication is not included in the refill protocol.  and due to duplicate therapy, please advise on how to proceed.  Please advise on how to proceed.    Patient is requesting the following refill  Requested Prescriptions     Pending Prescriptions Disp Refills    lidocaine  (LIDODERM ) 5 % patch 30 patch 2     Sig: Place 1 patch on the affected area for 12 hours only each day (then remove patch) as directed       Recent Visits  Date Type Provider Dept   11/06/23 Office Visit Servando Hire, MD [facility] [address] [facility]   07/17/23 Office Visit Servando Hire, MD [facility] [address] [facility]   05/16/23 Office Visit Servando Hire, MD [facility] [address] [facility]   04/09/23 Office Visit Servando Hire, MD [facility] [address] [place]   03/13/23 Office Visit Servando Hire, MD [facility] [address] [place]   Showing recent visits within past 365 days and meeting all other requirements  Future Appointments  Date Type Provider Dept   03/05/24 Appointment Servando Hire, MD [facility] [address] [place]   Showing future appointments within next 365 days and meeting all other requirements       Labs: Not applicable this refill

## 2024-01-30 MED FILL — BIKTARVY 50 MG-200 MG-25 MG TABLET: ORAL | 30 days supply | Qty: 30 | Fill #1

## 2024-01-30 MED FILL — OZEMPIC 2 MG/DOSE (8 MG/3 ML) SUBCUTANEOUS PEN INJECTOR: SUBCUTANEOUS | 84 days supply | Qty: 9 | Fill #3

## 2024-02-02 NOTE — Unmapped (Signed)
 Copied from CRM #[id number]. Topic: Referral - New Referral Request  >> Feb 02, 2024  1:31 PM Damien BROCKS wrote:  The patient is requesting:    Referral: Pain Management -previous referral does not do medication management. Patient is requesting a referral to a location that prescribes medication     Reason for Request: medication management    Facility (Name, Location, & Phone/Fax): Patient/Guardian does not have a preference    Coverage: yes, coverage is accurate on file.    Preferred Contact: Cell Phone Telephone Information:  Mobile          [phone]  Mobile          [phone]      Routine callback turnaround time: 24-48 business hours. Programmer, systems Notified)

## 2024-02-02 NOTE — Unmapped (Signed)
 Pls advise, I think this has been the go to recently    I think it requires an external referral

## 2024-02-02 NOTE — Unmapped (Signed)
 Referral Services Note     Duration of Intervention: 15 minutes    TYPE OF CONTACT: Phone    ASSESSMENT: Patient reports not having a glucose tester for over one month. Patient appears motivated to obtain a new glucose meter. Review of records indicated that the provider previously sent in an order for a glucose testing kit last year, but the patient did not retrieve it and the order has since expired.  Nurse Alvina Alert, RN stated the clinic is unable to provide a glucose meter as the patient has to be uninsured and RW eligible.    INTERVENTION:  SW reviewed patient???s chart and identified a previous order that was not fulfilled. SW contacted patient???s provider to request a renewal of the expired glucose tester order.    PLAN:  SW will continue to monitor the provider???s response. Once the provider informs SW of the new order or advises on next steps, SW will promptly update the patient with this information.    Shabana Armentrout, LCSWA  [place] [facility]

## 2024-02-05 MED ORDER — BLOOD-GLUCOSE METER KIT WRAPPER
ORAL | 15 refills | 0.00000 days | Status: CP
Start: 2024-02-05 — End: ?

## 2024-02-16 NOTE — Unmapped (Signed)
 Referral Services Note     Duration of Intervention: 25 minutes    TYPE OF CONTACT: Phone    ASSESSMENT: Patient needed an update regarding glucose meter kit.    INTERVENTION:  SW informed patient that glucose meter kit was ordered through the specialty home delivery pharmacy. Patient stated he has not received the kit. SW provided patient with pharmacy number and encouraged patient to contact them for follow up. Patient stated he would.     Patient contacted SW to inform he spoke with the pharmacy regarding kit. Patient stated his provider needed to order items that were supposed to come with the kit. SW reached out to nursing to inquire about items.     PLAN:  SW will follow up with patient at a later date.     Gaylen Venning, LCSWA  [place] [facility]

## 2024-02-17 NOTE — Unmapped (Signed)
 Referral Services Note     Duration of Intervention: 15 minutes    TYPE OF CONTACT: Phone and Face to Face - In Person    ASSESSMENT: Nursing staff reported uncertainty regarding the additional items requested by the patient.     INTERVENTION: Child psychotherapist attempted to contact the patient to obtain clarification on the additional items needed; however, the patient did not respond to the outreach.    PLAN:  SW will follow up with patient at a later date.     Michalina Calbert, LCSWA  [place] [facility]

## 2024-02-20 MED ORDER — BLOOD GLUCOSE TEST STRIPS
Freq: Three times a day (TID) | 1 refills | 34.00000 days | Status: CP | PRN
Start: 2024-02-20 — End: ?
  Filled 2024-02-26: qty 1, 30d supply, fill #0

## 2024-02-20 MED ORDER — LANCETS
Freq: Three times a day (TID) | SUBCUTANEOUS | 0 refills | 0.00000 days | Status: CP | PRN
Start: 2024-02-20 — End: ?
  Filled 2024-02-26: qty 100, 34d supply, fill #0

## 2024-02-20 NOTE — Unmapped (Signed)
 Referral Services Note     Duration of Intervention: 20 minutes    TYPE OF CONTACT: Phone and Epic Chat with Provider(s): Alvina Alert, RN , Cheron Gaskins, PharmD    ASSESSMENT: Patient required clarification and assistance regarding access to additional supplies needed for the glucose meter kit.    INTERVENTION:  SW contacted Nurse Alvina Alert, RN, to inquire about available support. Nurse Jonah consulted with Pharmacist Aleck Gaskins, PharmD, who confirmed that additional supplies could be ordered to accompany the glucose meter kit. Pharmacist put in order for patient to receive supplies. SW then contacted the patient to share this information. The patient verbalized understanding of the update.    PLAN:  SW encouraged the patient to follow up with the pharmacy if there is no communication regarding the ordered supplies. The patient agreed to do so. SW will continue to provide support and follow up as needed.    Nathaniel Gibson, LCSWA  [place] [facility]

## 2024-02-24 DIAGNOSIS — I10 Essential (primary) hypertension: Principal | ICD-10-CM

## 2024-02-24 DIAGNOSIS — J309 Allergic rhinitis, unspecified: Principal | ICD-10-CM

## 2024-02-24 DIAGNOSIS — J449 Chronic obstructive pulmonary disease, unspecified: Principal | ICD-10-CM

## 2024-02-24 MED ORDER — ALBUTEROL SULFATE HFA 90 MCG/ACTUATION AEROSOL INHALER
RESPIRATORY_TRACT | 0 refills | 34.00000 days | PRN
Start: 2024-02-24 — End: ?

## 2024-02-24 MED ORDER — CLONIDINE HCL 0.1 MG TABLET
ORAL_TABLET | Freq: Every evening | ORAL | 3 refills | 90.00000 days
Start: 2024-02-24 — End: ?

## 2024-02-24 MED ORDER — LISINOPRIL 20 MG TABLET
ORAL_TABLET | 3 refills | 0.00000 days
Start: 2024-02-24 — End: ?

## 2024-02-24 MED ORDER — FLUTICASONE PROPIONATE 50 MCG/ACTUATION NASAL SPRAY,SUSPENSION
Freq: Every day | NASAL | 11 refills | 60.00000 days
Start: 2024-02-24 — End: ?

## 2024-02-24 NOTE — Unmapped (Signed)
 [facility] Delivery Pharm[facility] Clinical Assessment & Refill Coordination Note    Nathaniel Gibson, DOB: 04/18/1961  Phone: There are no phone numbers on file.    All above HIPAA information was verified with patient.     Was a Nurse, learning disability used for this call? No    Specialty Medication(s):   Infectious Disease: Biktarvy      Current Medications[1]     Changes to medications: Vint reports no changes at this time.    Medication list has been reviewed and updated in Epic: Yes    Allergies[2]    Changes to allergies: No    Allergies have been reviewed and updated in Epic: Yes    SPECIALTY MEDICATION ADHERENCE     Biktarvy  50-200-25 mg: ~7 days of medicine on hand       Medication Adherence    Patient reported X missed doses in the last month: 0  Specialty Medication: Biktarvy  50-200-25mg  QD  Patient is on additional specialty medications: No  Informant: patient          Specialty medication(s) dose(s) confirmed: Regimen is correct and unchanged.     Are there any concerns with adherence? No    Adherence counseling provided? Not needed    CLINICAL MANAGEMENT AND INTERVENTION      Clinical Benefit Assessment:    Do you feel the medicine is effective or helping your condition? Patient declined to answer    Clinical Benefit counseling provided? Labs from 10/2023 show evidence of clinical benefit    Adverse Effects Assessment:    Are you experiencing any side effects? No    Are you experiencing difficulty administering your medicine? No    Quality of Life Assessment:    Quality of Life    Rheumatology  Oncology  Dermatology  Cystic Fibrosis          How many days over the past month did your HIV  keep you from your normal activities? For example, brushing your teeth or getting up in the morning. Patient declined to answer    Have you discussed this with your provider? Not needed    Acute Infection Status:    Acute infections noted within Epic:  No active infections    Patient reported infection: None    Therapy Appropriateness:    Is the medication and dose appropriate considering the patient???s diagnosis, treatment, and disease journey, comorbidities, medical history, current medications, allergies, therapeutic goals, self-administration ability, and access barriers? Yes, therapy is appropriate and should be continued     Clinical Intervention:    Was an intervention completed as part of this clinical assessment? No    DISEASE/MEDICATION-SPECIFIC INFORMATION      N/A    HIV: Not Applicable    HIV ASSOCIATED LABS:     Lab Results   Component Value Date/Time    HIVRS Not Detected 10/29/2023 03:13 PM    HIVRS Detected (A) 04/24/2023 02:21 PM    HIVRS Not Detected 07/03/2022 02:14 PM    HIVCP <20 (H) 04/24/2023 02:21 PM    HIVCP <20 (H) 07/25/2021 03:43 PM    HIVCP <40 (H) 03/28/2021 02:28 PM    ACD4 1,484 04/24/2023 02:21 PM    ACD4 1,568 12/26/2021 02:13 PM    ACD4 1,400 03/28/2021 02:28 PM    HIV See Comment for Test Results (A) 04/16/2017 10:29 AM    HIV Nonreactive 01/03/2014 02:31 PM         PATIENT SPECIFIC NEEDS  Does the patient have any physical, cognitive, or cultural barriers? No    Is the patient high risk? No    Does the patient require physician intervention or other additional services (i.e., nutrition, smoking cessation, social work)? No    Does the patient have an additional or emergency contact listed in their chart? Yes    SOCIAL DETERMINANTS OF HEALTH     At the [facility], we have learned that life circumstances - like trouble affording food, housing, utilities, or transportation can affect the health of many of our patients.   That is why we wanted to ask: are you currently experiencing any life circumstances that are negatively impacting your health and/or quality of life? Patient declined to answer    Social Drivers of Health     Food Insecurity: No Food Insecurity (10/29/2023)    Hunger Vital Sign     Worried About Running Out of Food in the Last Year: Never true     Ran Out of Food in the Last Year: Never true   Tobacco Use: High Risk (12/12/2023)    Patient History     Smoking Tobacco Use: Every Day     Smokeless Tobacco Use: Never     Passive Exposure: Not on file   Transportation Needs: No Transportation Needs (10/29/2023)    PRAPARE - Transportation     Lack of Transportation (Medical): No     Lack of Transportation (Non-Medical): No   Alcohol Use: Not on file   Housing: Low Risk  (10/29/2023)    Housing     Within the past 12 months, have you ever stayed: outside, in a car, in a tent, in an overnight shelter, or temporarily in someone else's home (i.e. couch-surfing)?: No     Are you worried about losing your housing?: No   Physical Activity: Inactive (06/13/2017)    Received from [facility]    Exercise Vital Sign     Days of Exercise per Week: 0 days     Minutes of Exercise per Session: 0 min   Utilities: Low Risk  (10/29/2023)    Utilities     Within the past 12 months, have you been unable to get utilities (heat, electricity) when it was really needed?: No   Stress: Stress Concern Present (06/13/2017)    Received from [facility] - Occupational Stress Questionnaire     Feeling of Stress : Very much   Interpersonal Safety: Not At Risk (10/29/2023)    Interpersonal Safety     Unsafe Where You Currently Live: No     Physically Hurt by Anyone: No     Abused by Anyone: No   Substance Use: Not on file (03/11/2023)   Intimate Partner Violence: Not At Risk (06/13/2017)    Received from [facility]    Humiliation, Afraid, Rape, and Kick questionnaire     Fear of Current or Ex-Partner: No     Emotionally Abused: No     Physically Abused: No     Sexually Abused: No   Social Connections: Somewhat Isolated (06/13/2017)    Received from [facility]    Social Connection and Isolation Panel     Frequency of Communication with Friends and Family: More than three times a week     Frequency of Social Gatherings with Friends and Family: Once a week     Attends Religious Services: More than 4 times per year  Active Member of Clubs or Organizations: No     Attends Banker Meetings: Never     Marital Status: Never married   Physicist, medical Strain: Medium Risk (06/13/2017)    Received from [facility]    Overall Financial Resource Strain (CARDIA)     Difficulty of Paying Living Expenses: Somewhat hard   Health Literacy: Low Risk  (03/13/2023)    Health Literacy     : Never   Internet Connectivity: Not on file       Would you be willing to receive help with any of the needs that you have identified today? Not applicable       SHIPPING     Specialty Medication(s) to be Shipped:   Infectious Disease: Biktarvy     Other medication(s) to be shipped: Accu-Chek guide meter, strips, lancets, albuterol  HFA, atorvastatin , clonidine , flonase , lansoprazole , lidocaine  patches, lisinopril , metformin , tramadol     Specialty Medications not needed at this time: N/A     Changes to insurance: No    Cost and Payment: Patient has a $0 copay, payment information is not required.    Delivery Scheduled: Yes, Expected medication delivery date: 02/26/24.     Medication will be delivered via Same Day Courier to the confirmed prescription address in [facility].    The patient will receive a drug information handout for each medication shipped and additional FDA Medication Guides as required.  Verified that patient has previously received a Conservation officer, historic buildings and a Surveyor, mining.    The patient or caregiver noted above participated in the development of this care plan and knows that they can request review of or adjustments to the care plan at any time.      All of the patient's questions and concerns have been addressed.    Aleck CHRISTELLA Gaskins, PharmD   [facility] Specialty Pharmacist       [1]   Current Outpatient Medications   Medication Sig Dispense Refill    albuterol  HFA 90 mcg/actuation inhaler Inhale 2 puffs by mouth every four (4) hours as needed for wheezing. 17 g 0    atorvastatin  (LIPITOR) 40 MG tablet Take 1 tablet (40 mg) by mouth daily. 90 tablet 4    bictegrav-emtricit-tenofov ala (BIKTARVY ) 50-200-25 mg tablet Take 1 tablet by mouth daily. 30 tablet 2    cloNIDine  HCL (CATAPRES ) 0.1 MG tablet Take 1 tablet (0.1 mg total) by mouth nightly. 90 tablet 3    fluticasone  propionate (FLONASE ) 50 mcg/actuation nasal spray Use 2 sprays in each nostril daily. 16 g 11    lansoprazole  (PREVACID ) 30 MG capsule Take 1 capsule (30 mg total) by mouth daily. 90 capsule 3    lidocaine  (LIDODERM ) 5 % patch Place 1 patch on the affected area for 12 hours only each day (then remove patch) as directed 30 patch 2    lisinopril  (PRINIVIL ,ZESTRIL ) 20 MG tablet Take 1 tablet (20 mg) by mouth daily. 90 tablet 3    metFORMIN  (GLUCOPHAGE ) 500 MG tablet Take 1 tablet (500 mg) by mouth in the morning and in the evening with meals. 180 tablet 3    semaglutide  (OZEMPIC ) 2 mg/dose (8 mg/3 mL) PnIj Inject 2mg  under the skin every 7 days 9 mL 3    traMADol  (ULTRAM ) 50 mg tablet Take 2 tablets (100 mg total) by mouth every six (6) hours as needed for pain. 120 tablet 1    acetaminophen  (TYLENOL  8 HOUR) 650 MG CR tablet  Take 1 tablet (650 mg total) by mouth every eight (8) hours as needed for pain.      acetaminophen  (TYLENOL ) 500 MG tablet Take one tablet every 8 hours for post-surgical discomfort 40 tablet 1    amoxicillin  (AMOXIL ) 500 MG capsule Take one capsules three times daily for 5 days (Patient not taking: Reported on 02/24/2024) 15 capsule 1    blood sugar diagnostic (GLUCOSE BLOOD) Strp Use 1 strip to test blood sugar up to three (3) times a day as needed. 100 each 1    blood-glucose meter kit USE AS DIRECTED TO TEST BLOOD SUGAR 1 each 15    chlorhexidine  (PERIDEX ) 0.12 % solution Swish and spit 15ml for at least 1 minute two times per day (Patient not taking: Reported on 02/24/2024) 473 mL 0    cyclobenzaprine  (FLEXERIL ) 10 MG tablet Take 1 tablet (10 mg total) by mouth Three (3) times a day as needed. (Patient not taking: Reported on 02/24/2024) 60 tablet 2    dexAMETHasone  (DECADRON ) 4 MG tablet Take one tablet every 24 hours for the first two days after surgery (Patient not taking: Reported on 02/24/2024) 2 tablet 1    doxycycline  (VIBRAMYCIN ) 100 MG capsule Take 1 capsule (100 mg total) by mouth two (2) times a day. (Patient not taking: Reported on 02/24/2024) 20 capsule 0    ibuprofen  (MOTRIN ) 800 MG tablet Take one tablet every 8 hours for post-surgical discomfort. Take in-between intake of Tyelenol. (Patient not taking: Reported on 02/24/2024) 30 tablet 1    lancets Misc Use to test blood sugar up to three (3) times a day as needed for symptoms of high or low blood sugar. 100 each 0    multivitamin (THERAGRAN) per tablet Take 1 tablet by mouth daily.      nicotine  polacrilex (NICORETTE MINI) lozenge 4 MG Apply 1 lozenge (4 mg total) to cheek every four (4) hours as needed for smoking cessation. 72 each 2    QUEtiapine  (SEROQUEL ) 25 MG tablet Take 1 tablet (25 mg total) by mouth nightly.      sertraline  (ZOLOFT ) 100 MG tablet Take 2 tablets (200 mg total) by mouth daily. 180 tablet 3     Current Facility-Administered Medications   Medication Dose Route Frequency Provider Last Rate Last Admin    triamcinolone  acetonide (KENALOG -40) injection 20 mg  20 mg Intra-articular Once Berkoff, David Joseph, MD        triamcinolone  acetonide (KENALOG -40) injection 20 mg  20 mg Intra-articular Once Berkoff, David Joseph, MD       [2]   Allergies  Allergen Reactions    Naprosyn  [Naproxen ] Other (See Comments)     GI bleed

## 2024-02-25 MED ORDER — ALBUTEROL SULFATE HFA 90 MCG/ACTUATION AEROSOL INHALER
RESPIRATORY_TRACT | 0 refills | 34.00000 days | Status: CP | PRN
Start: 2024-02-25 — End: ?
  Filled 2024-02-26: qty 17, 34d supply, fill #0

## 2024-02-25 MED ORDER — CLONIDINE HCL 0.1 MG TABLET
ORAL_TABLET | Freq: Every evening | ORAL | 3 refills | 90.00000 days | Status: CP
Start: 2024-02-25 — End: ?
  Filled 2024-02-26: qty 90, 90d supply, fill #0

## 2024-02-25 MED ORDER — LISINOPRIL 20 MG TABLET
ORAL_TABLET | ORAL | 2 refills | 0.00000 days | Status: CP
Start: 2024-02-25 — End: ?
  Filled 2024-02-26: qty 90, 90d supply, fill #0

## 2024-02-25 NOTE — Unmapped (Deleted)
 Medication Requested: ***    {Verify that visits pulled are office visits with provider, not nurse visits:75688}  Future Appointments   Date Time Provider Department Center   03/05/2024  2:20 PM Servando Hire, MD [id number]   03/23/2024  3:50 PM Letta Alm Pac, MD [facility]   03/24/2024  1:30 PM Eilene Lauraine Norris, MD [facility]     Per Provider Note:   {Copy and paste in requested medication listed in provider note jancz:24311}  Standing order protocol requirements met?: {yesnosimple:52625}  {Only send to provider if standing order protocol not fzu:24311}  Sent to: {Provider or Pharmacy:82090}    Days Supply Given: {rxdayssupply:95293}  Number of Refills: {numberofrefills:95294}      {CARE PARTNERS - Please provider adequate number of refills to get to either next scheduled visit or next recall. For 90 day supply even if they have appt sooner than 90 days it is ok to authorize. Please be mindful of how many refills you are giving for 90 day supplies. For example, 1 fill plus 1 refill of 90 days is 6 months:75688}

## 2024-02-25 NOTE — Unmapped (Signed)
 Unable to complete refill request, medication is not included in the refill protocol.  and medication has been refilled within the last twelve months and requires provider review and approval.  Please advise on how to proceed.    Patient is requesting the following refill  Requested Prescriptions     Pending Prescriptions Disp Refills    cloNIDine  HCL (CATAPRES ) 0.1 MG tablet 90 tablet 3     Sig: Take 1 tablet (0.1 mg total) by mouth nightly.    albuterol  HFA 90 mcg/actuation inhaler 17 g 0     Sig: Inhale 2 puffs by mouth every four (4) hours as needed for wheezing.     Signed Prescriptions Disp Refills    lisinopril  (PRINIVIL ,ZESTRIL ) 20 MG tablet 90 tablet 2     Sig: Take 1 tablet (20 mg) by mouth daily.     Authorizing Provider: SERVANDO HIRE     Ordering User: LORELI SHERLEAN DEL       Recent Visits  Date Type Provider Dept   11/06/23 Office Visit SERVANDO HIRE, MD [facility] [address]   07/17/23 Office Visit SERVANDO HIRE, MD [facility] [address] [facility]   05/16/23 Office Visit SERVANDO HIRE, MD [facility] [address] [facility]   04/09/23 Office Visit SERVANDO HIRE, MD [facility] [address] [place]   03/13/23 Office Visit SERVANDO HIRE, MD [facility] [address] [place]   Showing recent visits within past 365 days and meeting all other requirements  Future Appointments  Date Type Provider Dept   03/05/24 Appointment SERVANDO HIRE, MD [facility] [address] [place]   Showing future appointments within next 365 days and meeting all other requirements       Labs: Not applicable this refill

## 2024-02-26 MED ORDER — FLUTICASONE PROPIONATE 50 MCG/ACTUATION NASAL SPRAY,SUSPENSION
Freq: Every day | NASAL | 11 refills | 60.00000 days | Status: CP
Start: 2024-02-26 — End: ?
  Filled 2024-02-26: qty 16, 30d supply, fill #0

## 2024-02-26 MED FILL — BIKTARVY 50 MG-200 MG-25 MG TABLET: ORAL | 30 days supply | Qty: 30 | Fill #2

## 2024-02-26 MED FILL — METFORMIN 500 MG TABLET: ORAL | 90 days supply | Qty: 180 | Fill #3

## 2024-02-26 MED FILL — ATORVASTATIN 40 MG TABLET: ORAL | 90 days supply | Qty: 90 | Fill #4

## 2024-02-26 MED FILL — TRAMADOL 50 MG TABLET: ORAL | 15 days supply | Qty: 120 | Fill #1

## 2024-02-26 MED FILL — LIDOCAINE 5 % TOPICAL PATCH: TRANSDERMAL | 30 days supply | Qty: 30 | Fill #1

## 2024-02-26 MED FILL — LANSOPRAZOLE 30 MG CAPSULE,DELAYED RELEASE: ORAL | 90 days supply | Qty: 90 | Fill #3

## 2024-02-26 MED FILL — ACCU-CHEK GUIDE TEST STRIPS: 34 days supply | Qty: 100 | Fill #0

## 2024-03-05 DIAGNOSIS — J0100 Acute maxillary sinusitis, unspecified: Principal | ICD-10-CM

## 2024-03-05 DIAGNOSIS — R059 Cough, unspecified type: Principal | ICD-10-CM

## 2024-03-05 DIAGNOSIS — G8929 Other chronic pain: Principal | ICD-10-CM

## 2024-03-05 DIAGNOSIS — Z23 Encounter for immunization: Principal | ICD-10-CM

## 2024-03-05 DIAGNOSIS — K219 Gastro-esophageal reflux disease without esophagitis: Principal | ICD-10-CM

## 2024-03-05 MED ORDER — DOXYCYCLINE HYCLATE 100 MG CAPSULE
ORAL_CAPSULE | Freq: Two times a day (BID) | ORAL | 0 refills | 10.00000 days | Status: CP
Start: 2024-03-05 — End: ?
  Filled 2024-03-05: qty 20, 10d supply, fill #0

## 2024-03-05 MED ORDER — LIDOCAINE 5 % TOPICAL PATCH
MEDICATED_PATCH | TRANSDERMAL | 3 refills | 60.00000 days | Status: CP
Start: 2024-03-05 — End: ?
  Filled 2024-03-29: qty 30, 30d supply, fill #0

## 2024-03-05 MED ORDER — HYDROCODONE 10 MG-CHLORPHENIRAMINE 8 MG/5 ML ORAL SUSP EXTEND.REL 12HR
Freq: Two times a day (BID) | ORAL | 0 refills | 7.00000 days | Status: CP | PRN
Start: 2024-03-05 — End: ?
  Filled 2024-03-05: qty 70, 7d supply, fill #0

## 2024-03-05 MED ORDER — LANSOPRAZOLE 30 MG CAPSULE,DELAYED RELEASE
ORAL_CAPSULE | Freq: Every day | ORAL | 3 refills | 90.00000 days | Status: CP
Start: 2024-03-05 — End: ?

## 2024-03-05 NOTE — Progress Notes (Signed)
 Assessment and Plan:     Assessment & Plan  Cough and acute maxillary sinusitis  Recurrent cough with acute maxillary sinusitis.  - Prescribed doxycycline  100 mg twice daily for 10 days.  - Prescribed cough syrup.  - Advised to pick up medications at [facility] outpatient pharmacy in [place].    Chronic pain and osteoarthritis  Chronic pain managed with lidocaine  patches for hip and knee.  - Adjusted lidocaine  patch prescription to two patches daily.  - Sent prescription to [facility] pharmacy.    Gastroesophageal reflux disease (GERD)  GERD.   -stable            I personally spent 30 minutes face-to-face and non-face-to-face in the care of this patient, which includes all pre, intra, and post visit time (reviewing, evaluating and discussing pertinent records for patient's care) on the date of service.       Return in about 7 weeks (around 04/23/2024) for Recheck, cdm.    Subjective:     HPI: Nathaniel Gibson is a 63 y.o. male here for        History of Present Illness  Nathaniel Gibson is a 63 year old male who presents with a recurrence of severe cough.    He has experienced a recurrence of a severe cough that has persisted over the past week, describing it as 'trying to start up again'. He has been using Muconix tablets and an unspecified cough medicine, but these have not been effective. Previously, treatment with an antibiotic and a breathing treatment was effective in resolving his symptoms.    He is currently using lidocaine  patches for pain management, applying two patches daily, one on his hip and one on his knee, due to pain that sometimes radiates to the groin. The patches are effective, but he requires more than the prescribed amount to manage his symptoms adequately.    He has been using doxycycline  in the past for his cough, taking it twice a day for ten days, which was prescribed during a previous visit in January.    He has received both the flu and pneumonia vaccines recently. He also mentions a weight loss of twelve pounds since his last visit.    He plans to move to [place] after Christmas and is coordinating his medical care in preparation for this move.         ROS:   Review of Systems     Review of systems negative unless otherwise noted as per HPI.      The following portions of the patient's history were reviewed and updated as appropriate: allergies, current medications, past family history, past medical history, past social history, past surgical history and problem list.     Objective:     Vitals:    03/05/24 1421   BP: 116/77   Pulse: 63   Temp: 36.6 ??C (97.8 ??F)     Body mass index is 35.09 kg/m??.    Physical Exam  Vitals and nursing note reviewed.   Constitutional:       General: He is not in acute distress.     Appearance: Normal appearance. He is not ill-appearing, toxic-appearing or diaphoretic.   HENT:      Right Ear: Tympanic membrane normal.      Left Ear: Tympanic membrane normal.      Nose: Congestion present.      Right Sinus: Maxillary sinus tenderness present.      Left Sinus: Maxillary sinus tenderness present.  Cardiovascular:      Rate and Rhythm: Normal rate and regular rhythm.      Heart sounds: Normal heart sounds.   Pulmonary:      Effort: Pulmonary effort is normal. No respiratory distress.      Breath sounds: Normal breath sounds.   Musculoskeletal:      Cervical back: Neck supple.   Neurological:      Mental Status: He is alert.                Results             Allergies:     Naprosyn  [naproxen ]      THEODORO JUST, MD    [facility]:     Medication adherence and barriers to the treatment plan have been addressed. Opportunities to optimize healthy behaviors have been discussed. Patient / caregiver voiced understanding.

## 2024-03-05 NOTE — Progress Notes (Signed)
 Patient in clinic for office visit, FluLaval - 6 months and up Flu Vaccine vaccine administered per protocol,NCIR and/or EPIC chart reviewed and vaccine accuracy verified with teammates: 2025-2026 Flu vaccine season , patient eligible to receive vacstock: Private Vaccine, vaccine administered Left Deltoid, pt tolerated well, no s/s of a reaction noted, VIS given to patient     Patient in clinic for office visit, Prevnar 20 - Pneumococcal 20valent vaccine administered per protocol,NCIR and/or EPIC chart reviewed and vaccine accuracy verified with teammates: Abby Kala Solo, CMA, patient eligible to receive vacstock: Private Vaccine, vaccine administered Right Deltoid, pt tolerated well, no s/s of a reaction noted, VIS given to patient

## 2024-03-05 NOTE — Telephone Encounter (Unsigned)
 Copied from CRM [id number]. Topic: Access To Clinicians - Req Clinic Call Back  >> Mar 05, 2024  3:25 PM Justice S wrote:  The patient states he was just seen by Dr. Servando, [facility] has not received the cough medication that was being called in. Please call the patient at [phone]

## 2024-03-22 DIAGNOSIS — G8929 Other chronic pain: Principal | ICD-10-CM

## 2024-03-22 DIAGNOSIS — M25552 Pain in left hip: Principal | ICD-10-CM

## 2024-03-22 DIAGNOSIS — B20 Human immunodeficiency virus [HIV] disease: Principal | ICD-10-CM

## 2024-03-22 DIAGNOSIS — M25562 Pain in left knee: Principal | ICD-10-CM

## 2024-03-22 MED ORDER — BIKTARVY 50 MG-200 MG-25 MG TABLET
ORAL_TABLET | ORAL | 0 refills | 0.00000 days | Status: CP
Start: 2024-03-22 — End: ?
  Filled 2024-03-29: qty 30, 30d supply, fill #0

## 2024-03-22 MED ORDER — TRAMADOL 50 MG TABLET
ORAL_TABLET | Freq: Four times a day (QID) | ORAL | 1 refills | 15.00000 days | PRN
Start: 2024-03-22 — End: 2024-04-21

## 2024-03-22 NOTE — Telephone Encounter (Signed)
 Medication Requested: bictegrav-emtricit-tenofov ala (BIKTARVY ) 50-200-25 mg tablet       Future Appointments   Date Time Provider Department Center   03/23/2024  3:50 PM Letta Alm Pac, MD [facility]   03/24/2024  1:30 PM Eilene Lauraine Norris, MD [facility]   04/22/2024 11:00 AM Servando Hire, MD [id number]     Per Provider Note: Overall doing well. He never misses doses of Biktarvy  (BIC/FTC/TAF). Still using pill box.     Standing order protocol requirements met?: Yes    Sent to: Pharmacy per protocol    Days Supply Given: 30 days  Number of Refills: 0

## 2024-03-22 NOTE — Progress Notes (Signed)
 [facility] Refill Coordination Note    Specialty Medication(s) to be Shipped:   Infectious Disease: BIKTARVY  50-200-25 mg tablet (bictegrav-emtricit-tenofov ala)    Other medication(s) to be shipped: No additional medications requested for fill at this time    Specialty Medications not needed at this time: N/A     Nathaniel Gibson, DOB: Jun 18, 1960  Phone: There are no phone numbers on file.      All above HIPAA information was verified with patient.     Was a nurse, learning disability used for this call? No    Completed refill call assessment today to schedule patient's medication shipment from the [facility]  [phone]).  All relevant notes have been reviewed.     Specialty medication(s) and dose(s) confirmed: Regimen is correct and unchanged.   Changes to medications: Brick reports no changes at this time.  Changes to insurance: No  New side effects reported not previously addressed with a pharmacist or physician: None reported  Questions for the pharmacist: No    Confirmed patient received a Conservation Officer, Historic Buildings and a Surveyor, Mining with first shipment. The patient will receive a drug information handout for each medication shipped and additional FDA Medication Guides as required.       DISEASE/MEDICATION-SPECIFIC INFORMATION        N/A    SPECIALTY MEDICATION ADHERENCE     Medication Adherence    Patient reported X missed doses in the last month: 0  Specialty Medication: BIKTARVY  50-200-25 mg tablet (bictegrav-emtricit-tenofov ala)  Patient is on additional specialty medications: No  Informant: patient              Were doses missed due to medication being on hold? No    BIKTARVY  50-200-25 mg tablet (bictegrav-emtricit-tenofov ala): 9 days of medicine on hand         REFERRAL TO PHARMACIST     Referral to the pharmacist: Not needed      [facility]     Shipping address confirmed in Epic.     Cost and Payment: Patient has a $0 copay, payment information is not required.    Delivery Scheduled: Yes, Expected medication delivery date: 11/25.  However, Rx request for refills was sent to the provider as there are none remaining.     Medication will be delivered via Next Day Courier to the prescription address in Epic WAM.    [facility]

## 2024-03-23 ENCOUNTER — Ambulatory Visit: Admit: 2024-03-23 | Payer: Medicare (Managed Care) | Attending: Emergency Medicine | Primary: Emergency Medicine

## 2024-03-23 MED ORDER — TRAMADOL 50 MG TABLET
ORAL_TABLET | Freq: Four times a day (QID) | ORAL | 1 refills | 15.00000 days | Status: CP | PRN
Start: 2024-03-23 — End: 2024-04-22
  Filled 2024-03-29: qty 120, 15d supply, fill #0

## 2024-03-24 NOTE — Progress Notes (Unsigned)
 Infectious Diseases Clinic        Assessment/Plan:     HIV  Overall doing well. He never misses doses of Biktarvy  (BIC/FTC/TAF). Still using pill box.    Accesses meds via through [facility].    CD4 counts have been over 300 for >2Y on suppressive ART.   CD4 rechecks can be annual.    Discussed ARV adherence.    Lab Results   Component Value Date    ACD4 1,484 04/24/2023    CD4 53 04/24/2023    HIVRS Not Detected 10/29/2023    HIVCP <20 (H) 04/24/2023       Continue current therapy  CD4, HIV RNA, and safety labs (full return panel)  Discussed importance of ARV adherence.    Rectal bleeding (pain resolved) - no complaints today  - h/o anal warts  - 7/21 pap negative for intraepithelial lesion or malignancy, referred for anoscopy given persistence of symptoms and history of warts, bleeding recurred in interim.  - no anal sex, no pain, rectal GC/Chlamydia and RPR all negative previously  - 10/19/20 anoscopy Squamocolumnar junctional mucosa with no evidence of intraepithelial or invasive neoplasia   - 12/2021 anal pap ASCUS   - Repeat Anal pap 10/29/23     Knee/hip pain - chronic stable, no new complaints  Gets injections in knee, which help.     Weight fluctuations/obesity  PCP started ozempic  in Feb 2024, now up to 2 mg weekly. Denies abdominal pain, diarrhea. Has gained ~20 pounds since March that he thinks is from eating more fast food recently. 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 No change on exam today    HTN/ T2DM  BP elevated to 150s/80s in office. Has been taking lisinopril  20 mg daily. Reports excess salt intake which he feels is contributing  - defer medication change to PCP whom he will see next week    Teeth extraction  - has had all teeth extracted, had dentures fitted but unfortunately lost them so will need to re-purchase which will is frustrating for him    Depression and anxiety  Patient reports stable mood to me. Had previously expressed interest in [facility] psych but not clear if he was able to establish  - mood remains stable on quetiapine  and sertraline     Food insecurity - chronic  Retired administrator, sports.   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 He started smoking about 49 years ago. He has a 49.9 pack-year smoking history. He has never used smokeless tobacco.  Still smoking 1 pack every 2 days. Counseled extensively on importance of quitting. Increased in setting of GodSon stress when he moved back in with patient. Not able to cut back at this point. Has tried nicotine  patches previously without success.     Sexual health & secondary prevention  Not sexually active, been 5+ years since sex  Since last visit, no sex and has not had add'l STI screening.   He does routinely discuss status with partner(s). Sometimes uses condoms.      Lab Results   Component Value Date    RPR Nonreactive 04/24/2023    CTNAA Negative 07/19/2020    CTNAA Negative 06/08/2019    CTNAA Negative 06/08/2019    CTNAA Negative 06/08/2019    GCNAA Negative 07/19/2020    GCNAA Negative 06/08/2019    GCNAA Negative 06/08/2019    GCNAA Negative 06/08/2019    SPECSOURCE Rectum 07/19/2020    SPECSOURCE Throat 06/08/2019    SPECSOURCE Urine 06/08/2019    SPECSOURCE Rectum 06/08/2019       GC/CT NAATs -- not being checked routinely for this patient  RPR -- not being checked routinely for this patient      Health maintenance    Wt Readings from Last 5 Encounters:   03/05/24 (!) 124 kg (273 lb 6.4 oz)   11/06/23 (!) 129.5 kg (285 lb 6.4 oz)   10/29/23 (!) 132 kg (291 lb)   07/17/23 (!) 125 kg (275 lb 9.6 oz)   07/14/23 (!) 122.9 kg (271 lb)     Oral health  He does not have a dentist. Last dental exam in past 6 months.  Awaiting denture fit    Eye health  He does  use corrective lenses. Last eye exam in past 12 months.     Metabolic conditions  Lab Results   Component Value Date    CREATININE 0.71 (L) 10/29/2023    GLUCOSEU Negative 11/03/2018    A1C 6.0 (H) 10/29/2023     # Diabetes - managed through primary care  # Kidney health - no clinical indication for UA  # Bone health - managed through primary care    Communicable diseases  Lab Results   Component Value Date    QFTTBGOLD Negative 05/09/2017    HEPCAB Nonreactive 02/17/2020    HCVRNA Not Detected 01/10/2021     # TB screening - no longer needed; negative IGRA, low risk  # HCV screening - negative Ab; rescreen w/Ab q1-2y    Cancer screening  Lab Results   Component Value Date    PSASCRN 0.9 03/24/2012    PSA 0.92 02/17/2020    FINALDX  12/26/2021     A. Anal pap, cytology:  - Specimen satisfactory for evaluation including the presence of rectoglandular epithelium  - Atypical squamous cells of undetermined significance (ASC-US )    This electronic signature is attestation that the pathologist personally reviewed the submitted material(s) and the final diagnosis reflects that evaluation.       # Breast - not applicable  # Cervical - not applicable    # Anorectal - repeat 10/2023  # Colorectal -  colonoscopy and egd for melana on 07/17/2017 at [facility] - findings w perinal/dre normal, sesile polyps in rectum. needs repeat surveillence in 21yrs (2024)  #  Liver - not applicable  # Lung - not yet done  # Prostate - not yet done; low risk    Cardiovascular disease  Lab Results   Component Value Date    CHOL 84 03/21/2022    HDL 32 (L) 03/21/2022    LDL 42 03/21/2022    NONHDL 52 (L) 03/21/2022    TRIG 52 03/21/2022     # The 10-year ASCVD risk score (Arnett DK, et al., 2019) is: 33.4%  - is  not taking aspirin (h/o GIB)  - is taking statin  - BP control good  - current smoker    Immunization History   Administered Date(s) Administered    COVID-19 VAC,BIVALENT,MODERNA(BLUE CAP) 07/30/2021    COVID-19 VACC,MRNA(BOOSTER)OR(6-77YR)MODERNA 10/19/2020    COVID-19 VACCINE,MRNA(MODERNA)(PF) 07/14/2019, 10/08/2019, 04/11/2020    Covid-19 Vac, (72yr+) (Comirnaty) Mrna Pfizer  04/24/2023    Covid-19 Vac, (101yr+) (Spikevax) Monovalent Moderna 03/18/2022    INFLUENZA VACCINE IIV3(IM)(PF)6 MOS UP 04/09/2023, 03/05/2024    Influenza Vaccine Quad(IM)6 MO-Adult(PF) 02/08/2013, 04/18/2015, 02/26/2016, 04/16/2017, 05/19/2018, 05/17/2019, 02/17/2020, 03/28/2021, 03/18/2022    Meningococcal Conjugate MCV4P 06/19/2020, 08/21/2020    PNEUMOCOCCAL POLYSACCHARIDE 23-VALENT 06/11/2013, 05/24/2017    Pneumococcal Conjugate 13-Valent 05/19/2018    Pneumococcal Conjugate 20-valent 03/05/2024    SHINGRIX-ZOSTER VACCINE (HZV),RECOMBINANT,ADJUVANTED(IM) 02/17/2020, 06/19/2020    TdaP 08/03/2013    ZOSTAVAX - ZOSTER VACCINE, LIVE, SQ 07/08/2014       Lab Results   Component Value Date    HEPAIGG Reactive (A) 05/09/2017    HEPBSAB Reactive (A) 05/09/2017     Check MMR serologies    Screening ordered today: A1c  Immunizations ordered today: none    DAIGNOSES and ORDERS for today's visit:    {No diagnosis found. (Refresh or delete this SmartLink)}      I personally spent 35 minutes face-to-face and non-face-to-face in the care of this patient, which includes all pre, intra, and post visit time on the date of service.  All documented time was specific to the E/M visit and does not include any procedures that may have been performed.    Pt seen and discussed with Dr. Tobie, PGY1    Disposition    Return to clinic 5-6 months or sooner if needed.    To do @ next RTC    Weight loss  [facility] access  Smoking cessation  COPD control - consider referral to pulm Chief Complaint   Follow-up HIV    HPI  In addition to details in A&P above:  Nathaniel Gibson is a 63 y.o. male who presents for HIV care. Newly Dx in late 2018; CD4 629/31% at time of Dx.    Has complex medical history - see notes by problem above.     HIV:   - no issues taking biktarvy . No missed doses. Using pill box.     Pain:  - persistent pain in hips and knees. Last got injections in both knees yesterday with Ortho. Will get hip injections     Weight Gain:  - has gained about 20 pounds since March. Thinks because has been eating out with his grandchildren more. His PCP recently increased ozempic  to 2 mg weekly on Saturday.     Substance use  - smoking a pack every 2 days. Has tried to stop in the past. Interested in quitting but has been challenging. Not intersted in starting other medicines.     HTN:   - does not have a blood pressure cuff at home. Says he can check when he picks up Biktarvy  at [facility]. Took his lisinopril  this morning.     God son is back in his life which has been great for his overall. He is graduating from college. They are planning to go to [place]  next week.     Says mood is pretty good. Thinks he has been taking sertraline .     Since last visit, presented to the ER 07/14/23 for COPD exacerbation and on 09/30/23 for diarrheal illness, both have since resolved.  Past Medical History:   Diagnosis Date    Acute gastric ulcer with hemorrhage 03/18/2022    Allergic rhinitis     Anemia 07/23/2017    Diabetes mellitus (CMS-HCC)     Disease of thyroid gland     Effusion of lower leg joint     GERD (gastroesophageal reflux disease)     High cholesterol     HIV (human immunodeficiency virus infection)    (CMS-HCC)     Hypertension     Localized, primary osteoarthritis of lower leg     Obesity, unspecified     Organic sleep apnea, unspecified     Pain in joint, multiple sites     Pterygium, unspecified     Smoker     Snores     Unspecified gingival and periodontal disease          Social History  Housing - in house in house with [employer] / Work - Not in school.  previously worked as administrator, sports    Tobacco - He reports that he has been smoking cigarettes. He started smoking about 49 years ago. He has a 49.9 pack-year smoking history. He has never used smokeless tobacco.   Drinking ever few weeks    Substance use - active marijuana       Review of Systems  As per HPI. All others negative.      Medications and Allergies  He has a current medication list which includes the following prescription(s): acetaminophen , acetaminophen , albuterol , atorvastatin , biktarvy , glucose blood, blood-glucose meter, chlorhexidine , clonidine  hcl, cyclobenzaprine , doxycycline , fluticasone  propionate, hydrocodone -chlorpheniramine polistirex, lancets, lansoprazole , lidocaine , lisinopril , metformin , multivitamin, nicotine  polacrilex, ozempic , sertraline , and tramadol , and the following Facility-Administered Medications: triamcinolone  acetonide and triamcinolone  acetonide.    Allergies: Naprosyn  [naproxen ]      Family History  His family history includes Cancer in his mother; Diabetes in his father and mother; Heart disease in his father; Hypertension in his mother; Kidney disease in his mother; No Known Problems in his brother, maternal aunt, maternal grandfather, maternal grandmother, maternal uncle, paternal aunt, paternal grandfather, paternal grandmother, paternal uncle, sister, and another family member.                   There were no vitals taken for this visit.     Alert appearing obese gentleman NAD, engaged, engages throughout our discussion  Sclerae anicteric, fleshy overgrowth appreciated R sclera and associated scleral injection, stable  Edentulous. Hyperpigmented lesion on lower lip <1cm, stable  External rectal exam wnl,   No obvious rashes  AOx3  Normal affect, good eye contact, fluent speech          [id number]

## 2024-04-20 DIAGNOSIS — B20 Human immunodeficiency virus [HIV] disease: Principal | ICD-10-CM

## 2024-04-20 MED ORDER — BIKTARVY 50 MG-200 MG-25 MG TABLET
ORAL_TABLET | 0 refills | 0.00000 days
Start: 2024-04-20 — End: ?

## 2024-04-21 MED ORDER — BIKTARVY 50 MG-200 MG-25 MG TABLET
ORAL_TABLET | ORAL | 0 refills | 0.00000 days | Status: CP
Start: 2024-04-21 — End: ?
  Filled 2024-05-05: qty 30, 30d supply, fill #0

## 2024-04-21 NOTE — Telephone Encounter (Signed)
 Medication Requested: bictegrav-emtricit-tenofov ala (BIKTARVY ) 50-200-25 mg tablet       Future Appointments   Date Time Provider Department Center   04/22/2024 11:00 AM Servando Hire, MD [id number]   04/28/2024  1:00 PM Eilene Lauraine Norris, MD [facility]     Per Provider Note: Overall doing well. He never misses doses of Biktarvy  (BIC/FTC/TAF). Still using pill box.     Standing order protocol requirements met?: Yes    Sent to: Pharmacy per protocol    Days Supply Given: 30 days  Number of Refills: 0

## 2024-04-28 NOTE — Progress Notes (Unsigned)
 Infectious Diseases Clinic        Assessment/Plan:     HIV  Overall doing well. He never misses doses of Biktarvy  (BIC/FTC/TAF). Still using pill box.    Accesses meds via through [facility].    CD4 counts have been over 300 for >2Y on suppressive ART.   CD4 rechecks can be annual.    Discussed ARV adherence.    Lab Results   Component Value Date    ACD4 1,484 04/24/2023    CD4 53 04/24/2023    HIVRS Not Detected 10/29/2023    HIVCP <20 (H) 04/24/2023       Continue current therapy  CD4, HIV RNA, and safety labs (full return panel)  Discussed importance of ARV adherence.    Rectal bleeding (pain resolved) - no complaints today  - h/o anal warts  - 7/21 pap negative for intraepithelial lesion or malignancy, referred for anoscopy given persistence of symptoms and history of warts, bleeding recurred in interim.  - no anal sex, no pain, rectal GC/Chlamydia and RPR all negative previously  - 10/19/20 anoscopy Squamocolumnar junctional mucosa with no evidence of intraepithelial or invasive neoplasia   - 12/2021 anal pap ASCUS   - Repeat Anal pap 10/29/23     Knee/hip pain - chronic stable, no new complaints  Gets injections in knee, which help.     Weight fluctuations/obesity  PCP started ozempic  in Feb 2024, now up to 2 mg weekly. Denies abdominal pain, diarrhea. Has gained ~20 pounds since March that he thinks is from eating more fast food recently. 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 No change on exam today    HTN/ T2DM  BP elevated to 150s/80s in office. Has been taking lisinopril  20 mg daily. Reports excess salt intake which he feels is contributing  - defer medication change to PCP whom he will see next week    Teeth extraction  - has had all teeth extracted, had dentures fitted but unfortunately lost them so will need to re-purchase which will is frustrating for him    Depression and anxiety  Patient reports stable mood to me. Had previously expressed interest in [facility] psych but not clear if he was able to establish  - mood remains stable on quetiapine  and sertraline     Food insecurity - chronic  Retired administrator, sports.   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 He started smoking about 50 years ago. He has a 50 pack-year smoking history. He has never used smokeless tobacco.  Still smoking 1 pack every 2 days. Counseled extensively on importance of quitting. Increased in setting of GodSon stress when he moved back in with patient. Not able to cut back at this point. Has tried nicotine  patches previously without success.     Sexual health & secondary prevention  Not sexually active, been 5+ years since sex  Since last visit, no sex and has not had add'l STI screening.   He does routinely discuss status with partner(s). Sometimes uses condoms.      Lab Results   Component Value Date    RPR Nonreactive 04/24/2023    CTNAA Negative 07/19/2020    CTNAA Negative 06/08/2019    CTNAA Negative 06/08/2019    CTNAA Negative 06/08/2019    GCNAA Negative 07/19/2020    GCNAA Negative 06/08/2019    GCNAA Negative 06/08/2019    GCNAA Negative 06/08/2019    SPECSOURCE Rectum 07/19/2020    SPECSOURCE Throat 06/08/2019    SPECSOURCE Urine 06/08/2019    SPECSOURCE Rectum 06/08/2019       GC/CT NAATs -- not being checked routinely for this patient  RPR -- not being checked routinely for this patient      Health maintenance    Wt Readings from Last 5 Encounters: 03/05/24 (!) 124 kg (273 lb 6.4 oz)   11/06/23 (!) 129.5 kg (285 lb 6.4 oz)   10/29/23 (!) 132 kg (291 lb)   07/17/23 (!) 125 kg (275 lb 9.6 oz)   07/14/23 (!) 122.9 kg (271 lb)     Oral health  He does not have a dentist. Last dental exam in past 6 months.  Awaiting denture fit    Eye health  He does  use corrective lenses. Last eye exam in past 12 months.     Metabolic conditions  Lab Results   Component Value Date    CREATININE 0.71 (L) 10/29/2023    GLUCOSEU Negative 11/03/2018    A1C 6.0 (H) 10/29/2023     # Diabetes - managed through primary care  # Kidney health - no clinical indication for UA  # Bone health - managed through primary care    Communicable diseases  Lab Results   Component Value Date    QFTTBGOLD Negative 05/09/2017    HEPCAB Nonreactive 02/17/2020    HCVRNA Not Detected 01/10/2021     # TB screening - no longer needed; negative IGRA, low risk  # HCV screening - negative Ab; rescreen w/Ab q1-2y    Cancer screening  Lab Results   Component Value Date    PSASCRN 0.9 03/24/2012    PSA 0.92 02/17/2020    FINALDX  12/26/2021     A. Anal pap, cytology:  - Specimen satisfactory for evaluation including the presence of rectoglandular epithelium  - Atypical squamous cells of undetermined significance (ASC-US )    This electronic signature is attestation that the pathologist personally reviewed the submitted material(s) and the final diagnosis reflects that evaluation.       # Breast - not applicable  # Cervical - not applicable    # Anorectal - repeat 10/2023  # Colorectal -  colonoscopy and egd for melana on 07/17/2017 at OSH - findings w perinal/dre normal, sesile polyps in rectum. needs repeat surveillence in 55yrs (2024)  #  Liver - not applicable  # Lung - not yet done  # Prostate - not yet done; low risk    Cardiovascular disease  Lab Results   Component Value Date    CHOL 84 03/21/2022    HDL 32 (L) 03/21/2022    LDL 42 03/21/2022    NONHDL 52 (L) 03/21/2022    TRIG 52 03/21/2022     # The 10-year ASCVD risk score (Arnett DK, et al., 2019) is: 33.4%  - is not taking  aspirin (h/o GIB)  - is taking statin  - BP control good  - current smoker    Immunization History   Administered Date(s) Administered    COVID-19 VAC,BIVALENT,MODERNA(BLUE CAP) 07/30/2021    COVID-19 VACC,MRNA(BOOSTER)OR(6-87YR)MODERNA 10/19/2020    COVID-19 VACCINE,MRNA(MODERNA)(PF) 07/14/2019, 10/08/2019, 04/11/2020    Covid-19 Vac, (7yr+) (Comirnaty) Mrna Pfizer  04/24/2023    Covid-19 Vac, (34yr+) (Spikevax) Monovalent Moderna 03/18/2022    INFLUENZA VACCINE IIV3(IM)(PF)6 MOS UP 04/09/2023, 03/05/2024    Influenza Vaccine Quad(IM)6 MO-Adult(PF) 02/08/2013, 04/18/2015, 02/26/2016, 04/16/2017, 05/19/2018, 05/17/2019, 02/17/2020, 03/28/2021, 03/18/2022    Meningococcal Conjugate MCV4P 06/19/2020, 08/21/2020    PNEUMOCOCCAL POLYSACCHARIDE 23-VALENT 06/11/2013, 05/24/2017    Pneumococcal Conjugate 13-Valent 05/19/2018    Pneumococcal Conjugate 20-valent 03/05/2024    SHINGRIX-ZOSTER VACCINE (HZV),RECOMBINANT,ADJUVANTED(IM) 02/17/2020, 06/19/2020    TdaP 08/03/2013    ZOSTAVAX - ZOSTER VACCINE, LIVE, SQ 07/08/2014       Lab Results   Component Value Date    HEPAIGG Reactive (A) 05/09/2017    HEPBSAB Reactive (A) 05/09/2017     Check MMR serologies    Screening ordered today: A1c  Immunizations ordered today: none    DAIGNOSES and ORDERS for today's visit:    {No diagnosis found. (Refresh or delete this SmartLink)}      I personally spent 35 minutes face-to-face and non-face-to-face in the care of this patient, which includes all pre, intra, and post visit time on the date of service.  All documented time was specific to the E/M visit and does not include any procedures that may have been performed.    Pt seen and discussed with Dr. Tobie, PGY1    Disposition    Return to clinic 5-6 months or sooner if needed.    To do @ next RTC    Weight loss  [facility] access  Smoking cessation  COPD control - consider referral to pulm                  Chief Complaint   Follow-up HIV    HPI  In addition to details in A&P above:  Nathaniel Gibson is a 63 y.o. male who presents for HIV care. Newly Dx in late 2018; CD4 629/31% at time of Dx.    Has complex medical history - see notes by problem above.     HIV:   - no issues taking biktarvy . No missed doses. Using pill box.     Pain:  - persistent pain in hips and knees. Last got injections in both knees yesterday with Ortho. Will get hip injections     Weight Gain:  - has gained about 20 pounds since March. Thinks because has been eating out with his grandchildren more. His PCP recently increased ozempic  to 2 mg weekly on Saturday.     Substance use  - smoking a pack every 2 days. Has tried to stop in the past. Interested in quitting but has been challenging. Not intersted in starting other medicines.     HTN:   - does not have a blood pressure cuff at home. Says he can check when he picks up Biktarvy  at [facility]. Took his lisinopril  this morning.     God son is back in his life which has been great for his overall. He is graduating from college. They are planning to go to [place]  next week.     Says mood is pretty good. Thinks he has been taking sertraline .     Since last visit, presented to the  ER 07/14/23 for COPD exacerbation and on 09/30/23 for diarrheal illness, both have since resolved.         Past Medical History:   Diagnosis Date    Acute gastric ulcer with hemorrhage 03/18/2022    Allergic rhinitis     Anemia 07/23/2017    Diabetes mellitus (CMS-HCC)     Disease of thyroid gland     Effusion of lower leg joint     GERD (gastroesophageal reflux disease)     High cholesterol     HIV (human immunodeficiency virus infection)    (CMS-HCC)     Hypertension     Localized, primary osteoarthritis of lower leg     Obesity, unspecified     Organic sleep apnea, unspecified     Pain in joint, multiple sites     Pterygium, unspecified     Smoker     Snores     Unspecified gingival and periodontal disease          Social History  Housing - in house in house with [employer] / Work - Not in school.  previously worked as administrator, sports    Tobacco - He reports that he has been smoking cigarettes. He started smoking about 50 years ago. He has a 50 pack-year smoking history. He has never used smokeless tobacco.   Drinking ever few weeks    Substance use - active marijuana       Review of Systems  As per HPI. All others negative.      Medications and Allergies  He has a current medication list which includes the following prescription(s): acetaminophen , acetaminophen , albuterol , atorvastatin , biktarvy , glucose blood, blood-glucose meter, chlorhexidine , clonidine  hcl, cyclobenzaprine , doxycycline , fluticasone  propionate, hydrocodone -chlorpheniramine  polistirex, lancets, lansoprazole , lidocaine , lisinopril , metformin , multivitamin, nicotine  polacrilex, ozempic , and sertraline , and the following Facility-Administered Medications: triamcinolone  acetonide and triamcinolone  acetonide.    Allergies: Naprosyn  [naproxen ]      Family History  His family history includes Cancer in his mother; Diabetes in his father and mother; Heart disease in his father; Hypertension in his mother; Kidney disease in his mother; No Known Problems in his brother, maternal aunt, maternal grandfather, maternal grandmother, maternal uncle, paternal aunt, paternal grandfather, paternal grandmother, paternal uncle, sister, and another family member.                   There were no vitals taken for this visit.     Alert appearing obese gentleman NAD, engaged, engages throughout our discussion  Sclerae anicteric, fleshy overgrowth appreciated R sclera and associated scleral injection, stable  Edentulous. Hyperpigmented lesion on lower lip <1cm, stable  External rectal exam wnl,   No obvious rashes  AOx3  Normal affect, good eye contact, fluent speech          [id number]

## 2024-05-03 DIAGNOSIS — J449 Chronic obstructive pulmonary disease, unspecified: Principal | ICD-10-CM

## 2024-05-03 MED ORDER — ALBUTEROL SULFATE HFA 90 MCG/ACTUATION AEROSOL INHALER
RESPIRATORY_TRACT | 0 refills | 34.00000 days | Status: CP | PRN
Start: 2024-05-03 — End: ?
  Filled 2024-05-05: qty 17, 34d supply, fill #0

## 2024-05-03 MED ORDER — OZEMPIC 2 MG/DOSE (8 MG/3 ML) SUBCUTANEOUS PEN INJECTOR
SUBCUTANEOUS | 0 refills | 0.00000 days | Status: CP
Start: 2024-05-03 — End: ?
  Filled 2024-05-05: qty 9, 84d supply, fill #0

## 2024-05-03 NOTE — Telephone Encounter (Signed)
 Patient is requesting the following refill  Requested Prescriptions     Pending Prescriptions Disp Refills    semaglutide  (OZEMPIC ) 2 mg/dose (8 mg/3 mL) PnIj 9 mL 3     Sig: Inject 2mg  under the skin every 7 days     Signed Prescriptions Disp Refills    albuterol  HFA 90 mcg/actuation inhaler 17 g 0     Sig: Inhale 2 puffs by mouth every four (4) hours as needed for wheezing.     Authorizing Provider: SERVANDO HIRE     Ordering User: SHLOMO ELYN HERO       Recent Visits  Date Type Provider Dept   03/05/24 Office Visit Servando Hire, MD [facility] [address]   11/06/23 Office Visit Servando Hire, MD [facility] [address] [facility]   07/17/23 Office Visit Servando Hire, MD [facility] [address] [facility]   05/16/23 Office Visit Servando Hire, MD [facility] [address] [place]   Showing recent visits within past 365 days and meeting all other requirements  Future Appointments  No visits were found meeting these conditions.  Showing future appointments within next 365 days and meeting all other requirements       Labs: A1c:   HGB A1C/POC (%)   Date Value   05/21/2012 9.3     Hemoglobin A1C (%)   Date Value   10/29/2023 6.0 (H)   03/13/2023 6.1 (A)

## 2024-05-03 NOTE — Progress Notes (Signed)
 [facility] Specialty and Home Delivery Pharmacy Refill Coordination Note    Specialty Medication(s) to be Shipped:   BIKTARVY  50-200-25 mg tablet (bictegrav-emtricit-tenofov ala)    Other medication(s) to be shipped: Test Strips, Albuterol , Fluticasone , Lidocaine , Ozempic , Tramadol     Specialty Medications not needed at this time: N/A     Nathaniel Gibson, DOB: [birth year]  Phone: There are no phone numbers on file.      All above HIPAA information was verified with patient.     Was a nurse, learning disability used for this call? No    Completed refill call assessment today to schedule patient's medication shipment from the [facility] and Home Delivery Pharmacy  [phone]).  All relevant notes have been reviewed.     Specialty medication(s) and dose(s) confirmed: Regimen is correct and unchanged.   Changes to medications: [name] reports no changes at this time.  Changes to insurance: No  New side effects reported not previously addressed with a pharmacist or physician: None reported  Questions for the pharmacist: No    Confirmed patient received a Conservation Officer, Historic Buildings and a Surveyor, Mining with first shipment. The patient will receive a drug information handout for each medication shipped and additional FDA Medication Guides as required.       DISEASE/MEDICATION-SPECIFIC INFORMATION        N/A    SPECIALTY MEDICATION ADHERENCE     Medication Adherence    Patient reported X missed doses in the last month: 0  Specialty Medication: BIKTARVY  50-200-25 mg tablet (bictegrav-emtricit-tenofov ala)  Patient is on additional specialty medications: No              Were doses missed due to medication being on hold? No    BIKTARVY  50-200-25 mg tablet (bictegrav-emtricit-tenofov ala): 9 days of medicine on hand     Specialty medication is an injection or given on a cycle: No    REFERRAL TO PHARMACIST     Referral to the pharmacist: Not needed      [facility]     Shipping address confirmed in Epic.     Cost and Payment: Patient has a $0 copay, payment information is not required.    Delivery Scheduled: Yes, Expected medication delivery date: 05/06/2024.     Medication will be delivered via Next Day Courier to the prescription address in Epic WAM.    Nathaniel Gibson   [facility] Specialty and Home Delivery Pharmacy  Specialty Technician

## 2024-05-05 MED FILL — LIDOCAINE 5 % TOPICAL PATCH: TRANSDERMAL | 30 days supply | Qty: 30 | Fill #1

## 2024-05-05 MED FILL — ACCU-CHEK GUIDE TEST STRIPS: 34 days supply | Qty: 100 | Fill #1

## 2024-05-05 MED FILL — TRAMADOL 50 MG TABLET: ORAL | 15 days supply | Qty: 120 | Fill #1

## 2024-05-05 MED FILL — FLUTICASONE PROPIONATE 50 MCG/ACTUATION NASAL SPRAY,SUSPENSION: NASAL | 90 days supply | Qty: 48 | Fill #1

## 2024-05-27 NOTE — Progress Notes (Signed)
Removed RW flag due to not renewing.     Nathaniel Gibson  ID Clinic Benefits Counselor  Time of Intervention-10mins

## 2024-05-31 DIAGNOSIS — B20 Human immunodeficiency virus [HIV] disease: Principal | ICD-10-CM

## 2024-05-31 MED ORDER — BIKTARVY 50 MG-200 MG-25 MG TABLET
ORAL_TABLET | 0 refills | 0.00000 days
Start: 2024-05-31 — End: ?

## 2024-05-31 MED ORDER — ACCU-CHEK GUIDE TEST STRIPS
Freq: Three times a day (TID) | 1 refills | 34.00000 days | PRN
Start: 2024-05-31 — End: ?

## 2024-05-31 NOTE — Telephone Encounter (Signed)
 Patient is will be receiving care outside of the country as they plan to move Feb. 2026 for HIV management and will not be routinely followed by the [facility] Infectious Diseases Clinic at [facility].     [facility] Care Plan: Out of Care Patient (>6 months)    Patient's last ID clinic appt: 10/29/2023    Goal: Patient will have an office visit within 6 months of the last office visit unless stated otherwise by their provider.     Contact Interventions may include:  Placing phone calls to the patient and/or listed contacts.  Sending the patient an Out of Care text message through the [employer] or [facility] cell phone (if texting agreement is signed).  Sending the patient an Out of Care MyChart/letter.  Contacting the patient's most recent pharmacies as needed for additional information such as contact information or refill history.  Contacting the patient's case manager/caregiver as needed for additional information, if assigned.  Researching other online resources as needed.     If the patient is reached, the [employer] will attempt to:  Update all contact information including primary and secondary phone numbers and emergency contact details and address. Confirm access to MyChart.  Complete the Out of Care Intervention questionnaire with the patient.  Explore and discuss any barriers to visit attendance the patient may be experiencing that result in missed appointments, no-shows, cancellations, or lack of a scheduled follow-up appointment. These may include communication barriers (language, phone access, texting, MyChart), transportation, financial concerns, time off work, mental health/substance use, and/or multiple medical appointments.  Make appropriate referrals and link the patient to clinic nurses, social workers, benefits counselors, or other support services in the [facility] ID clinic to address concerns or barriers to care.  Schedule a follow-up appointment at an appropriate interval based on a completed assessment.  Confirm the patient has access to ART medications until that appointment. If the patient is not currently taking ART, needs refills, or is having difficulty taking the medication, refer to the clinical staff for follow up via [employer].    Monitor attendance of the patient's scheduled appointment and provide reminder calls/texts/messages consistent with patient preference, if needed.  Continue follow-up if indicated.    Expected Outcome:  Patient will have an office visit within 6 months of the last office visit unless stated otherwise by their provider.    If the patient has an urgent medical problem send a chat message to the clinic nursing staff for immediate follow-up     **If the patient is not able to be located or retained in HIV care, a referral will be placed to the [facility] Counselor for further follow-up.    Duration of Service: 10 minutes    Nathaniel Gibson  Linkage and Insurance Claims Handler Infectious Diseases-[place]

## 2024-05-31 NOTE — Telephone Encounter (Signed)
 Medication Requested: Biktarvy       No future appointments.  Per Provider Note: Overall doing well. He never misses doses of Biktarvy  (BIC/FTC/TAF). Still using pill box     Standing order protocol requirements met?: No    Sent to: Provider for signing    Days Supply Given: TBD  Number of Refills: TBD

## 2024-05-31 NOTE — Progress Notes (Signed)
 [facility] Specialty and Home Delivery Pharmacy Refill Coordination Note    Specialty Medication(s) to be Shipped:   Infectious Disease: BIKTARVY  50-200-25 mg tablet (bictegrav-emtricit-tenofov ala)    Other medication(s) to be shipped: ACCU-CHEK GUIDE TEST STRIPS Strp (blood sugar diagnostic)    Specialty Medications not needed at this time: N/A     Nathaniel Gibson, DOB: [birth year]  Phone: There are no phone numbers on file.      All above HIPAA information was verified with patient.     Was a nurse, learning disability used for this call? No    Completed refill call assessment today to schedule patient's medication shipment from the [facility] and Home Delivery Pharmacy  [phone]).  All relevant notes have been reviewed.     Specialty medication(s) and dose(s) confirmed: Regimen is correct and unchanged.   Changes to medications: Nathaniel Gibson reports no changes at this time.  Changes to insurance: No  New side effects reported not previously addressed with a pharmacist or physician: None reported  Questions for the pharmacist: No    Confirmed patient received a Conservation Officer, Historic Buildings and a Surveyor, Mining with first shipment. The patient will receive a drug information handout for each medication shipped and additional FDA Medication Guides as required.       DISEASE/MEDICATION-SPECIFIC INFORMATION        N/A    SPECIALTY MEDICATION ADHERENCE     Medication Adherence    Patient reported X missed doses in the last month: 0  Specialty Medication: BIKTARVY  50-200-25 mg tablet (bictegrav-emtricit-tenofov ala)  Patient is on additional specialty medications: No  Informant: patient              Were doses missed due to medication being on hold? No    BIKTARVY  50-200-25 mg tablet (bictegrav-emtricit-tenofov ala) : 7 days of medicine on hand   hand     Specialty medication is an injection or given on a cycle: No    REFERRAL TO PHARMACIST     Referral to the pharmacist: Not needed      [facility]     Shipping address confirmed in Epic. Cost and Payment: Unable to determine copay at this time. Pt is aware copay may change with the first refills of the year     Delivery Scheduled: Yes, Expected medication delivery date: 1/30.  However, Rx request for refills was sent to the provider as there are none remaining.     Medication will be delivered via Next Day Courier to the prescription address in Epic WAM.    Nathaniel Gibson Specialty and [facility]

## 2024-06-01 MED ORDER — ACCU-CHEK GUIDE TEST STRIPS
Freq: Three times a day (TID) | 1 refills | 34.00000 days | Status: CP | PRN
Start: 2024-06-01 — End: ?
  Filled 2024-06-04: qty 100, 34d supply, fill #0

## 2024-06-01 MED ORDER — BIKTARVY 50 MG-200 MG-25 MG TABLET
ORAL_TABLET | ORAL | 0 refills | 0.00000 days | Status: CP
Start: 2024-06-01 — End: ?

## 2024-06-01 NOTE — Telephone Encounter (Signed)
 Hi Imani, it looks like Rosina rescheduled the patient today for April.    Thanks,  Nike

## 2024-06-01 NOTE — Telephone Encounter (Signed)
 Medication Requested: Accucheck Test Strips      Future Appointments   Date Time Provider Department Center   06/02/2024  9:00 AM Servando Hire, MD [id number]     Per Provider Note:     Standing order protocol requirements met?: No- Needs an appointment, has had two no shows    Sent to: Provider for signing    Days Supply Given: TBD  Number of Refills: TBD

## 2024-06-02 DIAGNOSIS — J449 Chronic obstructive pulmonary disease, unspecified: Secondary | ICD-10-CM

## 2024-06-02 DIAGNOSIS — G8929 Other chronic pain: Secondary | ICD-10-CM

## 2024-06-02 DIAGNOSIS — F331 Major depressive disorder, recurrent, moderate: Secondary | ICD-10-CM

## 2024-06-02 DIAGNOSIS — I10 Essential (primary) hypertension: Secondary | ICD-10-CM

## 2024-06-02 DIAGNOSIS — E119 Type 2 diabetes mellitus without complications: Secondary | ICD-10-CM

## 2024-06-02 DIAGNOSIS — K219 Gastro-esophageal reflux disease without esophagitis: Principal | ICD-10-CM

## 2024-06-02 DIAGNOSIS — E669 Obesity, unspecified: Secondary | ICD-10-CM

## 2024-06-02 MED ORDER — OZEMPIC 2 MG/DOSE (8 MG/3 ML) SUBCUTANEOUS PEN INJECTOR
SUBCUTANEOUS | 0 refills | 0.00000 days | Status: CP
Start: 2024-06-02 — End: ?

## 2024-06-02 MED ORDER — ALBUTEROL SULFATE HFA 90 MCG/ACTUATION AEROSOL INHALER
RESPIRATORY_TRACT | 0 refills | 34.00000 days | Status: CP | PRN
Start: 2024-06-02 — End: ?

## 2024-06-02 MED ORDER — LANSOPRAZOLE 30 MG CAPSULE,DELAYED RELEASE
ORAL_CAPSULE | Freq: Every day | ORAL | 3 refills | 90.00000 days | Status: CP
Start: 2024-06-02 — End: ?

## 2024-06-02 MED ORDER — METFORMIN 500 MG TABLET
ORAL_TABLET | ORAL | 3 refills | 0.00000 days | Status: CP
Start: 2024-06-02 — End: ?

## 2024-06-02 MED ORDER — ATORVASTATIN 40 MG TABLET
ORAL_TABLET | ORAL | 4 refills | 0.00000 days | Status: CP
Start: 2024-06-02 — End: ?

## 2024-06-02 MED ORDER — CLONIDINE HCL 0.1 MG TABLET
ORAL_TABLET | Freq: Every evening | ORAL | 3 refills | 90.00000 days | Status: CP
Start: 2024-06-02 — End: ?

## 2024-06-02 MED ORDER — SERTRALINE 100 MG TABLET
ORAL_TABLET | Freq: Every day | ORAL | 3 refills | 90.00000 days | Status: CP
Start: 2024-06-02 — End: ?

## 2024-06-02 MED ORDER — LISINOPRIL 20 MG TABLET
ORAL_TABLET | ORAL | 2 refills | 0.00000 days | Status: CP
Start: 2024-06-02 — End: ?

## 2024-06-02 MED ORDER — LIDOCAINE 5 % TOPICAL PATCH
MEDICATED_PATCH | TRANSDERMAL | 3 refills | 60.00000 days | Status: CP
Start: 2024-06-02 — End: ?

## 2024-06-03 DIAGNOSIS — B20 Human immunodeficiency virus [HIV] disease: Principal | ICD-10-CM

## 2024-06-03 NOTE — Progress Notes (Signed)
 Nathaniel Gibson 's Biktarvy  shipment will be delayed as a result of a high copay.     I have reached out to the patient  at [phone] and communicated the delay. We will call the patient back to reschedule the delivery upon resolution. We have not confirmed the new delivery date.

## 2024-06-03 NOTE — Progress Notes (Signed)
 Almond LELON Domino 's Biktarvy  shipment will be rescheduled as a result of copay is now approved by patient/caregiver.  $0    I have reached out to the patient  at [phone] and communicated the delivery change. We will reschedule the medication for the delivery date that the patient agreed upon.  We have confirmed the delivery date as 06/07/24, via next day courier.

## 2024-06-03 NOTE — Telephone Encounter (Signed)
 Patient needs a prescription for his Biktary for 3 months. He is leaving the country.

## 2024-06-03 NOTE — Progress Notes (Signed)
 Assessment and Plan:     Assessment & Plan  Medication management for chronic conditions  Requires a three-month supply of medications due to relocation to [facility]. Biktarvy  prescription managed by infectious disease specialist, with follow-up scheduled for April.  - Sent prescriptions for a three-month supply to [facility].  - Coordinated with infectious disease specialist for Biktarvy  prescription management.    Major depressive disorder, recurrent, moderate  Currently managed with sertraline  (Zoloft ).  - Continue sertraline  (Zoloft ) as prescribed.    Type 2 diabetes mellitus  Managed with Ozempic  and metformin . Ozempic  used for both diabetes management and weight loss.  - Continue Ozempic  weekly injections.  - Continue metformin  as prescribed.    Essential hypertension  Managed with lisinopril .  - Continue lisinopril  as prescribed.    Chronic obstructive pulmonary disease  Managed with albuterol  inhaler.  - Continue albuterol  inhaler as needed.    Chronic pain  Managed with lidocaine  patches and tramadol .  - Continue lidocaine  patches as needed.  - Continue tramadol  as needed.    Gastroesophageal reflux disease  Managed with Prevacid .  - Continue Prevacid  as prescribed.               I personally spent 30 minutes face-to-face and non-face-to-face in the care of this patient, which includes all pre, intra, and post visit time (reviewing, evaluating and discussing pertinent records for patient's care) on the date of service.       No follow-ups on file.    Subjective:     HPI: Nathaniel Gibson is a 64 y.o. male here for        History of Present Illness  Nathaniel Gibson is a 64 year old male who presents for medication refills as he is preparing to move to [place].    He is preparing to move to [facility] by February 16th and requires a three-month supply of his medications. He has arranged with his infectious disease specialist to receive Biktarvy , with a follow-up appointment scheduled for April, which he plans to attend by returning to the area.    His current medications include sertraline  (Zoloft ), Ozempic  (weekly injection on Mondays), metformin , lisinopril , Prevacid , clonidine  (as needed for sleep), a cholesterol medication (fibrostatin), albuterol  inhaler, lidocaine  patch, and tramadol . He is also on Biktarvy , which he receives from his infectious disease specialist.    The patient reports that a [employer] told him his benefits would be valid in [place], and he is seeking assistance in finding a local doctor there. His godson, who is retired from [employer] and sees a [place] doctor, may also assist in this regard.    He has previously visited [place] and enjoyed his stay, prompting his decision to move there permanently. He has a two-bedroom house arranged by his godson and plans to engage in gardening activities once settled.         ROS:   Review of Systems     Review of systems negative unless otherwise noted as per HPI.      The following portions of the patient's history were reviewed and updated as appropriate: allergies, current medications, past family history, past medical history, past social history, past surgical history and problem list.     Objective:     Vitals:    06/02/24 0907   BP: 133/78   Pulse: 76   Temp: 36.7 ??C (98.1 ??F)     Body mass index is 35.04 kg/m??.    Physical Exam  Vitals and nursing  note reviewed.   Constitutional:       General: He is not in acute distress.     Appearance: Normal appearance. He is not ill-appearing, toxic-appearing or diaphoretic.   Cardiovascular:      Rate and Rhythm: Normal rate and regular rhythm.      Heart sounds: Normal heart sounds.   Pulmonary:      Effort: Pulmonary effort is normal. No respiratory distress.      Breath sounds: Normal breath sounds. No stridor. No wheezing, rhonchi or rales.   Neurological:      Mental Status: He is alert.                Results             Allergies:     Naprosyn  [naproxen ]      THEODORO JUST, MD    [facility]: Medication adherence and barriers to the treatment plan have been addressed. Opportunities to optimize healthy behaviors have been discussed. Patient / caregiver voiced understanding.

## 2024-06-04 MED ORDER — BIKTARVY 50 MG-200 MG-25 MG TABLET
ORAL_TABLET | ORAL | 0 refills | 0.00000 days | Status: CP
Start: 2024-06-04 — End: ?
  Filled 2024-06-04: qty 30, 30d supply, fill #0

## 2024-06-04 NOTE — Telephone Encounter (Signed)
 Sent in a 90 day supply with no refills for Dr. Eilene as she is out of office. Has appt scheduled with her in April.
